# Patient Record
Sex: Female | Born: 1980 | Race: White | Hispanic: No | Marital: Married | Smoking: Never smoker
Health system: Southern US, Community
[De-identification: ages and names within clinical notes are randomized; demographics above are authoritative.]

## PROBLEM LIST (undated history)

## (undated) DIAGNOSIS — R002 Palpitations: Secondary | ICD-10-CM

## (undated) DIAGNOSIS — F419 Anxiety disorder, unspecified: Secondary | ICD-10-CM

## (undated) DIAGNOSIS — R609 Edema, unspecified: Secondary | ICD-10-CM

## (undated) DIAGNOSIS — Z91018 Allergy to other foods: Secondary | ICD-10-CM

## (undated) DIAGNOSIS — M26609 Unspecified temporomandibular joint disorder, unspecified side: Secondary | ICD-10-CM

## (undated) DIAGNOSIS — K219 Gastro-esophageal reflux disease without esophagitis: Secondary | ICD-10-CM

## (undated) DIAGNOSIS — K5900 Constipation, unspecified: Secondary | ICD-10-CM

## (undated) DIAGNOSIS — F32A Depression, unspecified: Secondary | ICD-10-CM

## (undated) DIAGNOSIS — F605 Obsessive-compulsive personality disorder: Secondary | ICD-10-CM

## (undated) DIAGNOSIS — Z6841 Body Mass Index (BMI) 40.0 and over, adult: Secondary | ICD-10-CM

## (undated) DIAGNOSIS — E559 Vitamin D deficiency, unspecified: Secondary | ICD-10-CM

## (undated) DIAGNOSIS — M222X9 Patellofemoral disorders, unspecified knee: Secondary | ICD-10-CM

## (undated) DIAGNOSIS — G43909 Migraine, unspecified, not intractable, without status migrainosus: Secondary | ICD-10-CM

## (undated) DIAGNOSIS — L811 Chloasma: Secondary | ICD-10-CM

## (undated) DIAGNOSIS — T7840XA Allergy, unspecified, initial encounter: Secondary | ICD-10-CM

## (undated) DIAGNOSIS — M722 Plantar fascial fibromatosis: Secondary | ICD-10-CM

## (undated) DIAGNOSIS — Z0000 Encounter for general adult medical examination without abnormal findings: Secondary | ICD-10-CM

## (undated) DIAGNOSIS — N189 Chronic kidney disease, unspecified: Secondary | ICD-10-CM

## (undated) DIAGNOSIS — E785 Hyperlipidemia, unspecified: Secondary | ICD-10-CM

## (undated) HISTORY — DX: Constipation, unspecified: K59.00

## (undated) HISTORY — DX: Encounter for general adult medical examination without abnormal findings: Z00.00

## (undated) HISTORY — DX: Depression, unspecified: F32.A

## (undated) HISTORY — DX: Chronic kidney disease, unspecified: N18.9

## (undated) HISTORY — DX: Obsessive-compulsive personality disorder: F60.5

## (undated) HISTORY — DX: Plantar fascial fibromatosis: M72.2

## (undated) HISTORY — DX: Anxiety disorder, unspecified: F41.9

## (undated) HISTORY — DX: Migraine, unspecified, not intractable, without status migrainosus: G43.909

## (undated) HISTORY — DX: Chloasma: L81.1

## (undated) HISTORY — DX: Morbid (severe) obesity due to excess calories: E66.01

## (undated) HISTORY — DX: Allergy to other foods: Z91.018

## (undated) HISTORY — DX: Palpitations: R00.2

## (undated) HISTORY — PX: TONSILLECTOMY: SUR1361

## (undated) HISTORY — DX: Gastro-esophageal reflux disease without esophagitis: K21.9

## (undated) HISTORY — DX: Allergy, unspecified, initial encounter: T78.40XA

## (undated) HISTORY — DX: Patellofemoral disorders, unspecified knee: M22.2X9

## (undated) HISTORY — DX: Vitamin D deficiency, unspecified: E55.9

## (undated) HISTORY — DX: Unspecified temporomandibular joint disorder, unspecified side: M26.609

## (undated) HISTORY — DX: Edema, unspecified: R60.9

## (undated) HISTORY — DX: Body Mass Index (BMI) 40.0 and over, adult: Z684

## (undated) HISTORY — PX: CERVICAL CONE BIOPSY: SUR198

## (undated) HISTORY — DX: Hyperlipidemia, unspecified: E78.5

---

## 2004-02-22 ENCOUNTER — Other Ambulatory Visit: Admission: RE | Admit: 2004-02-22 | Discharge: 2004-02-22 | Payer: Self-pay

## 2007-02-28 ENCOUNTER — Emergency Department (HOSPITAL_COMMUNITY): Admission: EM | Admit: 2007-02-28 | Discharge: 2007-02-28 | Payer: Self-pay | Admitting: Emergency Medicine

## 2008-05-13 ENCOUNTER — Encounter (INDEPENDENT_AMBULATORY_CARE_PROVIDER_SITE_OTHER): Payer: Self-pay | Admitting: Obstetrics and Gynecology

## 2008-05-13 ENCOUNTER — Ambulatory Visit (HOSPITAL_COMMUNITY): Admission: RE | Admit: 2008-05-13 | Discharge: 2008-05-13 | Payer: Self-pay | Admitting: Obstetrics and Gynecology

## 2008-06-20 ENCOUNTER — Emergency Department (HOSPITAL_COMMUNITY): Admission: EM | Admit: 2008-06-20 | Discharge: 2008-06-20 | Payer: Self-pay | Admitting: Emergency Medicine

## 2010-02-09 ENCOUNTER — Other Ambulatory Visit: Payer: Self-pay | Admitting: Obstetrics and Gynecology

## 2010-05-03 LAB — PREGNANCY, URINE: Preg Test, Ur: NEGATIVE

## 2010-05-03 LAB — URINALYSIS, ROUTINE W REFLEX MICROSCOPIC
Bilirubin Urine: NEGATIVE
Glucose, UA: NEGATIVE mg/dL
Hgb urine dipstick: NEGATIVE
Ketones, ur: NEGATIVE mg/dL
Nitrite: NEGATIVE
Protein, ur: NEGATIVE mg/dL
Specific Gravity, Urine: 1.015 (ref 1.005–1.030)
Urobilinogen, UA: 0.2 mg/dL (ref 0.0–1.0)
pH: 6.5 (ref 5.0–8.0)

## 2010-05-03 LAB — BASIC METABOLIC PANEL
BUN: 8 mg/dL (ref 6–23)
CO2: 25 mEq/L (ref 19–32)
Calcium: 8.8 mg/dL (ref 8.4–10.5)
Chloride: 103 mEq/L (ref 96–112)
Creatinine, Ser: 0.82 mg/dL (ref 0.4–1.2)
GFR calc Af Amer: >60 mL/min (ref >60)
GFR calc non Af Amer: >60 mL/min (ref >60)
Glucose, Bld: 97 mg/dL (ref 70–99)
Potassium: 4 mEq/L (ref 3.5–5.1)
Sodium: 137 mEq/L (ref 135–145)

## 2010-05-03 LAB — CBC
HCT: 40.6 % (ref 36.0–46.0)
Hemoglobin: 13.8 g/dL (ref 12.0–15.0)
MCHC: 34.1 g/dL (ref 30.0–36.0)
MCV: 85.9 fL (ref 78.0–100.0)
Platelets: 282 10*3/uL (ref 150–400)
RBC: 4.73 MIL/uL (ref 3.87–5.11)
RDW: 14 % (ref 11.5–15.5)
WBC: 7.7 10*3/uL (ref 4.0–10.5)

## 2010-05-03 LAB — PROTIME-INR
INR: 0.9 (ref 0.00–1.49)
Prothrombin Time: 12.4 seconds (ref 11.6–15.2)

## 2010-05-03 LAB — APTT: aPTT: 27 seconds (ref 24–37)

## 2010-06-06 NOTE — Op Note (Signed)
NAMEMarland Kitchen  KENNISHA, QIN NO.:  [id number]   MEDICAL RECORD NO.:  [id number]          PATIENT TYPE:  AMB   LOCATION:  SDC                           FACILITY:  [facility]   PHYSICIAN:  Miguel Aschoff, M.D.       DATE OF BIRTH:  1980-04-02   DATE OF PROCEDURE:  05/13/2008  DATE OF DISCHARGE:                               OPERATIVE REPORT   PREOPERATIVE DIAGNOSIS:  High-grade cervical intraepithelial lesion.   POSTOPERATIVE DIAGNOSIS:  High-grade cervical intraepithelial lesion.   PROCEDURE:  Cone biopsy and endocervical curettage.   SURGEON:  Miguel Aschoff, MD   ANESTHESIA:  General.   COMPLICATIONS:  None.   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  Because of this lesion, she presents now to undergo a  cone biopsy and endocervical curettage both the diagnostic and  therapeutic purposes.  The patient understands the risks and limitation  of this procedure and informed consent has been obtained.   PROCEDURE:  The patient was taken to the operating room and placed in  supine position.  General anesthesia was administered without  difficulty.  She was then placed in the dorsal lithotomy position,  prepped and draped in the usual sterile fashion.  Bladder was  catheterized.  Once this was done, a weighted speculum was placed in the  vaginal vault.  Anterior cervical lip was grasped with a tenaculum and  then figure-of-eight sutures of 0 chromic were placed from the 2 o'clock  and 4 o'clock positions and from the 8 o'clock 10 o'clock positions to  include the descending branch of the cervical arteries.  These were then  held for traction.  The cervix was then injected with 1% Xylocaine with  epinephrine for hemostasis.  After this was done, the cervix was stained  with Lugol's solution.  There was  diffuse uptake of Lugol's solution  throughout the cervix.  There were no leak areas noted, poor Lugol's  uptake.  At this point, using the knife, a cone was cut, excising the  endocervical canal in the portion of the portio, was tried to be kept  small in view of the fact that there was no ectocervical lesion was  seen.  After this was done, it was tacked at 12 o'clock position with a  single Vicryl suture.  The residual portion of the endocervical canal  was then curetted with a Kevorkian curet and sent as a separate  specimen.  At this point, the bed of the cone biopsy site was cauterized  with electrocautery as was the entire ectocervix and then the defect was  closed with a Gelfoam pack and then previously placed sutures were tied  across the cervix to hold the Gelfoam in place.  This completed the  procedure.  The estimated blood loss was minimal.  The patient tolerated  the procedure well and went to recovery room in satisfactory condition.   Plan is  for the patient to be discharged to home.   MEDICATIONS FOR HOME:  1. Darvocet-N 100 every 4 hours need for pain.  2. Doxycycline one twice a day x3 days.   She is to call for any problems such as fever, pain, or heavy bleeding.  She was instructed to have no sex for 4 weeks.  She is to call for  pathology report in 1 week.       Miguel Aschoff, M.D.  Electronically Signed     AR/MEDQ  D:  05/13/2008  T:  05/13/2008  Job:  [id number]

## 2010-08-24 ENCOUNTER — Other Ambulatory Visit: Payer: Self-pay | Admitting: Obstetrics and Gynecology

## 2010-10-13 LAB — DIFFERENTIAL
Basophils Absolute: 0
Basophils Relative: 0
Eosinophils Absolute: 0
Eosinophils Relative: 0
Lymphocytes Relative: 7 — ABNORMAL LOW
Lymphs Abs: 0.6 — ABNORMAL LOW
Monocytes Absolute: 1.1 — ABNORMAL HIGH
Monocytes Relative: 11
Neutro Abs: 8 — ABNORMAL HIGH
Neutrophils Relative %: 82 — ABNORMAL HIGH

## 2010-10-13 LAB — CBC
HCT: 42.4
Hemoglobin: 14.4
MCHC: 34
MCV: 84.5
Platelets: 283
RBC: 5.01
RDW: 13.7
WBC: 9.8

## 2010-10-13 LAB — I-STAT 8, (EC8 V) (CONVERTED LAB)
BUN: 9
Bicarbonate: 23
Chloride: 106
Glucose, Bld: 122 — ABNORMAL HIGH
HCT: 46
Hemoglobin: 15.6 — ABNORMAL HIGH
Operator id: 161631
Potassium: 4.5
Sodium: 134 — ABNORMAL LOW
TCO2: 24
pCO2, Ven: 33.6 — ABNORMAL LOW
pH, Ven: 7.443 — ABNORMAL HIGH

## 2010-10-13 LAB — URINALYSIS, ROUTINE W REFLEX MICROSCOPIC
Bilirubin Urine: NEGATIVE
Glucose, UA: NEGATIVE
Ketones, ur: 40 — AB
Nitrite: POSITIVE — AB
Protein, ur: NEGATIVE
Specific Gravity, Urine: 1.024
Urobilinogen, UA: 1
pH: 5.5

## 2010-10-13 LAB — URINE MICROSCOPIC-ADD ON

## 2010-10-13 LAB — POCT PREGNANCY, URINE
Operator id: 27065
Preg Test, Ur: NEGATIVE

## 2010-10-13 LAB — POCT I-STAT CREATININE
Creatinine, Ser: 1
Operator id: 161631

## 2012-03-16 ENCOUNTER — Encounter (HOSPITAL_BASED_OUTPATIENT_CLINIC_OR_DEPARTMENT_OTHER): Payer: Self-pay | Admitting: *Deleted

## 2012-03-16 ENCOUNTER — Emergency Department (HOSPITAL_BASED_OUTPATIENT_CLINIC_OR_DEPARTMENT_OTHER)
Admission: EM | Admit: 2012-03-16 | Discharge: 2012-03-16 | Disposition: A | Discharge status: Home / Self Care | Payer: Managed Care, Other (non HMO) | Attending: Emergency Medicine | Admitting: Emergency Medicine

## 2012-03-16 DIAGNOSIS — R112 Nausea with vomiting, unspecified: Secondary | ICD-10-CM | POA: Insufficient documentation

## 2012-03-16 DIAGNOSIS — R197 Diarrhea, unspecified: Secondary | ICD-10-CM | POA: Insufficient documentation

## 2012-03-16 DIAGNOSIS — R51 Headache: Secondary | ICD-10-CM | POA: Insufficient documentation

## 2012-03-16 DIAGNOSIS — R6883 Chills (without fever): Secondary | ICD-10-CM | POA: Insufficient documentation

## 2012-03-16 DIAGNOSIS — Z79899 Other long term (current) drug therapy: Secondary | ICD-10-CM | POA: Insufficient documentation

## 2012-03-16 MED ORDER — ONDANSETRON HCL 8 MG PO TABS
8.0000 mg | ORAL_TABLET | Freq: Once | ORAL | Status: AC
  Administered 2012-03-16: 8 mg via ORAL
  Filled 2012-03-16: qty 1

## 2012-03-16 MED ORDER — ONDANSETRON 8 MG PO TBDP: 8.0000 mg | ORAL_TABLET | Freq: Three times a day (TID) | ORAL | Status: DC | PRN

## 2012-03-16 NOTE — ED Provider Notes (Signed)
History  This chart was scribed for Hilario Quarry, MD by Shari Heritage, ED Scribe. The patient was seen in room MH06/MH06. Patient's care was started at 2130.   CSN: [id number]  Arrival date & time 03/16/12  2057   First MD Initiated Contact with Patient 03/16/12 2130      Chief Complaint  Patient presents with  . Emesis     Patient is a 32 y.o. female presenting with vomiting. The history is provided by the patient. No language interpreter was used.  Emesis Severity:  Mild Duration:  4 hours Number of daily episodes:  2 Quality:  Stomach contents Associated symptoms: chills, diarrhea and headaches   Associated symptoms: no fever      HPI Comments: Selena Flores is a 32 y.o. female who presents to the Emergency Department complaining of vomiting and diarrhea onset 2.5 hours ago. Patient states that she had 2 episodes of loose stool and 2 episodes of vomiting since 7 pm tonight. There is associated chills and mild headache. Patient denies cough, dysuria, hematuria, urinary frequency, vaginal discharge or abnormal vaginal bleeding. She reports some vaginal spotting this morning. Patient is currently taking Ortho-Cyclen birth control. She reports no pertinent past medical history. She has allergies to cipro and penicillins. Patient does not smoke.  History reviewed. No pertinent past medical history.  Past Surgical History  Procedure Laterality Date  . Tonsillectomy    . Cervical cone biopsy      No family history on file.  History  Substance Use Topics  . Smoking status: Never Smoker   . Smokeless tobacco: Not on file  . Alcohol Use: No     Comment: social     OB History   Grav Para Term Preterm Abortions TAB SAB Ect Mult Living                  Review of Systems  Constitutional: Positive for chills. Negative for fever.  Gastrointestinal: Positive for vomiting and diarrhea.  Genitourinary: Negative for dysuria, frequency, hematuria, vaginal bleeding and  vaginal discharge.  Neurological: Positive for headaches.  All other systems reviewed and are negative.    Allergies  Ciprofloxacin and Penicillins  Home Medications   Current Outpatient Rx  Name  Route  Sig  Dispense  Refill  . norgestimate-ethinyl estradiol (ORTHO-CYCLEN,SPRINTEC,PREVIFEM) 0.25-35 MG-MCG tablet   Oral   Take 1 tablet by mouth daily.         . sertraline (ZOLOFT) 100 MG tablet   Oral   Take 100 mg by mouth daily.           Triage Vitals: BP 105/73  Pulse 89  Temp(Src) 98 F (36.7 C)  Resp 18  Ht 5\' 4"  (1.626 m)  Wt 156 lb (70.761 kg)  BMI 26.76 kg/m2  SpO2 100%  LMP 02/24/2012  Physical Exam  Constitutional: She is oriented to person, place, and time. She appears well-developed and well-nourished. No distress.  HENT:  Head: Normocephalic and atraumatic.  Mouth/Throat: Oropharynx is clear and moist and mucous membranes are normal. Mucous membranes are not dry.  Eyes: Conjunctivae and EOM are normal. Pupils are equal, round, and reactive to light.  Neck: Normal range of motion. Neck supple.  Cardiovascular: Normal rate and regular rhythm.   Pulmonary/Chest: Effort normal and breath sounds normal.  Abdominal: Soft. She exhibits no distension and no mass. There is no tenderness.  Musculoskeletal: Normal range of motion.  Neurological: She is alert and oriented to person, place, and  time.  Skin: Skin is warm and dry. No rash noted.  Psychiatric: She has a normal mood and affect. Her behavior is normal.    ED Course  Procedures (including critical care time) DIAGNOSTIC STUDIES: Oxygen Saturation is 100% on room air, normal by my interpretation.    COORDINATION OF CARE: 9:40 PM- Patient informed of current plan for treatment and evaluation and agrees with plan at this time.    Labs Reviewed - No data to display  No results found.   1. Nausea vomiting and diarrhea      I personally performed the services described in this  documentation, which was scribed in my presence. The recorded information has been reviewed and considered.  MDM  Patient given zofran, no vomiting here.  Advised regarding n/v/d and instructed regarding clear liquids.   Hilario Quarry, MD 03/16/12 2245

## 2012-03-16 NOTE — ED Notes (Signed)
MD at bedside. 

## 2012-03-16 NOTE — ED Notes (Signed)
C/o vomiting and diarrhea since 7pm tonight. C/o chills. Fevers unknown. Denies any abd pain. C/o nausea.

## 2012-10-24 ENCOUNTER — Other Ambulatory Visit: Payer: Self-pay | Admitting: Obstetrics and Gynecology

## 2013-10-29 ENCOUNTER — Other Ambulatory Visit: Payer: Self-pay | Admitting: Obstetrics and Gynecology

## 2013-10-30 LAB — CYTOLOGY - PAP

## 2014-03-28 ENCOUNTER — Emergency Department (HOSPITAL_COMMUNITY)
Admission: EM | Admit: 2014-03-28 | Discharge: 2014-03-28 | Disposition: A | Discharge status: Home / Self Care | Payer: No Typology Code available for payment source | Attending: Emergency Medicine | Admitting: Emergency Medicine

## 2014-03-28 ENCOUNTER — Emergency Department (HOSPITAL_COMMUNITY): Payer: No Typology Code available for payment source

## 2014-03-28 ENCOUNTER — Encounter (HOSPITAL_COMMUNITY): Payer: Self-pay | Admitting: Emergency Medicine

## 2014-03-28 DIAGNOSIS — T07 Unspecified multiple injuries: Secondary | ICD-10-CM | POA: Insufficient documentation

## 2014-03-28 DIAGNOSIS — Y9389 Activity, other specified: Secondary | ICD-10-CM | POA: Insufficient documentation

## 2014-03-28 DIAGNOSIS — Y92410 Unspecified street and highway as the place of occurrence of the external cause: Secondary | ICD-10-CM | POA: Diagnosis not present

## 2014-03-28 DIAGNOSIS — S4991XA Unspecified injury of right shoulder and upper arm, initial encounter: Secondary | ICD-10-CM | POA: Insufficient documentation

## 2014-03-28 DIAGNOSIS — Z880 Allergy status to penicillin: Secondary | ICD-10-CM | POA: Diagnosis not present

## 2014-03-28 DIAGNOSIS — T07XXXA Unspecified multiple injuries, initial encounter: Secondary | ICD-10-CM

## 2014-03-28 DIAGNOSIS — Z79899 Other long term (current) drug therapy: Secondary | ICD-10-CM | POA: Diagnosis not present

## 2014-03-28 DIAGNOSIS — S161XXA Strain of muscle, fascia and tendon at neck level, initial encounter: Secondary | ICD-10-CM | POA: Diagnosis not present

## 2014-03-28 DIAGNOSIS — Y998 Other external cause status: Secondary | ICD-10-CM | POA: Diagnosis not present

## 2014-03-28 DIAGNOSIS — S8992XA Unspecified injury of left lower leg, initial encounter: Secondary | ICD-10-CM | POA: Insufficient documentation

## 2014-03-28 DIAGNOSIS — M79605 Pain in left leg: Secondary | ICD-10-CM

## 2014-03-28 DIAGNOSIS — M25511 Pain in right shoulder: Secondary | ICD-10-CM

## 2014-03-28 DIAGNOSIS — M542 Cervicalgia: Secondary | ICD-10-CM

## 2014-03-28 DIAGNOSIS — S199XXA Unspecified injury of neck, initial encounter: Secondary | ICD-10-CM | POA: Diagnosis present

## 2014-03-28 MED ORDER — HYDROCODONE-ACETAMINOPHEN 5-325 MG PO TABS: 1.0000 | ORAL_TABLET | Freq: Four times a day (QID) | ORAL | Status: DC | PRN

## 2014-03-28 MED ORDER — HYDROCODONE-ACETAMINOPHEN 5-325 MG PO TABS
2.0000 | ORAL_TABLET | Freq: Once | ORAL | Status: AC
  Administered 2014-03-28: 2 via ORAL
  Filled 2014-03-28: qty 2

## 2014-03-28 MED ORDER — METHOCARBAMOL 500 MG PO TABS
500.0000 mg | ORAL_TABLET | Freq: Once | ORAL | Status: AC
  Administered 2014-03-28: 500 mg via ORAL
  Filled 2014-03-28: qty 1

## 2014-03-28 MED ORDER — METHOCARBAMOL 500 MG PO TABS: 500.0000 mg | ORAL_TABLET | Freq: Two times a day (BID) | ORAL | Status: DC

## 2014-03-28 NOTE — ED Notes (Signed)
Pt denies dizziness/lightheadedness. Pt ambulated to BR with steady gait.
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
## 2014-03-28 NOTE — ED Notes (Signed)
Pt ambulated to BR with steady gait. Reported having some dizziness upon ambulation.
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
## 2014-03-28 NOTE — ED Notes (Addendum)
After pt was standing momentarily, she stated she felt like she was going to pass out. I grabbed her, she passed out, I lowered her to the ground. She woke up shortly after. Pt was able to get back in the bed. BP 80/40, laid pt down, BP came up to 97/58. States she feels a little better. Attempting to find the PA, alerted MD to situation.
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
## 2014-04-30 ENCOUNTER — Other Ambulatory Visit: Payer: Self-pay | Admitting: Obstetrics and Gynecology

## 2014-05-03 LAB — CYTOLOGY - PAP

## 2014-11-10 ENCOUNTER — Other Ambulatory Visit: Payer: Self-pay

## 2014-11-11 LAB — CYTOLOGY - PAP

## 2015-02-17 DIAGNOSIS — F419 Anxiety disorder, unspecified: Secondary | ICD-10-CM | POA: Insufficient documentation

## 2015-02-17 DIAGNOSIS — K219 Gastro-esophageal reflux disease without esophagitis: Secondary | ICD-10-CM | POA: Insufficient documentation

## 2015-02-17 DIAGNOSIS — F605 Obsessive-compulsive personality disorder: Secondary | ICD-10-CM

## 2015-02-17 DIAGNOSIS — M222X9 Patellofemoral disorders, unspecified knee: Secondary | ICD-10-CM

## 2015-02-17 DIAGNOSIS — Z6841 Body Mass Index (BMI) 40.0 and over, adult: Secondary | ICD-10-CM | POA: Insufficient documentation

## 2015-02-17 DIAGNOSIS — L811 Chloasma: Secondary | ICD-10-CM

## 2015-02-17 HISTORY — DX: Chloasma: L81.1

## 2015-02-17 HISTORY — DX: Patellofemoral disorders, unspecified knee: M22.2X9

## 2015-02-17 HISTORY — DX: Gastro-esophageal reflux disease without esophagitis: K21.9

## 2015-02-17 HISTORY — DX: Morbid (severe) obesity due to excess calories: E66.01

## 2015-02-17 HISTORY — DX: Anxiety disorder, unspecified: F41.9

## 2015-02-17 HISTORY — DX: Obsessive-compulsive personality disorder: F60.5

## 2015-04-06 ENCOUNTER — Encounter (HOSPITAL_COMMUNITY): Payer: Self-pay | Admitting: Emergency Medicine

## 2015-04-06 ENCOUNTER — Emergency Department (HOSPITAL_COMMUNITY)
Admission: EM | Admit: 2015-04-06 | Discharge: 2015-04-06 | Disposition: A | Discharge status: Home / Self Care | Payer: BLUE CROSS/BLUE SHIELD | Attending: Emergency Medicine | Admitting: Emergency Medicine

## 2015-04-06 DIAGNOSIS — R110 Nausea: Secondary | ICD-10-CM | POA: Diagnosis not present

## 2015-04-06 DIAGNOSIS — Z3202 Encounter for pregnancy test, result negative: Secondary | ICD-10-CM | POA: Insufficient documentation

## 2015-04-06 DIAGNOSIS — Z79818 Long term (current) use of other agents affecting estrogen receptors and estrogen levels: Secondary | ICD-10-CM | POA: Insufficient documentation

## 2015-04-06 DIAGNOSIS — Z79899 Other long term (current) drug therapy: Secondary | ICD-10-CM | POA: Diagnosis not present

## 2015-04-06 DIAGNOSIS — Z880 Allergy status to penicillin: Secondary | ICD-10-CM | POA: Diagnosis not present

## 2015-04-06 DIAGNOSIS — R1033 Periumbilical pain: Secondary | ICD-10-CM | POA: Diagnosis not present

## 2015-04-06 DIAGNOSIS — R197 Diarrhea, unspecified: Secondary | ICD-10-CM | POA: Insufficient documentation

## 2015-04-06 LAB — POC URINE PREG, ED: Preg Test, Ur: NEGATIVE

## 2015-04-06 LAB — COMPREHENSIVE METABOLIC PANEL
ALT: 13 U/L — ABNORMAL LOW (ref 14–54)
AST: 19 U/L (ref 15–41)
Albumin: 3.5 g/dL (ref 3.5–5.0)
Alkaline Phosphatase: 86 U/L (ref 38–126)
Anion gap: 10 (ref 5–15)
BUN: 9 mg/dL (ref 6–20)
CO2: 25 mmol/L (ref 22–32)
Calcium: 8.7 mg/dL — ABNORMAL LOW (ref 8.9–10.3)
Chloride: 102 mmol/L (ref 101–111)
Creatinine, Ser: 0.86 mg/dL (ref 0.44–1.00)
GFR calc Af Amer: >60 mL/min (ref >60)
GFR calc non Af Amer: >60 mL/min (ref >60)
Glucose, Bld: 106 mg/dL — ABNORMAL HIGH (ref 65–99)
Potassium: 3.5 mmol/L (ref 3.5–5.1)
Sodium: 137 mmol/L (ref 135–145)
Total Bilirubin: 0.7 mg/dL (ref 0.3–1.2)
Total Protein: 7.5 g/dL (ref 6.5–8.1)

## 2015-04-06 LAB — URINALYSIS, ROUTINE W REFLEX MICROSCOPIC
Glucose, UA: NEGATIVE mg/dL
Ketones, ur: 40 mg/dL — AB
Leukocytes, UA: NEGATIVE
Nitrite: NEGATIVE
Protein, ur: 30 mg/dL — AB
Specific Gravity, Urine: 1.02 (ref 1.005–1.030)
pH: 6.5 (ref 5.0–8.0)

## 2015-04-06 LAB — URINE MICROSCOPIC-ADD ON

## 2015-04-06 LAB — CBC
HCT: 46 % (ref 36.0–46.0)
Hemoglobin: 14.9 g/dL (ref 12.0–15.0)
MCH: 29.4 pg (ref 26.0–34.0)
MCHC: 32.4 g/dL (ref 30.0–36.0)
MCV: 90.7 fL (ref 78.0–100.0)
Platelets: 285 10*3/uL (ref 150–400)
RBC: 5.07 MIL/uL (ref 3.87–5.11)
RDW: 13.2 % (ref 11.5–15.5)
WBC: 10.5 10*3/uL (ref 4.0–10.5)

## 2015-04-06 LAB — LIPASE, BLOOD: Lipase: 23 U/L (ref 11–51)

## 2015-04-06 MED ORDER — ONDANSETRON HCL 4 MG PO TABS: 4.0000 mg | ORAL_TABLET | Freq: Three times a day (TID) | ORAL | Status: DC | PRN

## 2015-04-06 MED ORDER — ONDANSETRON HCL 4 MG/2ML IJ SOLN
4.0000 mg | Freq: Once | INTRAMUSCULAR | Status: AC
  Administered 2015-04-06: 4 mg via INTRAVENOUS
  Filled 2015-04-06: qty 2

## 2015-04-06 MED ORDER — SODIUM CHLORIDE 0.9 % IV BOLUS (SEPSIS)
1000.0000 mL | Freq: Once | INTRAVENOUS | Status: AC
  Administered 2015-04-06: 1000 mL via INTRAVENOUS

## 2015-04-06 MED ORDER — ONDANSETRON HCL 8 MG PO TABS: 8.0000 mg | ORAL_TABLET | Freq: Three times a day (TID) | ORAL | Status: DC | PRN

## 2015-04-06 NOTE — Discharge Instructions (Signed)
Diarrhea Diarrhea is frequent loose and watery bowel movements. It can cause you to feel weak and dehydrated. Dehydration can cause you to become tired and thirsty, have a dry mouth, and have decreased urination that often is dark yellow. Diarrhea is a sign of another problem, most often an infection that will not last long. In most cases, diarrhea typically lasts 2-3 days. However, it can last longer if it is a sign of something more serious. It is important to treat your diarrhea as directed by your caregiver to lessen or prevent future episodes of diarrhea. CAUSES  Some common causes include:  Gastrointestinal infections caused by viruses, bacteria, or parasites.  Food poisoning or food allergies.  Certain medicines, such as antibiotics, chemotherapy, and laxatives.  Artificial sweeteners and fructose.  Digestive disorders. HOME CARE INSTRUCTIONS  Ensure adequate fluid intake (hydration): Have 1 cup (8 oz) of fluid for each diarrhea episode. Avoid fluids that contain simple sugars or sports drinks, fruit juices, whole milk products, and sodas. Your urine should be clear or pale yellow if you are drinking enough fluids. Hydrate with an oral rehydration solution that you can purchase at pharmacies, retail stores, and online. You can prepare an oral rehydration solution at home by mixing the following ingredients together:   - tsp table salt.   tsp baking soda.   tsp salt substitute containing potassium chloride.  1  tablespoons sugar.  1 L (34 oz) of water.  Certain foods and beverages may increase the speed at which food moves through the gastrointestinal (GI) tract. These foods and beverages should be avoided and include:  Caffeinated and alcoholic beverages.  High-fiber foods, such as raw fruits and vegetables, nuts, seeds, and whole grain breads and cereals.  Foods and beverages sweetened with sugar alcohols, such as xylitol, sorbitol, and mannitol.  Some foods may be well  tolerated and may help thicken stool including:  Starchy foods, such as rice, toast, pasta, low-sugar cereal, oatmeal, grits, baked potatoes, crackers, and bagels.  Bananas.  Applesauce.  Add probiotic-rich foods to help increase healthy bacteria in the GI tract, such as yogurt and fermented milk products.  Wash your hands well after each diarrhea episode.  Only take over-the-counter or prescription medicines as directed by your caregiver.  Take a warm bath to relieve any burning or pain from frequent diarrhea episodes. SEEK IMMEDIATE MEDICAL CARE IF:   You are unable to keep fluids down.  You have persistent vomiting.  You have blood in your stool, or your stools are black and tarry.  You do not urinate in 6-8 hours, or there is only a small amount of very dark urine.  You have abdominal pain that increases or localizes.  You have weakness, dizziness, confusion, or light-headedness.  You have a severe headache.  Your diarrhea gets worse or does not get better.  You have a fever or persistent symptoms for more than 2-3 days.  You have a fever and your symptoms suddenly get worse. MAKE SURE YOU:   Understand these instructions.  Will watch your condition.  Will get help right away if you are not doing well or get worse.   This information is not intended to replace advice given to you by your health care provider. Make sure you discuss any questions you have with your health care provider.   Document Released: 12/29/2001 Document Revised: 01/29/2014 Document Reviewed: 09/16/2011 Elsevier Interactive Patient Education 2016 [employer]   Food Choices to Help Relieve Diarrhea, Adult When you have   diarrhea, the foods you eat and your eating habits are very important. Choosing the right foods and drinks can help relieve diarrhea. Also, because diarrhea can last up to 7 days, you need to replace lost fluids and electrolytes (such as sodium, potassium, and chloride) in  order to help prevent dehydration.  WHAT GENERAL GUIDELINES DO I NEED TO FOLLOW?  Slowly drink 1 cup (8 oz) of fluid for each episode of diarrhea. If you are getting enough fluid, your urine will be clear or pale yellow.  Eat starchy foods. Some good choices include white rice, white toast, pasta, low-fiber cereal, baked potatoes (without the skin), saltine crackers, and bagels.  Avoid large servings of any cooked vegetables.  Limit fruit to two servings per day. A serving is  cup or 1 small piece.  Choose foods with less than 2 g of fiber per serving.  Limit fats to less than 8 tsp (38 g) per day.  Avoid fried foods.  Eat foods that have probiotics in them. Probiotics can be found in certain dairy products.  Avoid foods and beverages that may increase the speed at which food moves through the stomach and intestines (gastrointestinal tract). Things to avoid include:  High-fiber foods, such as dried fruit, raw fruits and vegetables, nuts, seeds, and whole grain foods.  Spicy foods and high-fat foods.  Foods and beverages sweetened with high-fructose corn syrup, honey, or sugar alcohols such as xylitol, sorbitol, and mannitol. WHAT FOODS ARE RECOMMENDED? Grains White rice. White, French, or pita breads (fresh or toasted), including plain rolls, buns, or bagels. White pasta. Saltine, soda, or graham crackers. Pretzels. Low-fiber cereal. Cooked cereals made with water (such as cornmeal, farina, or cream cereals). Plain muffins. Matzo. Melba toast. Zwieback.  Vegetables Potatoes (without the skin). Strained tomato and vegetable juices. Most well-cooked and canned vegetables without seeds. Tender lettuce. Fruits Cooked or canned applesauce, apricots, cherries, fruit cocktail, grapefruit, peaches, pears, or plums. Fresh bananas, apples without skin, cherries, grapes, cantaloupe, grapefruit, peaches, oranges, or plums.  Meat and Other Protein Products Baked or boiled chicken. Eggs. Tofu.  Fish. Seafood. Smooth peanut butter. Ground or well-cooked tender beef, ham, veal, lamb, pork, or poultry.  Dairy Plain yogurt, kefir, and unsweetened liquid yogurt. Lactose-free milk, buttermilk, or soy milk. Plain hard cheese. Beverages Sport drinks. Clear broths. Diluted fruit juices (except prune). Regular, caffeine-free sodas such as ginger ale. Water. Decaffeinated teas. Oral rehydration solutions. Sugar-free beverages not sweetened with sugar alcohols. Other Bouillon, broth, or soups made from recommended foods.  The items listed above may not be a complete list of recommended foods or beverages. Contact your dietitian for more options. WHAT FOODS ARE NOT RECOMMENDED? Grains Whole grain, whole wheat, bran, or rye breads, rolls, pastas, crackers, and cereals. Wild or brown rice. Cereals that contain more than 2 g of fiber per serving. Corn tortillas or taco shells. Cooked or dry oatmeal. Granola. Popcorn. Vegetables Raw vegetables. Cabbage, broccoli, Brussels sprouts, artichokes, baked beans, beet greens, corn, kale, legumes, peas, sweet potatoes, and yams. Potato skins. Cooked spinach and cabbage. Fruits Dried fruit, including raisins and dates. Raw fruits. Stewed or dried prunes. Fresh apples with skin, apricots, mangoes, pears, raspberries, and strawberries.  Meat and Other Protein Products Chunky peanut butter. Nuts and seeds. Beans and lentils. Bacon.  Dairy High-fat cheeses. Milk, chocolate milk, and beverages made with milk, such as milk shakes. Cream. Ice cream. Sweets and Desserts Sweet rolls, doughnuts, and sweet breads. Pancakes and waffles. Fats and Oils Butter. Cream sauces.   Margarine. Salad oils. Plain salad dressings. Olives. Avocados.  Beverages Caffeinated beverages (such as coffee, tea, soda, or energy drinks). Alcoholic beverages. Fruit juices with pulp. Prune juice. Soft drinks sweetened with high-fructose corn syrup or sugar alcohols. Other Coconut. Hot sauce.  Chili powder. Mayonnaise. Gravy. Cream-based or milk-based soups.  The items listed above may not be a complete list of foods and beverages to avoid. Contact your dietitian for more information. WHAT SHOULD I DO IF I BECOME DEHYDRATED? Diarrhea can sometimes lead to dehydration. Signs of dehydration include dark urine and dry mouth and skin. If you think you are dehydrated, you should rehydrate with an oral rehydration solution. These solutions can be purchased at pharmacies, retail stores, or online.  Drink -1 cup (120-240 mL) of oral rehydration solution each time you have an episode of diarrhea. If drinking this amount makes your diarrhea worse, try drinking smaller amounts more often. For example, drink 1-3 tsp (5-15 mL) every 5-10 minutes.  A general rule for staying hydrated is to drink 1-2 L of fluid per day. Talk to your health care provider about the specific amount you should be drinking each day. Drink enough fluids to keep your urine clear or pale yellow.   This information is not intended to replace advice given to you by your health care provider. Make sure you discuss any questions you have with your health care provider.   Document Released: 03/31/2003 Document Revised: 01/29/2014 Document Reviewed: 12/01/2012 Elsevier Interactive Patient Education 2016 Elsevier Inc.   

## 2015-04-06 NOTE — ED Notes (Signed)
Per pt, [facility]-was diagnosed with a virus-yesterday states she was feeling a little better but by afternoon she was running a fever and diarrhea

## 2015-04-06 NOTE — ED Provider Notes (Signed)
CSN: [id number]     Arrival date & time 04/06/15  1036 History   First MD Initiated Contact with Patient 04/06/15 1507     Chief Complaint  Patient presents with  . Diarrhea     (Consider location/radiation/quality/duration/timing/severity/associated sxs/prior Treatment) HPI   Selena Flores is a 35 y.o. female who presents to the emergency room with 4 days of intermittent diarrhea with associated abdominal cramps, nausea and intermittent fever.  Her symptoms first began Saturday morning and minimally improved until Sunday night when it worsened. She estimates 4-5 bowel movements per day, but possibly up to 10 times per day. Stool is described as watery diarrhea, brown to light brown. She denies melena, hematochezia, rectal pain, tenesmus.  Her abdominal pain is intermittent, located in her central abdomen without radiation, rated 9 out of 10 at its worse, improved with lying down and improved after having bowel movements. She had fevers days ago, but none today.  She denies recent travel, suspicious food intake, sick contacts. She was seen at urgent care 2 days ago, was diagnosed with a GI virus. She has used Pepto-Bismol and Imodium with brief relief however a friend who is a Char[employer] fundraiserN told her that she may not want to use medication and she stopped. Her husband brought her to the ER today because of worsening abdominal cramps.  She denies dysuria, hematuria, urinary frequency or urgency. She denies any vaginal discharge, vaginal irritation or possibility of STDs. She denies flank pain, sweats, chills, vomiting, chest pain, shortness of breath, lightheadedness, headache.  She is on oral birth control, does not believe she can be pregnant, LMP estimated 03/23/2015.   No other acute complaints.  History reviewed. No pertinent past medical history. Past Surgical History  Procedure Laterality Date  . Tonsillectomy    . Cervical cone biopsy     No family history on file. Social History  Substance  Use Topics  . Smoking status: Never Smoker   . Smokeless tobacco: None  . Alcohol Use: No     Comment: social    OB History    No data available     Review of Systems  Constitutional: Negative for chills, diaphoresis, activity change, appetite change and fatigue.  HENT: Negative.   Respiratory: Negative.   Cardiovascular: Negative.   Gastrointestinal: Positive for nausea and diarrhea. Negative for vomiting, constipation, blood in stool, abdominal distention, anal bleeding and rectal pain.  Genitourinary: Negative.   Skin: Negative.  Negative for rash.  Neurological: Negative for dizziness, tremors, syncope, weakness and light-headedness.  All other systems reviewed and are negative.     Allergies  Ciprofloxacin; Rocephin; and Penicillins  Home Medications   Prior to Admission medications   Medication Sig Start Date End Date Taking? Authorizing Provider  acetaminophen (TYLENOL) 325 MG tablet Take 650 mg by mouth every 6 (six) hours as needed for fever.   Yes Historical Provider, MD  aspirin-acetaminophen-caffeine (EXCEDRIN MIGRAINE) [phone] MG per tablet Take 2 tablets by mouth every 6 (six) hours as needed for headache.   Yes Historical Provider, MD  bismuth subsalicylate (PEPTO BISMOL) 262 MG/15ML suspension Take 30 mLs by mouth every 6 (six) hours as needed for diarrhea or loose stools.   Yes Historical Provider, MD  loperamide (IMODIUM) 2 MG capsule Take 2-4 mg by mouth as needed for diarrhea or loose stools.   Yes Historical Provider, MD  Norgestimate-Ethinyl Estradiol Triphasic (TRI-SPRINTEC) 0.18/0.215/0.25 MG-35 MCG tablet Take 1 tablet by mouth daily.   Yes Historical Provider, MD  sertraline (ZOLOFT) 100 MG tablet Take 100 mg by mouth daily.   Yes Historical Provider, MD  ondansetron (ZOFRAN) 4 MG tablet Take 1 tablet (4 mg total) by mouth every 8 (eight) hours as needed for nausea or vomiting. 04/06/15   Danelle Berry, PA-C   BP 123/79 mmHg  Pulse 77  Temp(Src) 98.2  F (36.8 C) (Oral)  Resp 20  SpO2 99%  LMP 03/23/2015 Physical Exam  Constitutional: She is oriented to person, place, and time. She appears well-developed and well-nourished. No distress.  HENT:  Head: Normocephalic and atraumatic.  Nose: Nose normal.  Mouth/Throat: Oropharynx is clear and moist. No oropharyngeal exudate.  Eyes: Conjunctivae and EOM are normal. Pupils are equal, round, and reactive to light. Right eye exhibits no discharge. Left eye exhibits no discharge. No scleral icterus.  Neck: Normal range of motion. No JVD present. No tracheal deviation present. No thyromegaly present.  Cardiovascular: Normal rate, regular rhythm, normal heart sounds and intact distal pulses.  Exam reveals no gallop and no friction rub.   No murmur heard. Pulmonary/Chest: Effort normal and breath sounds normal. No respiratory distress. She has no wheezes. She has no rales. She exhibits no tenderness.  Abdominal: Soft. Bowel sounds are normal. She exhibits no distension and no mass. There is tenderness. There is no rebound and no guarding.  Periumbilical tenderness to palpation without rebound or guarding. No CVA tenderness  Musculoskeletal: Normal range of motion. She exhibits no edema or tenderness.  Lymphadenopathy:    She has no cervical adenopathy.  Neurological: She is alert and oriented to person, place, and time. She has normal reflexes. No cranial nerve deficit. She exhibits normal muscle tone. Coordination normal.  Skin: Skin is warm and dry. No rash noted. She is not diaphoretic. No erythema. No pallor.  Psychiatric: She has a normal mood and affect. Her behavior is normal. Judgment and thought content normal.  Nursing note and vitals reviewed.   ED Course  Procedures (including critical care time) Labs Review Labs Reviewed  COMPREHENSIVE METABOLIC PANEL - Abnormal; Notable for the following:    Glucose, Bld 106 (*)    Calcium 8.7 (*)    ALT 13 (*)    All other components within  normal limits  URINALYSIS, ROUTINE W REFLEX MICROSCOPIC (NOT AT [facility]) - Abnormal; Notable for the following:    Hgb urine dipstick SMALL (*)    Bilirubin Urine MODERATE (*)    Ketones, ur 40 (*)    Protein, ur 30 (*)    All other components within normal limits  URINE MICROSCOPIC-ADD ON - Abnormal; Notable for the following:    Squamous Epithelial / LPF 0-5 (*)    Bacteria, UA FEW (*)    All other components within normal limits  LIPASE, BLOOD  CBC  POC URINE PREG, ED    Imaging Review No results found. I have personally reviewed and evaluated these images and lab results as part of my medical decision-making.   EKG Interpretation None      MDM   Patient with 4 days of diarrhea, presented to the emergency room for worsening abdominal cramps today with concerns for dehydration.  She denies weakness, fatigue, near syncope, chest pain, shortness of breath.  Basic labs were obtained and resulted prior to my evaluation, labs unremarkable. Vital signs stable with mildly elevated heart rate 106.  She has been in the ER for approximately 5 hours and has been unable to provide a urine sample. We will give IV fluids,  obtain urinalysis and urine pregnancy test.  Do not suspect acute abdomen, pt is well appearing, abdomen soft, w/o peritoneal signs.  Anticipate D/C home after UA results. 3:48 PM  Pt's UA resulted, negative for UTI, with -nitrite, -leukocytes.  Pt received IVF and will be d/c home.  Return precautions reviewed.    Final diagnoses:  Diarrhea, unspecified type     Danelle Berry, PA-C 04/09/15 0119  Mancel Bale, MD 04/11/15 2148

## 2015-11-18 ENCOUNTER — Other Ambulatory Visit: Payer: Self-pay | Admitting: Obstetrics and Gynecology

## 2015-11-18 DIAGNOSIS — Z6841 Body Mass Index (BMI) 40.0 and over, adult: Secondary | ICD-10-CM | POA: Diagnosis not present

## 2015-11-18 DIAGNOSIS — Z124 Encounter for screening for malignant neoplasm of cervix: Secondary | ICD-10-CM | POA: Diagnosis not present

## 2015-11-18 DIAGNOSIS — Z01419 Encounter for gynecological examination (general) (routine) without abnormal findings: Secondary | ICD-10-CM | POA: Diagnosis not present

## 2015-11-21 LAB — CYTOLOGY - PAP

## 2016-04-10 DIAGNOSIS — Z0000 Encounter for general adult medical examination without abnormal findings: Secondary | ICD-10-CM | POA: Diagnosis not present

## 2016-04-12 DIAGNOSIS — F605 Obsessive-compulsive personality disorder: Secondary | ICD-10-CM | POA: Diagnosis not present

## 2016-04-12 DIAGNOSIS — Z6841 Body Mass Index (BMI) 40.0 and over, adult: Secondary | ICD-10-CM | POA: Diagnosis not present

## 2016-04-12 DIAGNOSIS — Z0000 Encounter for general adult medical examination without abnormal findings: Secondary | ICD-10-CM | POA: Diagnosis not present

## 2016-06-06 DIAGNOSIS — M7661 Achilles tendinitis, right leg: Secondary | ICD-10-CM | POA: Diagnosis not present

## 2016-06-06 DIAGNOSIS — M7731 Calcaneal spur, right foot: Secondary | ICD-10-CM | POA: Diagnosis not present

## 2017-02-07 DIAGNOSIS — Z01419 Encounter for gynecological examination (general) (routine) without abnormal findings: Secondary | ICD-10-CM | POA: Diagnosis not present

## 2017-02-07 DIAGNOSIS — Z124 Encounter for screening for malignant neoplasm of cervix: Secondary | ICD-10-CM | POA: Diagnosis not present

## 2017-02-07 DIAGNOSIS — R87610 Atypical squamous cells of undetermined significance on cytologic smear of cervix (ASC-US): Secondary | ICD-10-CM | POA: Diagnosis not present

## 2017-02-07 DIAGNOSIS — Z6841 Body Mass Index (BMI) 40.0 and over, adult: Secondary | ICD-10-CM | POA: Diagnosis not present

## 2017-05-28 ENCOUNTER — Telehealth: Payer: Self-pay | Admitting: General Practice

## 2017-05-28 ENCOUNTER — Telehealth: Payer: Self-pay | Admitting: Family Medicine

## 2017-05-28 NOTE — Telephone Encounter (Signed)
Please advise 

## 2017-05-28 NOTE — Telephone Encounter (Unsigned)
Copied from CRM [id number]. Topic: Quick Communication - See Telephone Encounter >> May 28, 2017  1:59 PM Selena Flores wrote: Pt is wanting to see dr Abner Greenspan and would like to know she would be able to take her own as a pt   [phone]

## 2017-05-28 NOTE — Telephone Encounter (Signed)
Called pt regarding her request to establish new care with our office.LVM informing her that we have four providers accepting new pts and advised to call back and schedule a new patient appt.

## 2017-05-28 NOTE — Telephone Encounter (Signed)
I willing to take her on as a patient

## 2017-05-30 NOTE — Telephone Encounter (Signed)
Called patient left vmail for her to call the office back.

## 2017-07-09 ENCOUNTER — Ambulatory Visit (INDEPENDENT_AMBULATORY_CARE_PROVIDER_SITE_OTHER): Payer: BLUE CROSS/BLUE SHIELD | Admitting: Family Medicine

## 2017-07-09 ENCOUNTER — Encounter: Payer: Self-pay | Admitting: Family Medicine

## 2017-07-09 DIAGNOSIS — E785 Hyperlipidemia, unspecified: Secondary | ICD-10-CM

## 2017-07-09 DIAGNOSIS — F419 Anxiety disorder, unspecified: Secondary | ICD-10-CM | POA: Diagnosis not present

## 2017-07-09 MED ORDER — SERTRALINE HCL 25 MG PO TABS: 25.0000 mg | ORAL_TABLET | Freq: Every day | ORAL | 1 refills | Status: DC

## 2017-07-09 NOTE — Progress Notes (Signed)
Subjective:  I acted as a Neurosurgeon for Dr. Abner Greenspan. [place], [place]  Patient ID: Selena Flores, female    DOB: [birth year], 37 y.o.   MRN: [id number]  No chief complaint on file.   HPI  Patient is in today to establish care and follow up on her chronic medical concerns including hyperlipidemia, anxiety and obesity. She feels well today. No recent febrile illness or hospitalizations. She takes the Sertraline for anxiety and she has dropped it to 50 mg daily and has done well  Her previous marriage was very stressful but her new relationship is much more supportive and she does not feel she needs the medication any longer. She is frustrated with her weight and has lost over 100 pounds previously. Denies CP/palp/SOB/HA/congestion/fevers/GI or GU c/o. Taking meds as prescribed  Patient Care Team: Bradd Canary, MD as PCP - General (Family Medicine)   Past Medical History:  Diagnosis Date  . Anxiety   . Chronic kidney disease    kidney infection 2008 ish  . Hyperlipidemia     Past Surgical History:  Procedure Laterality Date  . CERVICAL CONE BIOPSY    . TONSILLECTOMY      Family History  Problem Relation Age of Onset  . Heart disease Mother        chf  . Obesity Mother   . Skin cancer Father   . Cancer Father        skin cancer    Social History   Socioeconomic History  . Marital status: Divorced    Spouse name: Not on file  . Number of children: 0  . Years of education: Not on file  . Highest education level: Not on file  Occupational History  . Occupation: [employer]     Comment: Customer Service   Social Needs  . Financial resource strain: Not on file  . Food insecurity:    Worry: Not on file    Inability: Not on file  . Transportation needs:    Medical: Not on file    Non-medical: Not on file  Tobacco Use  . Smoking status: Never Smoker  . Smokeless tobacco: Never Used  Substance and Sexual Activity  . Alcohol use: Yes    Comment: social   . Drug  use: Yes  . Sexual activity: Yes  Lifestyle  . Physical activity:    Days per week: Not on file    Minutes per session: Not on file  . Stress: Not on file  Relationships  . Social connections:    Talks on phone: Not on file    Gets together: Not on file    Attends religious service: Not on file    Active member of club or organization: Not on file    Attends meetings of clubs or organizations: Not on file    Relationship status: Not on file  . Intimate partner violence:    Fear of current or ex partner: Not on file    Emotionally abused: Not on file    Physically abused: Not on file    Forced sexual activity: Not on file  Other Topics Concern  . Not on file  Social History Narrative   Lives with boyfriend, has 3 dogs, works in Clinical biochemist with [employer]   No dietary restrictions, walking    Outpatient Medications Prior to Visit  Medication Sig Dispense Refill  . acetaminophen (TYLENOL) 325 MG tablet Take 650 mg by mouth every 6 (six) hours as needed  for fever.    Marland Kitchen. aspirin-acetaminophen-caffeine (EXCEDRIN MIGRAINE) 250-250-65 MG per tablet Take 2 tablets by mouth every 6 (six) hours as needed for headache.    . bismuth subsalicylate (PEPTO BISMOL) 262 MG/15ML suspension Take 30 mLs by mouth every 6 (six) hours as needed for diarrhea or loose stools.    Marland Kitchen. loperamide (IMODIUM) 2 MG capsule Take 2-4 mg by mouth as needed for diarrhea or loose stools.    . Norgestimate-Ethinyl Estradiol Triphasic (TRI-SPRINTEC) 0.18/0.215/0.25 MG-35 MCG tablet Take 1 tablet by mouth daily.    . ondansetron (ZOFRAN) 4 MG tablet Take 1 tablet (4 mg total) by mouth every 8 (eight) hours as needed for nausea or vomiting. 10 tablet 0  . sertraline (ZOLOFT) 100 MG tablet Take 100 mg by mouth daily.    . Norgestimate-Ethinyl Estradiol Triphasic 0.18/0.215/0.25 MG-35 MCG tablet Take 1 tablet by mouth daily.     No facility-administered medications prior to visit.     Allergies  Allergen  Reactions  . Ciprofloxacin Itching  . Rocephin [Ceftriaxone] Itching  . Penicillins Itching and Rash    Has patient had a PCN reaction causing immediate rash, facial/tongue/throat swelling, SOB or lightheadedness with hypotension: Unknown Has patient had a PCN reaction causing severe rash involving mucus membranes or skin necrosis: Unknown Has patient had a PCN reaction that required hospitalization Unknown Has patient had a PCN reaction occurring within the last 10 years: Yes If all of the above answers are "NO", then may proceed with Cephalosporin use.     Review of Systems  Constitutional: Positive for malaise/fatigue. Negative for chills and fever.  HENT: Negative for congestion and hearing loss.   Eyes: Negative for discharge.  Respiratory: Negative for cough, sputum production and shortness of breath.   Cardiovascular: Negative for chest pain, palpitations and leg swelling.  Gastrointestinal: Negative for abdominal pain, blood in stool, constipation, diarrhea, heartburn, nausea and vomiting.  Genitourinary: Negative for dysuria, frequency, hematuria and urgency.  Musculoskeletal: Negative for back pain, falls and myalgias.  Skin: Negative for rash.  Neurological: Negative for dizziness, sensory change, loss of consciousness, weakness and headaches.  Endo/Heme/Allergies: Negative for environmental allergies. Does not bruise/bleed easily.  Psychiatric/Behavioral: Negative for depression and suicidal ideas. The patient is nervous/anxious. The patient does not have insomnia.        Objective:    Physical Exam  Constitutional: She is oriented to person, place, and time. No distress.  HENT:  Head: Normocephalic and atraumatic.  Right Ear: External ear normal.  Left Ear: External ear normal.  Nose: Nose normal.  Mouth/Throat: Oropharynx is clear and moist. No oropharyngeal exudate.  Eyes: Pupils are equal, round, and reactive to light. Conjunctivae are normal. Right eye exhibits  no discharge. Left eye exhibits no discharge. No scleral icterus.  Neck: Normal range of motion. Neck supple. No thyromegaly present.  Cardiovascular: Normal rate, regular rhythm, normal heart sounds and intact distal pulses.  No murmur heard. Pulmonary/Chest: Effort normal and breath sounds normal. No respiratory distress. She has no wheezes. She has no rales.  Abdominal: Soft. Bowel sounds are normal. She exhibits no distension and no mass. There is no tenderness.  Musculoskeletal: Normal range of motion. She exhibits no edema or tenderness.  Lymphadenopathy:    She has no cervical adenopathy.  Neurological: She is alert and oriented to person, place, and time. She has normal reflexes. She displays normal reflexes. No cranial nerve deficit. Coordination normal.  Skin: Skin is warm and dry. No rash noted. She is  not diaphoretic.    BP 120/82 (BP Location: Left Arm, Patient Position: Sitting, Cuff Size: Large)   Pulse 72   Temp 98.2 F (36.8 C) (Oral)   Resp 18   Ht 5\' 4"  (1.626 m)   Wt 277 lb 6.4 oz (125.8 kg)   LMP 06/18/2017   SpO2 98%   BMI 47.62 kg/m  Wt Readings from Last 3 Encounters:  07/09/17 277 lb 6.4 oz (125.8 kg)  03/16/12 156 lb (70.8 kg)   BP Readings from Last 3 Encounters:  07/09/17 120/82  04/06/15 123/79  03/28/14 117/80      There is no immunization history on file for this patient.  Health Maintenance  Topic Date Due  . HIV Screening  11/21/1997  . INFLUENZA VACCINE  08/22/2017  . PAP SMEAR  11/18/2018  . TETANUS/TDAP  12/23/2019    Lab Results  Component Value Date   WBC 10.5 04/06/2015   HGB 14.9 04/06/2015   HCT 46.0 04/06/2015   PLT 285 04/06/2015   GLUCOSE 106 (H) 04/06/2015   ALT 13 (L) 04/06/2015   AST 19 04/06/2015   NA 137 04/06/2015   K 3.5 04/06/2015   CL 102 04/06/2015   CREATININE 0.86 04/06/2015   BUN 9 04/06/2015   CO2 25 04/06/2015   INR 0.9 05/12/2008    No results found for: TSH Lab Results  Component Value Date    WBC 10.5 04/06/2015   HGB 14.9 04/06/2015   HCT 46.0 04/06/2015   MCV 90.7 04/06/2015   PLT 285 04/06/2015   Lab Results  Component Value Date   NA 137 04/06/2015   K 3.5 04/06/2015   CO2 25 04/06/2015   GLUCOSE 106 (H) 04/06/2015   BUN 9 04/06/2015   CREATININE 0.86 04/06/2015   BILITOT 0.7 04/06/2015   ALKPHOS 86 04/06/2015   AST 19 04/06/2015   ALT 13 (L) 04/06/2015   PROT 7.5 04/06/2015   ALBUMIN 3.5 04/06/2015   CALCIUM 8.7 (L) 04/06/2015   ANIONGAP 10 04/06/2015   No results found for: CHOL No results found for: HDL No results found for: LDLCALC No results found for: TRIG No results found for: CHOLHDL No results found for: MWUX3K       Assessment & Plan:   Problem List Items Addressed This Visit    Hyperlipidemia    Encouraged heart healthy diet, increase exercise, avoid trans fats, consider a krill oil cap daily      Anxiety    She has only been taking Sertraline at 50 mg daily will drop to 25 daily for a month then 12.5 mg daily for 2 weeks and stop if she tolerates taper.       Relevant Medications   sertraline (ZOLOFT) 25 MG tablet   Morbid obesity (HCC) - Primary    Encouraged DASH diet, decrease po intake and increase exercise as tolerated. Needs 7-8 hours of sleep nightly. Avoid trans fats, eat small, frequent meals every 4-5 hours with lean proteins, complex carbs and healthy fats. Minimize simple carbs, referral to bariatric service      Relevant Orders   Amb Ref to Medical Weight Management      I have discontinued Kamarii L. Wooten's sertraline, aspirin-acetaminophen-caffeine, acetaminophen, loperamide, bismuth subsalicylate, and ondansetron. I am also having her start on sertraline. Additionally, I am having her maintain her Norgestimate-Ethinyl Estradiol Triphasic.  Meds ordered this encounter  Medications  . sertraline (ZOLOFT) 25 MG tablet    Sig: Take 1 tablet (25 mg  total) by mouth daily.    Dispense:  30 tablet    Refill:  1     CMA served as scribe during this visit. History, Physical and Plan performed by medical provider. Documentation and orders reviewed and attested to.  Danise Edge, MD

## 2017-07-09 NOTE — Patient Instructions (Signed)
Preventive Care 18-39 Years, Female Preventive care refers to lifestyle choices and visits with your health care provider that can promote health and wellness. What does preventive care include?  A yearly physical exam. This is also called an annual well check.  Dental exams once or twice a year.  Routine eye exams. Ask your health care provider how often you should have your eyes checked.  Personal lifestyle choices, including: ? Daily care of your teeth and gums. ? Regular physical activity. ? Eating a healthy diet. ? Avoiding tobacco and drug use. ? Limiting alcohol use. ? Practicing safe sex. ? Taking vitamin and mineral supplements as recommended by your health care provider. What happens during an annual well check? The services and screenings done by your health care provider during your annual well check will depend on your age, overall health, lifestyle risk factors, and family history of disease. Counseling Your health care provider may ask you questions about your:  Alcohol use.  Tobacco use.  Drug use.  Emotional well-being.  Home and relationship well-being.  Sexual activity.  Eating habits.  Work and work Statistician.  Method of birth control.  Menstrual cycle.  Pregnancy history.  Screening You may have the following tests or measurements:  Height, weight, and BMI.  Diabetes screening. This is done by checking your blood sugar (glucose) after you have not eaten for a while (fasting).  Blood pressure.  Lipid and cholesterol levels. These may be checked every 5 years starting at age 66.  Skin check.  Hepatitis C blood test.  Hepatitis B blood test.  Sexually transmitted disease (STD) testing.  BRCA-related cancer screening. This may be done if you have a family history of breast, ovarian, tubal, or peritoneal cancers.  Pelvic exam and Pap test. This may be done every 3 years starting at age 40. Starting at age 59, this may be done every 5  years if you have a Pap test in combination with an HPV test.  Discuss your test results, treatment options, and if necessary, the need for more tests with your health care provider. Vaccines Your health care provider may recommend certain vaccines, such as:  Influenza vaccine. This is recommended every year.  Tetanus, diphtheria, and acellular pertussis (Tdap, Td) vaccine. You may need a Td booster every 10 years.  Varicella vaccine. You may need this if you have not been vaccinated.  HPV vaccine. If you are 69 or younger, you may need three doses over 6 months.  Measles, mumps, and rubella (MMR) vaccine. You may need at least one dose of MMR. You may also need a second dose.  Pneumococcal 13-valent conjugate (PCV13) vaccine. You may need this if you have certain conditions and were not previously vaccinated.  Pneumococcal polysaccharide (PPSV23) vaccine. You may need one or two doses if you smoke cigarettes or if you have certain conditions.  Meningococcal vaccine. One dose is recommended if you are age 27-21 years and a first-year college student living in a residence hall, or if you have one of several medical conditions. You may also need additional booster doses.  Hepatitis A vaccine. You may need this if you have certain conditions or if you travel or work in places where you may be exposed to hepatitis A.  Hepatitis B vaccine. You may need this if you have certain conditions or if you travel or work in places where you may be exposed to hepatitis B.  Haemophilus influenzae type b (Hib) vaccine. You may need this if  you have certain risk factors.  Talk to your health care provider about which screenings and vaccines you need and how often you need them. This information is not intended to replace advice given to you by your health care provider. Make sure you discuss any questions you have with your health care provider. Document Released: 03/06/2001 Document Revised: 09/28/2015  Document Reviewed: 11/09/2014 Elsevier Interactive Patient Education  [employer].

## 2017-07-10 ENCOUNTER — Encounter: Payer: Self-pay | Admitting: Family Medicine

## 2017-07-10 DIAGNOSIS — E785 Hyperlipidemia, unspecified: Secondary | ICD-10-CM | POA: Insufficient documentation

## 2017-07-10 HISTORY — DX: Morbid (severe) obesity due to excess calories: E66.01

## 2017-07-10 NOTE — Assessment & Plan Note (Signed)
She has only been taking Sertraline at 50 mg daily will drop to 25 daily for a month then 12.5 mg daily for 2 weeks and stop if she tolerates taper.

## 2017-07-10 NOTE — Assessment & Plan Note (Signed)
Encouraged heart healthy diet, increase exercise, avoid trans fats, consider a krill oil cap daily 

## 2017-07-10 NOTE — Assessment & Plan Note (Signed)
Encouraged DASH diet, decrease po intake and increase exercise as tolerated. Needs 7-8 hours of sleep nightly. Avoid trans fats, eat small, frequent meals every 4-5 hours with lean proteins, complex carbs and healthy fats. Minimize simple carbs, referral to bariatric service

## 2017-07-12 ENCOUNTER — Telehealth: Payer: Self-pay | Admitting: *Deleted

## 2017-07-12 NOTE — Telephone Encounter (Signed)
[facility]; forwarded to provider/SLS 06/21

## 2017-08-02 DIAGNOSIS — Z6841 Body Mass Index (BMI) 40.0 and over, adult: Secondary | ICD-10-CM | POA: Diagnosis not present

## 2017-08-02 DIAGNOSIS — Z3040 Encounter for surveillance of contraceptives, unspecified: Secondary | ICD-10-CM | POA: Diagnosis not present

## 2017-10-03 ENCOUNTER — Encounter (INDEPENDENT_AMBULATORY_CARE_PROVIDER_SITE_OTHER): Payer: Self-pay

## 2017-10-10 ENCOUNTER — Encounter (INDEPENDENT_AMBULATORY_CARE_PROVIDER_SITE_OTHER): Payer: Self-pay | Admitting: Family Medicine

## 2017-10-10 ENCOUNTER — Ambulatory Visit (INDEPENDENT_AMBULATORY_CARE_PROVIDER_SITE_OTHER): Payer: BLUE CROSS/BLUE SHIELD | Admitting: Family Medicine

## 2017-10-10 VITALS — BP 129/80 | HR 75 | Temp 98.0°F | Ht 63.0 in | Wt 266.0 lb

## 2017-10-10 DIAGNOSIS — Z1331 Encounter for screening for depression: Secondary | ICD-10-CM | POA: Diagnosis not present

## 2017-10-10 DIAGNOSIS — Z0289 Encounter for other administrative examinations: Secondary | ICD-10-CM

## 2017-10-10 DIAGNOSIS — Z6841 Body Mass Index (BMI) 40.0 and over, adult: Secondary | ICD-10-CM

## 2017-10-10 DIAGNOSIS — E7849 Other hyperlipidemia: Secondary | ICD-10-CM

## 2017-10-10 DIAGNOSIS — R0602 Shortness of breath: Secondary | ICD-10-CM

## 2017-10-10 DIAGNOSIS — R5383 Other fatigue: Secondary | ICD-10-CM | POA: Diagnosis not present

## 2017-10-10 DIAGNOSIS — Z9189 Other specified personal risk factors, not elsewhere classified: Secondary | ICD-10-CM | POA: Diagnosis not present

## 2017-10-11 ENCOUNTER — Encounter: Payer: Self-pay | Admitting: Family Medicine

## 2017-10-11 DIAGNOSIS — R5383 Other fatigue: Secondary | ICD-10-CM | POA: Diagnosis not present

## 2017-10-11 DIAGNOSIS — R0602 Shortness of breath: Secondary | ICD-10-CM | POA: Diagnosis not present

## 2017-10-11 DIAGNOSIS — E7849 Other hyperlipidemia: Secondary | ICD-10-CM | POA: Diagnosis not present

## 2017-10-12 LAB — COMPREHENSIVE METABOLIC PANEL
ALT: 16 IU/L (ref 0–32)
AST: 14 IU/L (ref 0–40)
Albumin/Globulin Ratio: 1.2 (ref 1.2–2.2)
Albumin: 3.9 g/dL (ref 3.5–5.5)
Alkaline Phosphatase: 136 IU/L — ABNORMAL HIGH (ref 39–117)
BUN/Creatinine Ratio: 15 (ref 9–23)
BUN: 12 mg/dL (ref 6–20)
Bilirubin Total: 0.4 mg/dL (ref 0.0–1.2)
CO2: 23 mmol/L (ref 20–29)
Calcium: 9.2 mg/dL (ref 8.7–10.2)
Chloride: 98 mmol/L (ref 96–106)
Creatinine, Ser: 0.79 mg/dL (ref 0.57–1.00)
GFR calc Af Amer: 113 mL/min/{1.73_m2} (ref >59)
GFR calc non Af Amer: 98 mL/min/{1.73_m2} (ref >59)
Globulin, Total: 3.2 g/dL (ref 1.5–4.5)
Glucose: 87 mg/dL (ref 65–99)
Potassium: 5.1 mmol/L (ref 3.5–5.2)
Sodium: 136 mmol/L (ref 134–144)
Total Protein: 7.1 g/dL (ref 6.0–8.5)

## 2017-10-12 LAB — CBC WITH DIFFERENTIAL
Basophils Absolute: 0 10*3/uL (ref 0.0–0.2)
Basos: 1 %
EOS (ABSOLUTE): 0 10*3/uL (ref 0.0–0.4)
Eos: 1 %
Hematocrit: 42.4 % (ref 34.0–46.6)
Hemoglobin: 14.1 g/dL (ref 11.1–15.9)
Immature Grans (Abs): 0 10*3/uL (ref 0.0–0.1)
Immature Granulocytes: 0 %
Lymphocytes Absolute: 1.7 10*3/uL (ref 0.7–3.1)
Lymphs: 26 %
MCH: 29.1 pg (ref 26.6–33.0)
MCHC: 33.3 g/dL (ref 31.5–35.7)
MCV: 87 fL (ref 79–97)
Monocytes Absolute: 0.6 10*3/uL (ref 0.1–0.9)
Monocytes: 9 %
Neutrophils Absolute: 4.2 10*3/uL (ref 1.4–7.0)
Neutrophils: 63 %
RBC: 4.85 x10E6/uL (ref 3.77–5.28)
RDW: 13.1 % (ref 12.3–15.4)
WBC: 6.6 10*3/uL (ref 3.4–10.8)

## 2017-10-12 LAB — LIPID PANEL WITH LDL/HDL RATIO
Cholesterol, Total: 225 mg/dL — ABNORMAL HIGH (ref 100–199)
HDL: 70 mg/dL (ref >39)
LDL Calculated: 133 mg/dL — ABNORMAL HIGH (ref 0–99)
LDl/HDL Ratio: 1.9 ratio (ref 0.0–3.2)
Triglycerides: 109 mg/dL (ref 0–149)
VLDL Cholesterol Cal: 22 mg/dL (ref 5–40)

## 2017-10-12 LAB — TSH: TSH: 2.95 u[IU]/mL (ref 0.450–4.500)

## 2017-10-12 LAB — INSULIN, RANDOM: INSULIN: 10.3 u[IU]/mL (ref 2.6–24.9)

## 2017-10-12 LAB — T4, FREE: Free T4: 1.2 ng/dL (ref 0.82–1.77)

## 2017-10-12 LAB — HEMOGLOBIN A1C
Est. average glucose Bld gHb Est-mCnc: 108 mg/dL
Hgb A1c MFr Bld: 5.4 % (ref 4.8–5.6)

## 2017-10-12 LAB — FOLATE: Folate: 6.2 ng/mL (ref >3.0)

## 2017-10-12 LAB — VITAMIN D 25 HYDROXY (VIT D DEFICIENCY, FRACTURES): Vit D, 25-Hydroxy: 29.2 ng/mL — ABNORMAL LOW (ref 30.0–100.0)

## 2017-10-12 LAB — T3: T3, Total: 166 ng/dL (ref 71–180)

## 2017-10-12 LAB — VITAMIN B12: Vitamin B-12: 299 pg/mL (ref 232–1245)

## 2017-10-13 ENCOUNTER — Encounter (INDEPENDENT_AMBULATORY_CARE_PROVIDER_SITE_OTHER): Payer: Self-pay | Admitting: Family Medicine

## 2017-10-15 ENCOUNTER — Ambulatory Visit (INDEPENDENT_AMBULATORY_CARE_PROVIDER_SITE_OTHER): Payer: BLUE CROSS/BLUE SHIELD | Admitting: Family Medicine

## 2017-10-15 ENCOUNTER — Encounter: Payer: Self-pay | Admitting: Family Medicine

## 2017-10-15 DIAGNOSIS — E785 Hyperlipidemia, unspecified: Secondary | ICD-10-CM | POA: Diagnosis not present

## 2017-10-15 DIAGNOSIS — Z0000 Encounter for general adult medical examination without abnormal findings: Secondary | ICD-10-CM

## 2017-10-15 DIAGNOSIS — F419 Anxiety disorder, unspecified: Secondary | ICD-10-CM | POA: Diagnosis not present

## 2017-10-15 HISTORY — DX: Encounter for general adult medical examination without abnormal findings: Z00.00

## 2017-10-15 NOTE — Progress Notes (Signed)
Subjective:    Patient ID: Selena Flores, female    DOB: [birth year], 37 y.o.   MRN: [id number]  No chief complaint on file.   HPI Patient is in today for annual preventative exam and follow up on anxiety, hyperlipidemia, and obesity. She has established with healthy weight and wellness recently and is focused on loosing weight. She is hoping it will be helpful. Has been withdrawing from Sertraline in an effort to stop and she experienced a high level of irritability and anger management issues and would punch a wall at times. That has gotten better. But some old symptoms have returned especially some OCD type symptoms with washing her water cup obsessively etc. No recent febrile illness or hospitalizations. No difficulties with ADLs. Denies CP/palp/SOB/HA/congestion/fevers/GI or GU c/o. Taking meds as prescribed  Past Medical History:  Diagnosis Date  . Anxiety   . Chronic kidney disease    kidney infection 2008 ish  . Constipation   . Food allergy    onions itching  . GERD (gastroesophageal reflux disease)   . Hyperlipidemia   . Migraines     Past Surgical History:  Procedure Laterality Date  . CERVICAL CONE BIOPSY    . TONSILLECTOMY      Family History  Problem Relation Age of Onset  . Heart disease Mother        chf  . Obesity Mother   . Anxiety disorder Mother   . Skin cancer Father   . Cancer Father        skin cancer    Social History   Socioeconomic History  . Marital status: Significant Other    Spouse name: Alanee Ting  . Number of children: 0  . Years of education: Not on file  . Highest education level: Not on file  Occupational History  . Occupation: Arts development officer    Comment: Customer Service   Social Needs  . Financial resource strain: Not on file  . Food insecurity:    Worry: Not on file    Inability: Not on file  . Transportation needs:    Medical: Not on file    Non-medical: Not on file  Tobacco Use  . Smoking  status: Never Smoker  . Smokeless tobacco: Never Used  . Tobacco comment: smoked for 1 yr socially in college  Substance and Sexual Activity  . Alcohol use: Yes    Comment: social   . Drug use: Yes  . Sexual activity: Yes  Lifestyle  . Physical activity:    Days per week: Not on file    Minutes per session: Not on file  . Stress: Not on file  Relationships  . Social connections:    Talks on phone: Not on file    Gets together: Not on file    Attends religious service: Not on file    Active member of club or organization: Not on file    Attends meetings of clubs or organizations: Not on file    Relationship status: Not on file  . Intimate partner violence:    Fear of current or ex partner: Not on file    Emotionally abused: Not on file    Physically abused: Not on file    Forced sexual activity: Not on file  Other Topics Concern  . 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 aspirin-acetaminophen-caffeine (EXCEDRIN MIGRAINE) 250-250-65 MG tablet Take 1 tablet by mouth every 6 (six) hours as needed for headache.    . calcium carbonate (TUMS - DOSED IN MG ELEMENTAL CALCIUM) 500 MG chewable tablet Chew 1 tablet by mouth daily as needed for indigestion or heartburn.    . Norgestimate-Ethinyl Estradiol Triphasic 0.18/0.215/0.25 MG-35 MCG tablet Take 1 tablet by mouth daily.    Marland Kitchen. omeprazole (PRILOSEC) 20 MG capsule Take 20 mg by mouth daily as needed.     No facility-administered medications prior to visit.     Allergies  Allergen Reactions  . Ciprofloxacin Itching  . Rocephin [Ceftriaxone] Itching  . Penicillins Itching and Rash    Has patient had a PCN reaction causing immediate rash, facial/tongue/throat swelling, SOB or lightheadedness with hypotension: Unknown Has patient had a PCN  reaction causing severe rash involving mucus membranes or skin necrosis: Unknown Has patient had a PCN reaction that required hospitalization Unknown Has patient had a PCN reaction occurring within the last 10 years: Yes If all of the above answers are "NO", then may proceed with Cephalosporin use.     Review of Systems  Constitutional: Negative for fever and malaise/fatigue.  HENT: Negative for congestion and hearing loss.   Eyes: Negative for blurred vision.  Respiratory: Negative for shortness of breath.   Cardiovascular: Positive for palpitations. Negative for chest pain and leg swelling.  Gastrointestinal: Negative for abdominal pain, blood in stool and nausea.  Genitourinary: Negative for dysuria and frequency.  Musculoskeletal: Negative for falls.  Skin: Negative for rash.  Neurological: Negative for dizziness, loss of consciousness and headaches.  Endo/Heme/Allergies: Negative for environmental allergies.  Psychiatric/Behavioral: Negative for depression. The patient is nervous/anxious.        Objective:    Physical Exam  Constitutional: She is oriented to person, place, and time. She appears well-developed and well-nourished. No distress.  HENT:  Head: Normocephalic and atraumatic.  Eyes: Conjunctivae are normal.  Neck: Neck supple. No thyromegaly present.  Cardiovascular: Normal rate, regular rhythm and normal heart sounds.  No murmur heard. Pulmonary/Chest: Effort normal and breath sounds normal. No respiratory distress.  Abdominal: Soft. Bowel sounds are normal. She exhibits no distension and no mass. There is no tenderness.  Musculoskeletal: She exhibits no edema.  Lymphadenopathy:    She has no cervical adenopathy.  Neurological: She is alert and oriented to person, place, and time.  Skin: Skin is warm and dry.  Psychiatric: She has a normal mood and affect. Her behavior is normal.    BP 100/78 (BP Location: Left Arm, Patient Position: Sitting, Cuff Size:  Large)   Pulse 86   Temp 98.2 F (36.8 C) (Oral)   Resp 18   Wt 271 lb 6.4 oz (123.1 kg)   LMP 10/01/2017 (Exact Date)   SpO2 97%   BMI 48.08 kg/m  Wt Readings from Last 3 Encounters:  10/15/17 271 lb 6.4 oz (123.1 kg)  10/10/17 266 lb (120.7 kg)  07/09/17 277 lb 6.4 oz (125.8 kg)     Lab Results  Component Value Date   WBC 6.6 10/11/2017   HGB 14.1 10/11/2017   HCT 42.4 10/11/2017   PLT 285 04/06/2015   GLUCOSE 87 10/11/2017   CHOL 225 (H) 10/11/2017   TRIG 109 10/11/2017   HDL 70 10/11/2017   LDLCALC 133 (H) 10/11/2017   ALT 16 10/11/2017   AST 14 10/11/2017   NA 136 10/11/2017  K 5.1 10/11/2017   CL 98 10/11/2017   CREATININE 0.79 10/11/2017   BUN 12 10/11/2017   CO2 23 10/11/2017   TSH 2.950 10/11/2017   INR 0.9 05/12/2008   HGBA1C 5.4 10/11/2017    Lab Results  Component Value Date   TSH 2.950 10/11/2017   Lab Results  Component Value Date   WBC 6.6 10/11/2017   HGB 14.1 10/11/2017   HCT 42.4 10/11/2017   MCV 87 10/11/2017   PLT 285 04/06/2015   Lab Results  Component Value Date   NA 136 10/11/2017   K 5.1 10/11/2017   CO2 23 10/11/2017   GLUCOSE 87 10/11/2017   BUN 12 10/11/2017   CREATININE 0.79 10/11/2017   BILITOT 0.4 10/11/2017   ALKPHOS 136 (H) 10/11/2017   AST 14 10/11/2017   ALT 16 10/11/2017   PROT 7.1 10/11/2017   ALBUMIN 3.9 10/11/2017   CALCIUM 9.2 10/11/2017   ANIONGAP 10 04/06/2015   Lab Results  Component Value Date   CHOL 225 (H) 10/11/2017   Lab Results  Component Value Date   HDL 70 10/11/2017   Lab Results  Component Value Date   LDLCALC 133 (H) 10/11/2017   Lab Results  Component Value Date   TRIG 109 10/11/2017   No results found for: CHOLHDL Lab Results  Component Value Date   HGBA1C 5.4 10/11/2017       Assessment & Plan:   Problem List Items Addressed This Visit    Hyperlipidemia    Encouraged heart healthy diet, increase exercise, avoid trans fats, consider a krill oil cap daily       Anxiety    Has been withdrawing from Sertraline in an effort to stop and she experienced a high level of irritability and anger management issues and would punch a wall at times. That has gotten better. But some old symptoms have returned especially some OCD type symptoms with washing her water cup obsessively etc. Is considering going back onto meds and will let us know if she wants to restart the medication. declines counseling for now      Preventative health care    Patient encouraged to maintain heart healthy diet, regular exercise, adequate sleep. Consider daily probiotics. Take medications as prescribed. Given and reviewed copy of ACP documents from [employer] and encouraged to complete and return.         I am having Meagan L. Wooten maintain her Norgestimate-Ethinyl Estradiol Triphasic, aspirin-acetaminophen-caffeine, omeprazole, and calcium carbonate.  No orders of the defined types were placed in this encounter.    Danise Edge, MD

## 2017-10-15 NOTE — Assessment & Plan Note (Signed)
Encouraged heart healthy diet, increase exercise, avoid trans fats, consider a krill oil cap daily 

## 2017-10-15 NOTE — Patient Instructions (Addendum)
Vitamin D 2000 IU daily  [employer], environmental working group Preventive Care 18-39 Years, Female Preventive care refers to lifestyle choices and visits with your health care provider that can promote health and wellness. What does preventive care include?  A yearly physical exam. This is also called an annual well check.  Dental exams once or twice a year.  Routine eye exams. Ask your health care provider how often you should have your eyes checked.  Personal lifestyle choices, including: ? Daily care of your teeth and gums. ? Regular physical activity. ? Eating a healthy diet. ? Avoiding tobacco and drug use. ? Limiting alcohol use. ? Practicing safe sex. ? Taking vitamin and mineral supplements as recommended by your health care provider. What happens during an annual well check? The services and screenings done by your health care provider during your annual well check will depend on your age, overall health, lifestyle risk factors, and family history of disease. Counseling Your health care provider may ask you questions about your:  Alcohol use.  Tobacco use.  Drug use.  Emotional well-being.  Home and relationship well-being.  Sexual activity.  Eating habits.  Work and work Statistician.  Method of birth control.  Menstrual cycle.  Pregnancy history.  Screening You may have the following tests or measurements:  Height, weight, and BMI.  Diabetes screening. This is done by checking your blood sugar (glucose) after you have not eaten for a while (fasting).  Blood pressure.  Lipid and cholesterol levels. These may be checked every 5 years starting at age 10.  Skin check.  Hepatitis C blood test.  Hepatitis B blood test.  Sexually transmitted disease (STD) testing.  BRCA-related cancer screening. This may be done if you have a family history of breast, ovarian, tubal, or peritoneal cancers.  Pelvic exam and Pap test. This may be done every 3 years  starting at age 11. Starting at age 43, this may be done every 5 years if you have a Pap test in combination with an HPV test.  Discuss your test results, treatment options, and if necessary, the need for more tests with your health care provider. Vaccines Your health care provider may recommend certain vaccines, such as:  Influenza vaccine. This is recommended every year.  Tetanus, diphtheria, and acellular pertussis (Tdap, Td) vaccine. You may need a Td booster every 10 years.  Varicella vaccine. You may need this if you have not been vaccinated.  HPV vaccine. If you are 30 or younger, you may need three doses over 6 months.  Measles, mumps, and rubella (MMR) vaccine. You may need at least one dose of MMR. You may also need a second dose.  Pneumococcal 13-valent conjugate (PCV13) vaccine. You may need this if you have certain conditions and were not previously vaccinated.  Pneumococcal polysaccharide (PPSV23) vaccine. You may need one or two doses if you smoke cigarettes or if you have certain conditions.  Meningococcal vaccine. One dose is recommended if you are age 35-21 years and a first-year college student living in a residence hall, or if you have one of several medical conditions. You may also need additional booster doses.  Hepatitis A vaccine. You may need this if you have certain conditions or if you travel or work in places where you may be exposed to hepatitis A.  Hepatitis B vaccine. You may need this if you have certain conditions or if you travel or work in places where you may be exposed to hepatitis B.  Haemophilus  influenzae type b (Hib) vaccine. You may need this if you have certain risk factors.  Talk to your health care provider about which screenings and vaccines you need and how often you need them. This information is not intended to replace advice given to you by your health care provider. Make sure you discuss any questions you have with your health care  provider. Document Released: 03/06/2001 Document Revised: 09/28/2015 Document Reviewed: 11/09/2014 Elsevier Interactive Patient Education  [employer].

## 2017-10-15 NOTE — Progress Notes (Signed)
Office: [phone]  /  Fax: [phone]   Dear Dr. Abner Greenspan,   Thank you for referring Selena Flores to our clinic. The following note includes my evaluation and treatment recommendations.  HPI:   Chief Complaint: OBESITY    Selena Flores has been referred by Misty Stanley A. Abner Greenspan, MD for consultation regarding her obesity and obesity related comorbidities.    Selena Flores (MR# [id number]) is a 37 y.o. female who presents on 10/10/2017 for obesity evaluation and treatment. Current BMI is Body mass index is 47.12 kg/m.Selena Flores has been struggling with her weight for many years and has been unsuccessful in either losing weight, maintaining weight loss, or reaching her healthy weight goal.     Selena Flores attended our information session and states she is currently in the action stage of change and ready to dedicate time achieving and maintaining a healthier weight. Selena Flores is interested in becoming our patient and working on intensive lifestyle modifications including (but not limited to) diet, exercise and weight loss.    Selena Flores states her family eats meals together she thinks her family will eat healthier with  her she struggles with family and or coworkers weight loss sabotage her desired weight loss is 86-106 lbs she has been heavy most of  her life she started gaining weight after the age of 17 or so her heaviest weight ever was 285 lbs she is a picky eater and doesn't like to eat healthier foods  she has significant food cravings issues  she is frequently drinking liquids with calories she frequently makes poor food choices she struggles with emotional eating    Fatigue Selena Flores feels her energy is lower than it should be. This has worsened with weight gain and has not worsened recently. Dinorah admits to daytime somnolence and  admits to waking up still tired. Patient is at risk for obstructive sleep apnea. Patent has a history of symptoms of daytime fatigue.  Patient generally gets 7 hours of sleep per night, and states they generally have generally restful sleep. Snoring is present. Apneic episodes are not present. Epworth Sleepiness Score is 5.  Dyspnea on exertion Selena Flores notes increasing shortness of breath with exercising and seems to be worsening over time with weight gain. She notes getting out of breath sooner with activity than she used to. This has not gotten worse recently. Selena Flores denies orthopnea.  Hyperlipidemia Selena Flores has hyperlipidemia and would like to try to improve her cholesterol levels with intensive lifestyle modification including a low saturated fat diet, exercise and weight loss. She is not on statin and denies any chest pain, claudication or myalgias.  At risk for cardiovascular disease Selena Flores is at a higher than average risk for cardiovascular disease due to obesity and hyperlipidemia. She currently denies any chest pain.  Positive Depression Screen Selena Flores had a positive depression screening with significant emotional eating and unhealthy relationship with food. She also shows signs of body dysmorphia.   Depression Screen Selena Flores's Food and Mood (modified PHQ-9) score was  Depression screen PHQ 2/9 10/10/2017  Decreased Interest 2  Down, Depressed, Hopeless 1  PHQ - 2 Score 3  Altered sleeping 0  Tired, decreased energy 2  Change in appetite 1  Feeling bad or failure about yourself  0  Trouble concentrating 0  Moving slowly or fidgety/restless 1  Suicidal thoughts 0  PHQ-9 Score 7  Difficult doing work/chores Not difficult at all    ALLERGIES: Allergies  Allergen Reactions  . Ciprofloxacin Itching  . Rocephin [  Ceftriaxone] Itching  . Penicillins Itching and Rash    Has patient had a PCN reaction causing immediate rash, facial/tongue/throat swelling, SOB or lightheadedness with hypotension: Unknown Has patient had a PCN reaction causing severe rash involving mucus membranes or skin necrosis:  Unknown Has patient had a PCN reaction that required hospitalization Unknown Has patient had a PCN reaction occurring within the last 10 years: Yes If all of the above answers are "NO", then may proceed with Cephalosporin use.     MEDICATIONS: Current Outpatient Medications on File Prior to Visit  Medication Sig Dispense Refill  . aspirin-acetaminophen-caffeine (EXCEDRIN MIGRAINE) 250-250-65 MG tablet Take 1 tablet by mouth every 6 (six) hours as needed for headache.    . calcium carbonate (TUMS - DOSED IN MG ELEMENTAL CALCIUM) 500 MG chewable tablet Chew 1 tablet by mouth daily as needed for indigestion or heartburn.    . Norgestimate-Ethinyl Estradiol Triphasic 0.18/0.215/0.25 MG-35 MCG tablet Take 1 tablet by mouth daily.    Marland Kitchen omeprazole (PRILOSEC) 20 MG capsule Take 20 mg by mouth daily as needed.     No current facility-administered medications on file prior to visit.     PAST MEDICAL HISTORY: Past Medical History:  Diagnosis Date  . Anxiety   . Chronic kidney disease    kidney infection 2008 ish  . Constipation   . Food allergy    onions itching  . GERD (gastroesophageal reflux disease)   . Hyperlipidemia   . Migraines     PAST SURGICAL HISTORY: Past Surgical History:  Procedure Laterality Date  . CERVICAL CONE BIOPSY    . TONSILLECTOMY      SOCIAL HISTORY: Social History   Tobacco Use  . Smoking status: Never Smoker  . Smokeless tobacco: Never Used  . Tobacco comment: smoked for 1 yr socially in college  Substance Use Topics  . Alcohol use: Yes    Comment: social   . Drug use: Yes    FAMILY HISTORY: Family History  Problem Relation Age of Onset  . Heart disease Mother        chf  . Obesity Mother   . Anxiety disorder Mother   . Skin cancer Father   . Cancer Father        skin cancer    ROS: Review of Systems  Constitutional: Positive for malaise/fatigue. Negative for weight loss.  Eyes:       + Wear glasses or contacts (most of the time)   Respiratory: Positive for shortness of breath (with exertion).   Cardiovascular: Negative for chest pain, orthopnea and claudication.       + Calf/leg pain with walking (for long distances) + Leg cramping  Gastrointestinal: Positive for heartburn.  Musculoskeletal: Negative for myalgias.  Skin:       + Dryness  Neurological: Positive for headaches.  Endo/Heme/Allergies: Bruises/bleeds easily.  Psychiatric/Behavioral: Positive for depression. Negative for suicidal ideas.       + Stress + Anxiety    PHYSICAL EXAM: Blood pressure 129/80, pulse 75, temperature 98 F (36.7 C), temperature source Oral, height 5\' 3"  (1.6 m), weight 266 lb (120.7 kg), last menstrual period 10/01/2017, SpO2 99 %. Body mass index is 47.12 kg/m. Physical Exam  Constitutional: She is oriented to person, place, and time. She appears well-developed and well-nourished.  HENT:  Head: Normocephalic and atraumatic.  Nose: Nose normal.  Eyes: EOM are normal. No scleral icterus.  Neck: Normal range of motion. Neck supple. No thyromegaly present.  Cardiovascular: Normal rate and  regular rhythm.  Pulmonary/Chest: Effort normal. No respiratory distress.  Abdominal: Soft. There is no tenderness.  + Obesity  Musculoskeletal:  Range of Motion normal in all 4 extremities Trace edema noted in bilateral lower extremities   Neurological: She is alert and oriented to person, place, and time. Coordination normal.  Skin: Skin is warm and dry.  Psychiatric: She has a normal mood and affect. Her behavior is normal.  Vitals reviewed.   RECENT LABS AND TESTS: BMET    Component Value Date/Time   NA 136 10/11/2017 0849   K 5.1 10/11/2017 0849   CL 98 10/11/2017 0849   CO2 23 10/11/2017 0849   GLUCOSE 87 10/11/2017 0849   GLUCOSE 106 (H) 04/06/2015 1136   BUN 12 10/11/2017 0849   CREATININE 0.79 10/11/2017 0849   CALCIUM 9.2 10/11/2017 0849   GFRNONAA 98 10/11/2017 0849   GFRAA 113 10/11/2017 0849   Lab Results   Component Value Date   HGBA1C 5.4 10/11/2017   Lab Results  Component Value Date   INSULIN 10.3 10/11/2017   CBC    Component Value Date/Time   WBC 6.6 10/11/2017 0849   WBC 10.5 04/06/2015 1136   RBC 4.85 10/11/2017 0849   RBC 5.07 04/06/2015 1136   HGB 14.1 10/11/2017 0849   HCT 42.4 10/11/2017 0849   PLT 285 04/06/2015 1136   MCV 87 10/11/2017 0849   MCH 29.1 10/11/2017 0849   MCH 29.4 04/06/2015 1136   MCHC 33.3 10/11/2017 0849   MCHC 32.4 04/06/2015 1136   RDW 13.1 10/11/2017 0849   LYMPHSABS 1.7 10/11/2017 0849   MONOABS 1.1 (H) 02/28/2007 0247   EOSABS 0.0 10/11/2017 0849   BASOSABS 0.0 10/11/2017 0849   Iron/TIBC/Ferritin/ %Sat No results found for: IRON, TIBC, FERRITIN, IRONPCTSAT Lipid Panel     Component Value Date/Time   CHOL 225 (H) 10/11/2017 0849   TRIG 109 10/11/2017 0849   HDL 70 10/11/2017 0849   LDLCALC 133 (H) 10/11/2017 0849   Hepatic Function Panel     Component Value Date/Time   PROT 7.1 10/11/2017 0849   ALBUMIN 3.9 10/11/2017 0849   AST 14 10/11/2017 0849   ALT 16 10/11/2017 0849   ALKPHOS 136 (H) 10/11/2017 0849   BILITOT 0.4 10/11/2017 0849      Component Value Date/Time   TSH 2.950 10/11/2017 0849    ECG  shows NSR with a rate of 85 BPM INDIRECT CALORIMETER done today shows a VO2 of 298 and a REE of 2076.  Her calculated basal metabolic rate is 1610 thus her basal metabolic rate is better than expected.    ASSESSMENT AND PLAN: Other fatigue - Plan: EKG 12-Lead, Vitamin B12, CBC With Differential, Comprehensive metabolic panel, Folate, Hemoglobin A1c, Insulin, random, T3, T4, free, TSH, VITAMIN D 25 Hydroxy (Vit-D Deficiency, Fractures)  Shortness of breath on exertion - Plan: CBC With Differential  Other hyperlipidemia - Plan: Lipid Panel With LDL/HDL Ratio  Positive depression screening  At risk for heart disease  Class 3 severe obesity with serious comorbidity and body mass index (BMI) of 45.0 to 49.9 in adult,  unspecified obesity type (HCC)  PLAN:  Fatigue Xinyi was informed that her fatigue may be related to obesity, depression or many other causes. Labs will be ordered, and in the meanwhile Jane has agreed to work on diet, exercise and weight loss to help with fatigue. Proper sleep hygiene was discussed including the need for 7-8 hours of quality sleep each night. A sleep  study was not ordered based on symptoms and Epworth score.  Dyspnea on exertion Karrine's shortness of breath appears to be obesity related and exercise induced. She has agreed to work on weight loss and gradually increase exercise to treat her exercise induced shortness of breath. If Anila follows our instructions and loses weight without improvement of her shortness of breath, we will plan to refer to pulmonology. We will monitor this condition regularly. Selena Flores agrees to this plan.  Hyperlipidemia Selena Flores was informed of the American Heart Association Guidelines emphasizing intensive lifestyle modifications as the first line treatment for hyperlipidemia. We discussed many lifestyle modifications today in depth, and Kala will continue to work on decreasing saturated fats such as fatty red meat, butter and many fried foods. She will start diet and will also increase vegetables and lean protein in her diet and continue to work on exercise and weight loss efforts. We will check labs and Selena Flores agrees to follow up with our clinic in 2 weeks with myself and Dr. Dewaine Conger, our bariatric psychologist.  Cardiovascular risk counselling Selena Flores was given extended (15 minutes) coronary artery disease prevention counseling today. She is 37 y.o. female and has risk factors for heart disease including obesity and hyperlipidemia. We discussed intensive lifestyle modifications today with an emphasis on specific weight loss instructions and strategies. Pt was also informed of the importance of increasing exercise and decreasing  saturated fats to help prevent heart disease.  Positive Depression Screen Selena Flores had a positive depression screening. Depression is commonly associated with obesity and often results in emotional eating behaviors. We will monitor this closely and work on CBT to help improve the non-hunger eating patterns. We will refer to Dr. Dewaine Conger, our bariatric psychologist for evaluation and therapy.  Depression Screen Selena Flores had a mildly positive depression screening. Depression is commonly associated with obesity and often results in emotional eating behaviors. We will monitor this closely and work on CBT to help improve the non-hunger eating patterns. Referral to Psychology may be required if no improvement is seen as she continues in our clinic.  Obesity Selena Flores is currently in the action stage of change and her goal is to continue with weight loss efforts. I recommend Selena Flores begin the structured treatment plan as follows:  She has agreed to follow the Category 3 plan Selena Flores has been instructed to eventually work up to a goal of 150 minutes of combined cardio and strengthening exercise per week for weight loss and overall health benefits. We discussed the following Behavioral Modification Strategies today: increasing lean protein intake, decreasing simple carbohydrates  and work on meal planning and easy cooking plans   She was informed of the importance of frequent follow up visits to maximize her success with intensive lifestyle modifications for her multiple health conditions. She was informed we would discuss her lab results at her next visit unless there is a critical issue that needs to be addressed sooner. Kendrea agreed to keep her next visit at the agreed upon time to discuss these results.    OBESITY BEHAVIORAL INTERVENTION VISIT  Today's visit was # 1   Starting weight: 266 lbs Starting date: 10/10/17 Today's weight : 266 lbs  Today's date: 10/10/2017 Total lbs lost to date:  0    ASK: We discussed the diagnosis of obesity with Elisha Ponder today and Reeva agreed to give [place] permission to discuss obesity behavioral modification therapy today.  ASSESS: Joycie has the diagnosis of obesity and her BMI today is 47.13 Kaleeya is in  the action stage of change   ADVISE: Selena DusterMichelle was educated on the multiple health risks of obesity as well as the benefit of weight loss to improve her health. She was advised of the need for long term treatment and the importance of lifestyle modifications to improve her current health and to decrease her risk of future health problems.  AGREE: Multiple dietary modification options and treatment options were discussed and  Selena DusterMichelle agreed to follow the recommendations documented in the above note.  ARRANGE: Selena DusterMichelle was educated on the importance of frequent visits to treat obesity as outlined per CMS and USPSTF guidelines and agreed to schedule her next follow up appointment today.  I, Burt KnackSharon Martin, am acting as transcriptionist for Quillian Quincearen Jemari Hallum, MD  I have reviewed the above documentation for accuracy and completeness, and I agree with the above. -Quillian Quincearen Laquiesha Piacente, MD

## 2017-10-15 NOTE — Assessment & Plan Note (Signed)
Has been withdrawing from Sertraline in an effort to stop and she experienced a high level of irritability and anger management issues and would punch a wall at times. That has gotten better. But some old symptoms have returned especially some OCD type symptoms with washing her water cup obsessively etc. Is considering going back onto meds and will let us know if she wants to restart the medication. declines counseling for now

## 2017-10-15 NOTE — Assessment & Plan Note (Signed)
Patient encouraged to maintain heart healthy diet, regular exercise, adequate sleep. Consider daily probiotics. Take medications as prescribed. Given and reviewed copy of ACP documents from [place] Secretary of State and encouraged to complete and return 

## 2017-10-17 NOTE — Progress Notes (Signed)
Office: [phone]  /  Fax: [phone] Date: October 24, 2017 Time Seen: 10:02am Duration: 53 minutes Provider: Glennie Isle, PsyD Type of Session: Intake for Individual Therapy   Informed Consent:The provider's role was explained to Selena Flores. The provider reviewed and discussed issues of confidentiality, privacy, and limits therein. Since the clinic is not a 24/7 crisis center, mental health emergency resources were shared and a handout was provided. Selena Flores verbally acknowledged understanding, and agreed to use mental health emergency resources discussed if needed. In addition to written consent, verbal informed consent for psychological services was obtained from Selena Flores prior to the initial intake interview. Moreover, Selena Flores agreed information may be shared with other [facility] providers as needed for coordination of care. Written consent was also provided for this provider to coordinate care with other providers at [facility].   Chief Complaint: Selena Flores was referred by Selena Flores. Per the note for the initial visit with Selena Flores on October 10, 2017, "Selena Flores had a positive depression screening with significant emotional eating and unhealthy relationship with food. She also shows signs of body dysmorphia."  During today's appointment, Selena Flores stated, "My regular doctor referred to me to Selena Flores in April." She explained she previously lost weight, but could not keep it off. Regarding the appointment with this provider, Selena Flores reported she hopes to talk about her eating habits related to her mood. As it relates to emotional eating, Annice explained she overeats when celebrating, and noted, "I'm more likely to over drink when celebrating." She also described eating when experiencing unpleasant emotions for comfort. Selena Flores explained she does not often over eat, rather makes choices that are not healthy. She  identified work stress as a Animal nutritionist for emotional eating. Moreover, Selena Flores explained a history of anxiety and she recently discontinued Zoloft. Currnetly, she and her primary care physician are in a "debate" to determine if she should be prescribed a psychotropic medication again. Since starting with the clinic, Selena Flores noted if she experiences emotional hunger, she will eat foods on her meal plan.   Selena Flores was asked to complete a questionnaire assessing various behaviors related to emotional eating. Selena Flores endorsed the following: overeat when you are celebrating, experience food cravings on a regular basis, eat certain foods when you are anxious, stressed, depressed, or your feelings are hurt, find food is comforting to you, overeat when you are angry or upset, overeat frequently when you are bored or lonely, not worry about what you eat when you are in a good mood, eat to help you stay awake and eat as a reward.  HPI: Per the note for the initial visit with Selena Flores on October 10, 2017, she has been heavy most of her life and she started gaining weight after the age of 54 or so. Her heaviest weight ever was 285 pounds. During the initial appointment with Selena Flores, Selena Flores reported experiencing the following: significant food craving issues; frequently drinking liquids with calories; frequently making poor food choices; and struggling with emotional eating. She also described herself as a picky eater and does not like to eat healthier foods. During today's appointment, Selena Flores denied a history of binge eating. She denied a history of purging and engagement in other compensatory strategies. Selena Flores indicated she has never been diagnosed with an eating disorder. Moreover, Selena Flores discussed a history of eating fast food starting as a child; therefore, she has gotten used to "eating out all the time."   Mental  Status Examination: Selena Flores arrived on time for the appointment;  however, the appointment was initiated late due to this provider. She presented as appropriately dressed and groomed. Selena Flores appeared her stated age and demonstrated adequate orientation to time, place, person, and purpose of the appointment. She also demonstrated appropriate eye contact. No psychomotor abnormalities or behavioral peculiarities noted. Her mood was euthymic with congruent affect. Her thought processes were logical, linear, and goal-directed. No hallucinations, delusions, bizarre thinking or behavior reported or observed. Judgment, insight, and impulse control appeared to be grossly intact. There was no evidence of paraphasias (i.e., errors in speech, gross mispronunciations, and word substitutions), repetition deficits, or disturbances in volume or prosody (i.e., rhythm and intonation). There was no evidence of attention or memory impairments. Selena Flores denied current suicidal and homicidal ideation, plan, and intent.   The Mini-Mental State Examination, Second Edition (MMSE-2) was administered. The MMSE-2 briefly screens for cognitive dysfunction and overall mental status and assesses different cognitive domains: orientation, registration, attention and calculation, recall, and language and praxis. Kayliana received 29 out of 30 points possible on the MMSE-2, which is noted in the normal range.  A point was lost on the attention and calculation task due to acalculating error.  Family & Psychosocial History: Selena Flores shared she has been with her boyfriend for approximately two years. She noted she does not have any children. 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 She indicated her highest level of education is two years of college, but did not obtain a degree. Selena Flores noted her social support system consists of her boyfriend, boyfriend's sister, co-workers, and three or four close friends. She does not identify with any religion.   Medical History:  Past Medical  History:  Diagnosis Date  . Anxiety   . Chronic kidney disease    kidney infection 2008 ish  . Constipation   . Food allergy    onions itching  . GERD (gastroesophageal reflux disease)   . Hyperlipidemia   . Migraines    Past Surgical History:  Procedure Laterality Date  . CERVICAL CONE BIOPSY    . TONSILLECTOMY     Current Outpatient Medications on File Prior to Visit  Medication Sig Dispense Refill  . aspirin-acetaminophen-caffeine (EXCEDRIN MIGRAINE) 250-250-65 MG tablet Take 1 tablet by mouth every 6 (six) hours as needed for headache.    . calcium carbonate (TUMS - DOSED IN MG ELEMENTAL CALCIUM) 500 MG chewable tablet Chew 1 tablet by mouth daily as needed for indigestion or heartburn.    . Norgestimate-Ethinyl Estradiol Triphasic 0.18/0.215/0.25 MG-35 MCG tablet Take 1 tablet by mouth daily.    Marland Kitchen omeprazole (PRILOSEC) 20 MG capsule Take 20 mg by mouth daily as needed.     No current facility-administered medications on file prior to visit.   Jinna denied a history of head injuries or loss of consciousness.   Mental Health History: Tiffaney denied a history of therapeutic services. She denied history of hospitalization for psychiatric concerns and has never met with a psychiatrist. Quetzali noted her previous primary care physician previously prescribed Zoloft following separation from her then husband. She denied knowledge of family history of mental health concerns. She denied a trauma history, including sexual, physical, and psychological abuse, as well as neglect.  Mekiyah reported experiencing the following: feeling down; difficulty staying asleep; worry thoughts related to finances and losing her job; fatigue; decreased self-esteem; ruminating thoughts related to previous statements and behaviors; and irritability. She discussed that throughout childhood and young adulthood, she  experienced difficulty seeing the future. For example, "I'm never going to live to drive."  Regarding obsessions and compulsions, Sadonna indicated that if her water bottle was out of her sight, she would have to go wash it. She explained it is more associated with germs. For example, she would not use public restrooms. Elene indicated, "It was never to the point where things disrupted me." She denied experiencing the following: social isolation; hallucinations and delusions; mania; attention and concentration issues; memory concerns; history and current engagement in self-harm; history of and current suicidal and homicidal ideation, plan, and intent. She denied substance use, but noted she consumes alcohol. Prior to starting with this clinic, Lashena indicated consuming a standard glass of wine or a seltzer alcoholic beverage approximately two to three times a week. When celebrating, Baley indicated she may consume four to five drinks over the course of six hours. She denied ever blacking out or having legal repercussions secondary to alcohol use.   Structured Assessment Results: The Patient Health Questionnaire-9 (PHQ-9) is a self-report measure that assesses symptoms and severity of depression over the course of the last two weeks. Derita obtained a score of four suggesting minimal depression. Danell finds the endorsed symptoms to be somewhat difficult.  Depression screen PHQ 2/9 10/24/2017  Decreased Interest 0  Down, Depressed, Hopeless 1  PHQ - 2 Score 1  Altered sleeping 1  Tired, decreased energy 1  Change in appetite 0  Feeling bad or failure about yourself  1  Trouble concentrating 0  Moving slowly or fidgety/restless 0  Suicidal thoughts 0  PHQ-9 Score 4  Difficult doing work/chores -   The Generalized Anxiety Disorder-7 (GAD-7) is a brief self-report measure that assesses symptoms of anxiety over the course of the last two weeks. Areya obtained a score of six suggesting mild anxiety. GAD 7 : Generalized Anxiety Score 10/24/2017  Nervous, Anxious, on Edge 1    Control/stop worrying 1  Worry too much - different things 1  Trouble relaxing 1  Restless 0  Easily annoyed or irritable 1  Afraid - awful might happen 1  Total GAD 7 Score 6  Anxiety Difficulty Somewhat difficult   Interventions: A chart review was conducted prior to the clinical intake interview. The MMSE-2, PHQ-9, and GAD-7 were administered and a clinical intake interview was completed. In addition, Darina was asked to complete a Mood and Food questionnaire to assess various behaviors related to emotional eating. Throughout session, empathic reflections and validation was provided. Continuing treatment with this provider was discussed and a treatment goal was established. Psychoeducation regarding emotional versus physical hunger was provided. Aslee was given a handout to utilize between now and the next appointment to increase awareness of hunger patterns and subsequent eating.   Provisional DSM-5 Diagnosis: 296.31 (F33.0) Major Depressive Disorder, Recurrent Episode, Mild, With Anxious Distress   Plan: Eilee expressed understanding and agreement with the initial treatment plan of care. She appears able and willing to participate as evidenced by collaboration on a treatment goal, engagement in reciprocal conversation, and asking questions as needed for clarification. The next appointment will be scheduled in two weeks. The following treatment goal was established: decrease emotional eating. For the aforementioned goal, Lindaann can benefit from biweekly sessions that are brief in duration for approximately four to six sessions.

## 2017-10-24 ENCOUNTER — Ambulatory Visit (INDEPENDENT_AMBULATORY_CARE_PROVIDER_SITE_OTHER): Payer: BLUE CROSS/BLUE SHIELD | Admitting: Family Medicine

## 2017-10-24 ENCOUNTER — Ambulatory Visit (INDEPENDENT_AMBULATORY_CARE_PROVIDER_SITE_OTHER): Payer: BLUE CROSS/BLUE SHIELD | Admitting: Psychology

## 2017-10-24 VITALS — BP 106/71 | HR 82 | Temp 98.1°F | Ht 63.0 in | Wt 263.0 lb

## 2017-10-24 DIAGNOSIS — E559 Vitamin D deficiency, unspecified: Secondary | ICD-10-CM | POA: Diagnosis not present

## 2017-10-24 DIAGNOSIS — Z9189 Other specified personal risk factors, not elsewhere classified: Secondary | ICD-10-CM | POA: Diagnosis not present

## 2017-10-24 DIAGNOSIS — E8881 Metabolic syndrome: Secondary | ICD-10-CM | POA: Diagnosis not present

## 2017-10-24 DIAGNOSIS — Z6841 Body Mass Index (BMI) 40.0 and over, adult: Secondary | ICD-10-CM | POA: Diagnosis not present

## 2017-10-24 DIAGNOSIS — F330 Major depressive disorder, recurrent, mild: Secondary | ICD-10-CM | POA: Diagnosis not present

## 2017-10-24 MED ORDER — METFORMIN HCL 500 MG PO TABS: 500.0000 mg | ORAL_TABLET | Freq: Every day | ORAL | 0 refills | Status: DC

## 2017-10-24 MED ORDER — VITAMIN D (ERGOCALCIFEROL) 1.25 MG (50000 UNIT) PO CAPS: 50000.0000 [IU] | ORAL_CAPSULE | ORAL | 0 refills | Status: DC

## 2017-10-28 NOTE — Progress Notes (Signed)
Office: [phone]  /  Fax: [phone]   HPI:   Chief Complaint: OBESITY Selena Flores is here to discuss her progress with her obesity treatment plan. She is on the Category 3 plan and is following her eating plan approximately 95 % of the time. She states she is exercising 0 minutes 0 times per week. Selena Flores did very well with weight loss on her Category 3 plan. She states her hunger is controlled, but she would like to look at more recipe options.  Her weight is 263 lb (119.3 kg) today and has had a weight loss of 3 pounds over a period of 2 weeks since her last visit. She has lost 3 lbs since starting treatment with [place].  Vitamin D Deficiency Selena Flores has a new diagnosis of vitamin D deficiency. She is not on Vit D, she notes fatigue and denies nausea, vomiting or muscle weakness.  Insulin Resistance Selena Flores has a new diagnosis of insulin resistance based on her elevated fasting insulin level >5. Although Selena Flores's blood glucose readings are still under good control, insulin resistance puts her at greater risk of metabolic syndrome and diabetes. She is not taking metformin currently, she notes polyphagia and continues to work on diet and exercise to decrease risk of diabetes.  At risk for diabetes Selena Flores is at higher than average risk for developing diabetes due to her obesity and insulin resistance. She currently denies polyuria or polydipsia.  ALLERGIES: Allergies  Allergen Reactions  . Ciprofloxacin Itching  . Rocephin [Ceftriaxone] Itching  . Penicillins Itching and Rash    Has patient had a PCN reaction causing immediate rash, facial/tongue/throat swelling, SOB or lightheadedness with hypotension: Unknown Has patient had a PCN reaction causing severe rash involving mucus membranes or skin necrosis: Unknown Has patient had a PCN reaction that required hospitalization Unknown Has patient had a PCN reaction occurring within the last 10 years: Yes If all of the above answers  are "NO", then may proceed with Cephalosporin use.     MEDICATIONS: Current Outpatient Medications on File Prior to Visit  Medication Sig Dispense Refill  . aspirin-acetaminophen-caffeine (EXCEDRIN MIGRAINE) 250-250-65 MG tablet Take 1 tablet by mouth every 6 (six) hours as needed for headache.    . calcium carbonate (TUMS - DOSED IN MG ELEMENTAL CALCIUM) 500 MG chewable tablet Chew 1 tablet by mouth daily as needed for indigestion or heartburn.    . Norgestimate-Ethinyl Estradiol Triphasic 0.18/0.215/0.25 MG-35 MCG tablet Take 1 tablet by mouth daily.    Marland Kitchen omeprazole (PRILOSEC) 20 MG capsule Take 20 mg by mouth daily as needed.     No current facility-administered medications on file prior to visit.     PAST MEDICAL HISTORY: Past Medical History:  Diagnosis Date  . Anxiety   . Chronic kidney disease    kidney infection 2008 ish  . Constipation   . Food allergy    onions itching  . GERD (gastroesophageal reflux disease)   . Hyperlipidemia   . Migraines     PAST SURGICAL HISTORY: Past Surgical History:  Procedure Laterality Date  . CERVICAL CONE BIOPSY    . TONSILLECTOMY      SOCIAL HISTORY: Social History   Tobacco Use  . Smoking status: Never Smoker  . Smokeless tobacco: Never Used  . Tobacco comment: smoked for 1 yr socially in college  Substance Use Topics  . Alcohol use: Yes    Comment: social   . Drug use: Yes    FAMILY HISTORY: Family History  Problem Relation  Age of Onset  . Heart disease Mother        chf  . Obesity Mother   . Anxiety disorder Mother   . Skin cancer Father   . Cancer Father        skin cancer    ROS: Review of Systems  Constitutional: Positive for malaise/fatigue and weight loss.  Gastrointestinal: Negative for nausea and vomiting.  Genitourinary: Negative for frequency.  Musculoskeletal:       Negative muscle weakness  Endo/Heme/Allergies: Negative for polydipsia.       Positive polyphagia    PHYSICAL EXAM: Blood  pressure 106/71, pulse 82, temperature 98.1 F (36.7 C), temperature source Oral, height 5\' 3"  (1.6 m), weight 263 lb (119.3 kg), last menstrual period 10/01/2017, SpO2 97 %. Body mass index is 46.59 kg/m. Physical Exam  Constitutional: She is oriented to person, place, and time. She appears well-developed and well-nourished.  Cardiovascular: Normal rate.  Pulmonary/Chest: Effort normal.  Musculoskeletal: Normal range of motion.  Neurological: She is oriented to person, place, and time.  Skin: Skin is warm and dry.  Psychiatric: She has a normal mood and affect. Her behavior is normal.  Vitals reviewed.   RECENT LABS AND TESTS: BMET    Component Value Date/Time   NA 136 10/11/2017 0849   K 5.1 10/11/2017 0849   CL 98 10/11/2017 0849   CO2 23 10/11/2017 0849   GLUCOSE 87 10/11/2017 0849   GLUCOSE 106 (H) 04/06/2015 1136   BUN 12 10/11/2017 0849   CREATININE 0.79 10/11/2017 0849   CALCIUM 9.2 10/11/2017 0849   GFRNONAA 98 10/11/2017 0849   GFRAA 113 10/11/2017 0849   Lab Results  Component Value Date   HGBA1C 5.4 10/11/2017   Lab Results  Component Value Date   INSULIN 10.3 10/11/2017   CBC    Component Value Date/Time   WBC 6.6 10/11/2017 0849   WBC 10.5 04/06/2015 1136   RBC 4.85 10/11/2017 0849   RBC 5.07 04/06/2015 1136   HGB 14.1 10/11/2017 0849   HCT 42.4 10/11/2017 0849   PLT 285 04/06/2015 1136   MCV 87 10/11/2017 0849   MCH 29.1 10/11/2017 0849   MCH 29.4 04/06/2015 1136   MCHC 33.3 10/11/2017 0849   MCHC 32.4 04/06/2015 1136   RDW 13.1 10/11/2017 0849   LYMPHSABS 1.7 10/11/2017 0849   MONOABS 1.1 (H) 02/28/2007 0247   EOSABS 0.0 10/11/2017 0849   BASOSABS 0.0 10/11/2017 0849   Iron/TIBC/Ferritin/ %Sat No results found for: IRON, TIBC, FERRITIN, IRONPCTSAT Lipid Panel     Component Value Date/Time   CHOL 225 (H) 10/11/2017 0849   TRIG 109 10/11/2017 0849   HDL 70 10/11/2017 0849   LDLCALC 133 (H) 10/11/2017 0849   Hepatic Function Panel      Component Value Date/Time   PROT 7.1 10/11/2017 0849   ALBUMIN 3.9 10/11/2017 0849   AST 14 10/11/2017 0849   ALT 16 10/11/2017 0849   ALKPHOS 136 (H) 10/11/2017 0849   BILITOT 0.4 10/11/2017 0849      Component Value Date/Time   TSH 2.950 10/11/2017 0849  Results for Selena, Flores (MRN [id number]) as of 10/28/2017 11:47  Ref. Range 10/11/2017 08:49  Vitamin D, 25-Hydroxy Latest Ref Range: 30.0 - 100.0 ng/mL 29.2 (L)    ASSESSMENT AND PLAN: Vitamin D deficiency - Plan: Vitamin D, Ergocalciferol, (DRISDOL) 50000 units CAPS capsule  Insulin resistance - Plan: metFORMIN (GLUCOPHAGE) 500 MG tablet  At risk for diabetes mellitus  Class 3  severe obesity with serious comorbidity and body mass index (BMI) of 45.0 to 49.9 in adult, unspecified obesity type (HCC)  PLAN:  Vitamin D Deficiency Selena Flores was informed that low vitamin D levels contributes to fatigue and are associated with obesity, breast, and colon cancer. Selena Flores agrees to start prescription Vit D @50 ,000 IU every week #4 with no refills. She will follow up for routine testing of vitamin D, at least 2-3 times per year. She was informed of the risk of over-replacement of vitamin D and agrees to not increase her dose unless she discusses this with [place] first. Averill agrees to follow up with our clinic in 2 weeks.  Insulin Resistance Selena Flores will continue to work on weight loss, exercise, and decreasing simple carbohydrates in her diet to help decrease the risk of diabetes. We dicussed metformin including benefits and risks. She was informed that eating too many simple carbohydrates or too many calories at one sitting increases the likelihood of GI side effects. Selena Flores agrees to start metformin 500 mg q AM #30 with no refills. Selena Flores agrees to follow up with our clinic in 2 weeks as directed to monitor her progress.  Diabetes risk counselling Selena Flores was given extended (30 minutes) diabetes prevention counseling  today. She is 37 y.o. female and has risk factors for diabetes including obesity and insulin resistance. We discussed intensive lifestyle modifications today with an emphasis on weight loss as well as increasing exercise and decreasing simple carbohydrates in her diet.  Obesity Selena Flores is currently in the action stage of change. As such, her goal is to continue with weight loss efforts She has agreed to follow the Category 3 plan Selena Flores has been instructed to work up to a goal of 150 minutes of combined cardio and strengthening exercise per week for weight loss and overall health benefits. Recipes given. We discussed the following Behavioral Modification Strategies today: increasing lean protein intake, decreasing simple carbohydrates  and work on meal planning and easy cooking plans   Selena Flores has agreed to follow up with our clinic in 2 weeks. She was informed of the importance of frequent follow up visits to maximize her success with intensive lifestyle modifications for her multiple health conditions.   OBESITY BEHAVIORAL INTERVENTION VISIT  Today's visit was # 2   Starting weight: 266 lbs Starting date: 10/10/17 Today's weight : 263 lbs  Today's date: 10/24/2017 Total lbs lost to date: 3    ASK: We discussed the diagnosis of obesity with Selena Flores today and Selena Flores agreed to give [place] permission to discuss obesity behavioral modification therapy today.  ASSESS: Selena Flores has the diagnosis of obesity and her BMI today is 46.6 Selena Flores is in the action stage of change   ADVISE: Selena Flores was educated on the multiple health risks of obesity as well as the benefit of weight loss to improve her health. She was advised of the need for long term treatment and the importance of lifestyle modifications to improve her current health and to decrease her risk of future health problems.  AGREE: Multiple dietary modification options and treatment options were discussed and   Selena Flores agreed to follow the recommendations documented in the above note.  ARRANGE: Selena Flores was educated on the importance of frequent visits to treat obesity as outlined per CMS and USPSTF guidelines and agreed to schedule her next follow up appointment today.  I, Burt Knack, am acting as transcriptionist for Quillian Quince, MD  I have reviewed the above documentation for accuracy and completeness, and  I agree with the above. -Dennard Nip, MD

## 2017-10-29 ENCOUNTER — Encounter: Payer: Self-pay | Admitting: Family Medicine

## 2017-10-30 ENCOUNTER — Encounter: Payer: Self-pay | Admitting: Family Medicine

## 2017-10-30 NOTE — Telephone Encounter (Signed)
LVM for pt to call & schedule [place] appt for tdap.

## 2017-11-11 ENCOUNTER — Ambulatory Visit (INDEPENDENT_AMBULATORY_CARE_PROVIDER_SITE_OTHER): Payer: BLUE CROSS/BLUE SHIELD | Admitting: Family Medicine

## 2017-11-11 ENCOUNTER — Ambulatory Visit (INDEPENDENT_AMBULATORY_CARE_PROVIDER_SITE_OTHER): Payer: BLUE CROSS/BLUE SHIELD | Admitting: Psychology

## 2017-11-11 VITALS — BP 104/71 | HR 82 | Temp 97.7°F | Ht 63.0 in | Wt 260.0 lb

## 2017-11-11 DIAGNOSIS — Z6841 Body Mass Index (BMI) 40.0 and over, adult: Secondary | ICD-10-CM

## 2017-11-11 DIAGNOSIS — Z9189 Other specified personal risk factors, not elsewhere classified: Secondary | ICD-10-CM

## 2017-11-11 DIAGNOSIS — R7303 Prediabetes: Secondary | ICD-10-CM | POA: Diagnosis not present

## 2017-11-11 DIAGNOSIS — E559 Vitamin D deficiency, unspecified: Secondary | ICD-10-CM

## 2017-11-11 DIAGNOSIS — F330 Major depressive disorder, recurrent, mild: Secondary | ICD-10-CM

## 2017-11-11 MED ORDER — METFORMIN HCL 500 MG PO TABS: 500.0000 mg | ORAL_TABLET | Freq: Every day | ORAL | 0 refills | Status: DC

## 2017-11-11 MED ORDER — VITAMIN D (ERGOCALCIFEROL) 1.25 MG (50000 UNIT) PO CAPS: 50000.0000 [IU] | ORAL_CAPSULE | ORAL | 0 refills | Status: DC

## 2017-11-11 NOTE — Progress Notes (Signed)
Office: [phone]  /  Fax: [phone]   Date: November 11, 2017 Time Seen: 4:15pm Duration: 30 minutes Provider: Lawerance Cruel, Psy.D. Type of Session: Individual Therapy   HPI: Selena Flores was referred by Dr. Quillian Quince and seen for an initial appointment by this provider on October 24, 2017. Per the note for the initial visit with Dr. Quillian Quince on October 10, 2017, "Michellehad a positive depression screening with significant emotional eating and unhealthy relationship with food. She also shows signs of body dysmorphia." In addition, per the note for the initial visit with Dr. Quillian Quince on October 10, 2017, Aemilia has been heavy most of her life and she started gaining weight after the age of [age] or so. Her heaviest weight ever was 285 pounds. During the initial appointment with Dr. Dalbert Garnet, Marcelino Duster reported experiencing the following: significant food craving issues; frequently drinking liquids with calories; frequently making poor food choices; and struggling with emotional eating. She also described herself as a picky eater and does not like to eat healthier foods.  She during the initial appointment with this provider, Marcelino Duster stated, "My regular doctor referred to me to Dr. Renae Gloss in April." She explained she previously lost weight, but could not keep it off. Regarding the appointment with this provider, Jendaya reported she hopes to talk about her eating habits related to her mood. As it relates to emotional eating, Jayda explained she overeats when celebrating, and noted, "I'm more likely to over drink when celebrating." She also described eating when experiencing unpleasant emotions for comfort. Marci explained she does not often over eat, rather makes choices that are not healthy. 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 Moreover, Karoline explained a history of anxiety and she recently discontinued Zoloft. Currently, she and her primary  care physician are in a "debate" to determine if she should be prescribed a psychotropic medication again. Since starting with the clinic, Brendaliz noted if she experiences emotional hunger, she will eat foods on her meal plan. Felicity denied a history of binge eating. She denied a history of purging and engagement in other compensatory strategies. Shirleymae indicated she has never been diagnosed with an eating disorder. Additionally, Denell discussed a history of eating fast food starting as a child; therefore, she has gotten used to "eating out all the time." Furthermore, Dru was asked to complete a questionnaire assessing various behaviors related to emotional eating. Marylan endorsed the following: overeat when you are celebrating, experience food cravings on a regular basis, eat certain foods when you are anxious, stressed, depressed, or your feelings are hurt, find food is comforting to you, overeat when you are angry or upset, overeat frequently when you are bored or lonely, not worry about what you eat when you are in a good mood, eat to help you stay awake and eat as a reward.  Session Content: Session focused on the following treatment goal: decrease emotional eating. The session was initiated with the administration of the PHQ-9 and GAD-7, as well as a brief check-in. Stevey reported she lost three pounds and indicated that she feels more in control. However, she discussed experiencing two episodes of anxiety since the last appointment with this provider and noted the last episode was yesterday. Thus, she indicated she is still unsure whether or not she should resume taking Zoloft. Karlea further explained she experienced one episode of emotional eating since the last appointment, which coincided with the anxiety she experienced yesterday. Nevertheless, Mariellen reported she was able to  resume her meal plan this morning. Regarding the physical versus emotional hunger handout, Mandolin  indicated she has "kept it in mind." Psychoeducation regarding triggers for emotional eating was provided. Octavia was provided a handout, and encouraged to utilize the handout between now and the next appointment to increase awareness of triggers and frequency. Marcelino Duster agreed. In addition, this provider focused specifically on the trigger related to socializing and eating as Chala's birthday is next week. She was provided with skills to utilize during her birthday dinner (e.g., requesting a to-go box when her Eusebio Me arrives so that she can pack a portion of her food before she even starts eating). Overall, Kayle was receptive to today's appointment as evidenced by her openness to sharing, responsiveness to feedback, and willingness to identify and discuss her triggers for emotional eating.  Mental Status Examination: Ryana arrived early for the appointment; therefore, the appointment was initiated early. She presented as appropriately dressed and groomed. Heavyn appeared her stated age and demonstrated adequate orientation to time, place, person, and purpose of the appointment. She also demonstrated appropriate eye contact. No psychomotor abnormalities or behavioral peculiarities noted. Her mood was euthymic with congruent affect. Her thought processes were logical, linear, and goal-directed. No hallucinations, delusions, bizarre thinking or behavior reported or observed. Judgment, insight, and impulse control appeared to be grossly intact. There was no evidence of paraphasias (i.e., errors in speech, gross mispronunciations, and word substitutions), repetition deficits, or disturbances in volume or prosody (i.e., rhythm and intonation). There was no evidence of attention or memory impairments. Mishaal denied current suicidal and homicidal ideation, intent or plan.  Structured Assessment Results: The Patient Health Questionnaire-9 (PHQ-9) is a self-report measure that assesses symptoms and  severity of depression over the course of the last two weeks. Maribelle obtained a score of two suggesting minimal depression. Smera finds the endorsed symptoms to be not difficult at all. Depression screen PHQ 2/9 11/11/2017  Decreased Interest 0  Down, Depressed, Hopeless 1  PHQ - 2 Score 1  Altered sleeping 0  Tired, decreased energy 1  Change in appetite 0  Feeling bad or failure about yourself  0  Trouble concentrating 0  Moving slowly or fidgety/restless 0  Suicidal thoughts 0  PHQ-9 Score 2  Difficult doing work/chores -   The Generalized Anxiety Disorder-7 (GAD-7) is a brief self-report measure that assesses symptoms of anxiety over the course of the last two weeks. Lisvet obtained a score of three suggesting minimal anxiety. GAD 7 : Generalized Anxiety Score 11/11/2017  Nervous, Anxious, on Edge 1  Control/stop worrying 1  Worry too much - different things 1  Trouble relaxing 0  Restless 0  Easily annoyed or irritable 0  Afraid - awful might happen 0  Total GAD 7 Score 3  Anxiety Difficulty Somewhat difficult   Interventions: Tanajah was administered the PHQ-9 and GAD-7 for symptom monitoring. Content from the last session was reviewed. Throughout today's session, empathic reflections and validation were provided. Psychoeducation regarding triggers for emotional eating was provided and Merve was given a handout.  DSM-5 Diagnosis: 296.31 (F33.0) Major Depressive Disorder, Recurrent Episode, Mild, With Anxious Distress   Treatment Goal & Progress: Lannie was seen for an initial appointment with this provider on October 24, 2017 during which the following treatment goal was established: decrease emotional eating. Simmie has demonstrated progress in her goal of decreasing emotional eating as evidenced by the decrease in emotional eating episodes as well as her willingness to explore her triggers for emotional eating. During  today's appointment, Marcelino Duster also discussed  increased awareness as it relates to her hunger patterns.  Plan: Kelsi continues to appear able and willing to participate as evidenced by engagement in reciprocal conversation, and asking questions for clarification as appropriate. The next appointment will be scheduled in two weeks. The next session will focus on reviewing triggers for emotional eating and working towards the established treatment goal.

## 2017-11-14 NOTE — Progress Notes (Signed)
Office: [phone]  /  Fax: [phone]   HPI:   Chief Complaint: OBESITY Selena Flores is here to discuss her progress with her obesity treatment plan. She is following the Category 3 plan and is following her eating plan approximately 80 % of the time. She states she is exercising 0 minutes 0 times per week. Selena Flores continues to do well with weight loss on her Category 3 plan but is getting bored with dinner. She would like more options.   Her weight is 260 lb (117.9 kg) today and has had a weight loss of 3 pounds over a period of 3 weeks since her last visit. She has lost 6 lbs since starting treatment with [place].  Pre-Diabetes Selena Flores has a diagnosis of prediabetes based on her elevated HgA1c and was informed this puts her at greater risk of developing diabetes. Selena Flores is taking Metformin and had some Gi upset the first 3 days after starting but this resolved. Selena Flores is doing well on diet and continues to work on exercise to decrease risk of diabetes. She denies nausea or hypoglycemia.   At risk for diabetes Selena Flores is at higher than averagerisk for developing diabetes due to her obesity and pre-diabetes. She currently denies polyuria or polydipsia.  Vitamin D deficiency Selena Flores has a diagnosis of vitamin D deficiency. She is currently stable on prescription Vit D though not yet at goal. Selena Flores denies nausea, vomiting or muscle weakness.    ALLERGIES: Allergies  Allergen Reactions  . Ciprofloxacin Itching  . Rocephin [Ceftriaxone] Itching  . Penicillins Itching and Rash    Has patient had a PCN reaction causing immediate rash, facial/tongue/throat swelling, SOB or lightheadedness with hypotension: Unknown Has patient had a PCN reaction causing severe rash involving mucus membranes or skin necrosis: Unknown Has patient had a PCN reaction that required hospitalization Unknown Has patient had a PCN reaction occurring within the last 10 years: Yes If all of the above answers  are "NO", then may proceed with Cephalosporin use.     MEDICATIONS: Current Outpatient Medications on File Prior to Visit  Medication Sig Dispense Refill  . aspirin-acetaminophen-caffeine (EXCEDRIN MIGRAINE) 250-250-65 MG tablet Take 1 tablet by mouth every 6 (six) hours as needed for headache.    . calcium carbonate (TUMS - DOSED IN MG ELEMENTAL CALCIUM) 500 MG chewable tablet Chew 1 tablet by mouth daily as needed for indigestion or heartburn.    . Norgestimate-Ethinyl Estradiol Triphasic 0.18/0.215/0.25 MG-35 MCG tablet Take 1 tablet by mouth daily.    Marland Kitchen omeprazole (PRILOSEC) 20 MG capsule Take 20 mg by mouth daily as needed.     No current facility-administered medications on file prior to visit.     PAST MEDICAL HISTORY: Past Medical History:  Diagnosis Date  . Anxiety   . Chronic kidney disease    kidney infection 2008 ish  . Constipation   . Food allergy    onions itching  . GERD (gastroesophageal reflux disease)   . Hyperlipidemia   . Migraines     PAST SURGICAL HISTORY: Past Surgical History:  Procedure Laterality Date  . CERVICAL CONE BIOPSY    . TONSILLECTOMY      SOCIAL HISTORY: Social History   Tobacco Use  . Smoking status: Never Smoker  . Smokeless tobacco: Never Used  . Tobacco comment: smoked for 1 yr socially in college  Substance Use Topics  . Alcohol use: Yes    Comment: social   . Drug use: Yes    FAMILY HISTORY: Family History  Problem Relation Age of Onset  . Heart disease Mother        chf  . Obesity Mother   . Anxiety disorder Mother   . Skin cancer Father   . Cancer Father        skin cancer    ROS: Review of Systems  Constitutional: Positive for weight loss.  Gastrointestinal: Negative for nausea and vomiting.  Musculoskeletal:       Negative muscle weakness  Endo/Heme/Allergies: Negative for polydipsia.       Negative for hypoglycemia Negative for polyuria    PHYSICAL EXAM: Blood pressure 104/71, pulse 82,  temperature 97.7 F (36.5 C), temperature source Oral, height 5\' 3"  (1.6 m), weight 260 lb (117.9 kg), SpO2 99 %. Body mass index is 46.06 kg/m. Physical Exam  Constitutional: She is oriented to person, place, and time. She appears well-developed and well-nourished.  Cardiovascular: Normal rate.  Pulmonary/Chest: Effort normal.  Musculoskeletal: Normal range of motion.  Neurological: She is oriented to person, place, and time.  Skin: Skin is warm and dry.  Psychiatric: She has a normal mood and affect. Her behavior is normal.  Vitals reviewed.   RECENT LABS AND TESTS: BMET    Component Value Date/Time   NA 136 10/11/2017 0849   K 5.1 10/11/2017 0849   CL 98 10/11/2017 0849   CO2 23 10/11/2017 0849   GLUCOSE 87 10/11/2017 0849   GLUCOSE 106 (H) 04/06/2015 1136   BUN 12 10/11/2017 0849   CREATININE 0.79 10/11/2017 0849   CALCIUM 9.2 10/11/2017 0849   GFRNONAA 98 10/11/2017 0849   GFRAA 113 10/11/2017 0849   Lab Results  Component Value Date   HGBA1C 5.4 10/11/2017   Lab Results  Component Value Date   INSULIN 10.3 10/11/2017   CBC    Component Value Date/Time   WBC 6.6 10/11/2017 0849   WBC 10.5 04/06/2015 1136   RBC 4.85 10/11/2017 0849   RBC 5.07 04/06/2015 1136   HGB 14.1 10/11/2017 0849   HCT 42.4 10/11/2017 0849   PLT 285 04/06/2015 1136   MCV 87 10/11/2017 0849   MCH 29.1 10/11/2017 0849   MCH 29.4 04/06/2015 1136   MCHC 33.3 10/11/2017 0849   MCHC 32.4 04/06/2015 1136   RDW 13.1 10/11/2017 0849   LYMPHSABS 1.7 10/11/2017 0849   MONOABS 1.1 (H) 02/28/2007 0247   EOSABS 0.0 10/11/2017 0849   BASOSABS 0.0 10/11/2017 0849   Iron/TIBC/Ferritin/ %Sat No results found for: IRON, TIBC, FERRITIN, IRONPCTSAT Lipid Panel     Component Value Date/Time   CHOL 225 (H) 10/11/2017 0849   TRIG 109 10/11/2017 0849   HDL 70 10/11/2017 0849   LDLCALC 133 (H) 10/11/2017 0849   Hepatic Function Panel     Component Value Date/Time   PROT 7.1 10/11/2017 0849    ALBUMIN 3.9 10/11/2017 0849   AST 14 10/11/2017 0849   ALT 16 10/11/2017 0849   ALKPHOS 136 (H) 10/11/2017 0849   BILITOT 0.4 10/11/2017 0849      Component Value Date/Time   TSH 2.950 10/11/2017 0849  Results for Selena Flores (MRN [id number]) as of 11/14/2017 12:20  Ref. Range 10/11/2017 08:49  Vitamin D, 25-Hydroxy Latest Ref Range: 30.0 - 100.0 ng/mL 29.2 (L)    ASSESSMENT AND PLAN: Vitamin D deficiency - Plan: Vitamin D, Ergocalciferol, (DRISDOL) 50000 units CAPS capsule  Prediabetes - Plan: metFORMIN (GLUCOPHAGE) 500 MG tablet  At risk for diabetes mellitus  Class 3 severe obesity with serious comorbidity and  body mass index (BMI) of 45.0 to 49.9 in adult, unspecified obesity type [facility])  PLAN: Pre-Diabetes Quinesha will continue to work on weight loss, exercise, and decreasing simple carbohydrates in her diet to help decrease the risk of diabetes. She will continue Metformin 500 mg qd #30 with no refills.  She was informed that eating too many simple carbohydrates or too many calories at one sitting increases the likelihood of GI side effects. Emberlynn agrees to follow up in our office in 3 weeks.   Diabetes risk counselling Selena Flores was given extended (15 minutes) diabetes prevention counseling today. She is 37 y.o. female and has risk factors for diabetes including obesity. We discussed intensive lifestyle modifications today with an emphasis on weight loss as well as increasing exercise and decreasing simple carbohydrates in her diet.  Vitamin D Deficiency Selena Flores was informed that low vitamin D levels contributes to fatigue and are associated with obesity, breast, and colon cancer. She will continue taking prescription Vit D @50 ,000 IU every week #4 with no refills. She will follow up for routine testing of vitamin D, at least 2-3 times per year. She was informed of the risk of over-replacement of vitamin D and agrees to not increase her dose unless she discusses this  with [place] first. Selena Flores agrees to follow up in our office in 3 weeks.   Obesity Selena Flores is currently in the action stage of change. As such, her goal is to continue with weight loss efforts She has agreed to follow the Category 3 plan and food journal 400-600 calories and 40 gm protein at supper Selena Flores has been instructed to work up to a goal of 150 minutes of combined cardio and strengthening exercise per week for weight loss and overall health benefits. We discussed the following Behavioral Modification Strategies today: increasing lean protein intake, decreasing simple carbohydrates  and work on meal planning and easy cooking plans  Selena Flores has agreed to follow up with our clinic in 3 weeks. She was informed of the importance of frequent follow up visits to maximize her success with intensive lifestyle modifications for her multiple health conditions.   OBESITY BEHAVIORAL INTERVENTION VISIT  Today's visit was # 3   Starting weight: 266 lbs  Starting date: 10/10/2017 Today's weight : 260 lbs  Today's date: 11/11/2017 Total lbs lost to date: 6 lbs    ASK: We discussed the diagnosis of obesity with Selena Flores today and Selena Flores agreed to give [place] permission to discuss obesity behavioral modification therapy today.  ASSESS: Selena Flores has the diagnosis of obesity and her BMI today is 46.07 Selena Flores is in the action stage of change   ADVISE: Selena Flores was educated on the multiple health risks of obesity as well as the benefit of weight loss to improve her health. She was advised of the need for long term treatment and the importance of lifestyle modifications to improve her current health and to decrease her risk of future health problems.  AGREE: Multiple dietary modification options and treatment options were discussed and  Selena Flores agreed to follow the recommendations documented in the above note.  ARRANGE: Selena Flores was educated on the importance of frequent visits to  treat obesity as outlined per CMS and USPSTF guidelines and agreed to schedule her next follow up appointment today.  Raelene Bott, am acting as transcriptionist for Quillian Quince, MD  I have reviewed the above documentation for accuracy and completeness, and I agree with the above. -Quillian Quince, MD

## 2017-11-18 ENCOUNTER — Encounter (INDEPENDENT_AMBULATORY_CARE_PROVIDER_SITE_OTHER): Payer: Self-pay | Admitting: Family Medicine

## 2017-11-20 NOTE — Progress Notes (Unsigned)
Office: [phone]  /  Fax: [phone]   Date: November 28, 2017 Time Seen:*** Duration:*** Provider: Lawerance Cruel, Psy.D. Type of Session: Individual Therapy   HPI: [name] referred by Dr. Quillian Quince and seen for an initial appointment by this provider on October 24, 2017. Per the note for the initial visit withDr. Darrel Hoover October 10, 2017,"Michellehad a positive depression screening with significant emotional eating and unhealthy relationship with food. She also shows signs of body dysmorphia." In addition, per the note for the initial visit withDr. Darrel Hoover October 10, 2017,Selena Flores has been heavy most of her lifeandshe started gaining weight after the age of[age] or so. Her heaviest weight ever was 285 pounds. During the initial appointment with Dr. Dalbert Garnet, Selena Flores reported experiencing the following:significant food craving issues; frequently drinking liquids with calories; frequently making poor food choices; and struggling with emotional eating. She also described herself as a picky eater and does not like to eat healthier foods.  She during the initial appointment with this provider,Selena Flores stated, "My regulardoctorreferred to me to Dr. Renae Gloss in April." She explained she previously lost weight, but could not keep it off. Regarding the appointment with this provider, Selena Flores reported she hopes to talk about her eating habits related to her mood. As it relates to emotional eating, Selena Flores explained she overeats when celebrating, and noted, "I'm more likely to over drink when celebrating." She also described eating when experiencing unpleasant emotions for comfort. Selena Flores explained she does not often over eat, rather makes choices that are not healthy. 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 Moreover, Selena Flores explained a history of anxiety and she recently discontinued Zoloft. Currently, she and her primary care  physician are in a "debate" to determine if she should be prescribed a psychotropic medication again. Since starting with the clinic, Selena Flores noted if she experiences emotional hunger, she will eat foods on her meal plan.Selena Flores denied a history of binge eating. She denied a history of purging and engagement in other compensatory strategies. Selena Flores indicated she has never been diagnosed with an eating disorder. Additionally, Selena Flores discussed a history of eating fast food starting as a child; therefore, she has gotten used to "eating out all the time." Furthermore, [name] asked to complete a questionnaire assessing various behaviors related to emotional eating. Michelleendorsed the following: overeat when you are celebrating, experience food cravings on a regular basis, eat certain foods when you are anxious, stressed, depressed, or your feelings are hurt, find food is comforting to you, overeat when you are angry or upset, overeat frequently when you are bored or lonely, not worry about what you eat when you are in a good mood, eat to help you stay awake and eat as a reward.  Session Content: Session focused on the following treatment goal: decrease emotional eating. The session was initiated with the administration of the PHQ-9 and GAD-7, as well as a brief check-in.   Selena Flores was receptive to today's session as evidenced by ***.  Mental Status Examination: Selena Flores arrived on time for the appointment. She presented as appropriately dressed and groomed. Selena Flores appeared her stated age and demonstrated adequate orientation to time, place, person, and purpose of the appointment. She also demonstrated appropriate eye contact. No psychomotor abnormalities or behavioral peculiarities noted. Her mood was {gbmood:21757} with congruent affect. Her thought processes were logical, linear, and goal-directed. No hallucinations, delusions, bizarre thinking or behavior reported or observed. Judgment,  insight, and impulse control appeared to be grossly intact. There was no  evidence of paraphasias (i.e., errors in speech, gross mispronunciations, and word substitutions), repetition deficits, or disturbances in volume or prosody (i.e., rhythm and intonation). There was no evidence of attention or memory impairments. Selena Flores denied current suicidal and homicidal ideation, intent or plan.  Structured Assessment Results: The Patient Health Questionnaire-9 (PHQ-9) is a self-report measure that assesses symptoms and severity of depression over the course of the last two weeks. Selena Flores obtained a score of *** suggesting {GBPHQ9SEVERITY:21752}. Selena Flores finds the endorsed symptoms to be {gbphq9difficulty:21754}.  The Generalized Anxiety Disorder-7 (GAD-7) is a brief self-report measure that assesses symptoms of anxiety over the course of the last two weeks. Selena Flores obtained a score of *** suggesting {gbgad7severity:21753}.  Interventions: Selena Flores was administered the PHQ-9 and GAD-7 for symptom monitoring. Content from the last session was reviewed. Throughout today's session, empathic reflections and validation were provided. Psychoeducation regarding *** was provided and *** [insert other interventions].   DSM-5 Diagnosis: 296.31 (F33.0) Major Depressive Disorder, Recurrent Episode, Mild, With Anxious Distress, Mild  Treatment Goal & Progress: Selena Flores was seen for an initial appointment with this provider on October 24, 2017 during which the following treatment goal was established: decrease emotional eating. Selena Flores has demonstrated progress in her goal of decreasing emotional eating as evidenced by ***  Plan: Selena Flores continues to appear able and willing to participate as evidenced by engagement in reciprocal conversation, and asking questions for clarification as appropriate.*** The next appointment will be scheduled in {gbweeks:21758}. The next session will focus on reviewing learned skills, and  working towards the established treatment goal.***

## 2017-11-28 ENCOUNTER — Ambulatory Visit (INDEPENDENT_AMBULATORY_CARE_PROVIDER_SITE_OTHER): Payer: BLUE CROSS/BLUE SHIELD | Admitting: Family Medicine

## 2017-11-28 ENCOUNTER — Ambulatory Visit (INDEPENDENT_AMBULATORY_CARE_PROVIDER_SITE_OTHER): Payer: Self-pay | Admitting: Psychology

## 2017-11-28 ENCOUNTER — Encounter: Payer: Self-pay | Admitting: Family Medicine

## 2017-11-28 DIAGNOSIS — M26609 Unspecified temporomandibular joint disorder, unspecified side: Secondary | ICD-10-CM

## 2017-11-28 DIAGNOSIS — M722 Plantar fascial fibromatosis: Secondary | ICD-10-CM

## 2017-11-28 DIAGNOSIS — E559 Vitamin D deficiency, unspecified: Secondary | ICD-10-CM

## 2017-11-28 HISTORY — DX: Unspecified temporomandibular joint disorder, unspecified side: M26.609

## 2017-11-28 HISTORY — DX: Vitamin D deficiency, unspecified: E55.9

## 2017-11-28 HISTORY — DX: Plantar fascial fibromatosis: M72.2

## 2017-11-28 MED ORDER — MELOXICAM 15 MG PO TABS: 15.0000 mg | ORAL_TABLET | Freq: Every day | ORAL | 1 refills | Status: DC | PRN

## 2017-11-28 NOTE — Assessment & Plan Note (Addendum)
L>R. Try topical lidocaine and a night guard may need to discuss with her dentist

## 2017-11-28 NOTE — Assessment & Plan Note (Signed)
Supplementing and monitoring with bariatrics

## 2017-11-28 NOTE — Patient Instructions (Addendum)
Ices, lidocaine. Dr Cipriano Bunker shoe inserts  Heel Spur A heel spur is a bony growth that forms on the bottom of your heel bone (calcaneus). Heel spurs are common and do not always cause pain. However, heel spurs often cause inflammation in the strong band of tissue that runs underneath the bone of your foot (plantar fascia). When this happens, you may feel pain on the bottom of your foot, near your heel. What are the causes? The cause of heel spurs is not completely understood. They may be caused by pressure on the heel. Or, they may stem from the muscle attachments (tendons) near the spur pulling on the heel. What increases the risk? You may be at risk for a heel spur if you:  Are older than 40.  Are overweight.  Have wear and tear arthritis (osteoarthritis).  Have plantar fascia inflammation.  What are the signs or symptoms? Some people have heel spurs but no symptoms. If you do have symptoms, they may include:  Pain in the bottom of your heel.  Pain that is worse when you first get out of bed.  Pain that gets worse after walking or standing.  How is this diagnosed? Your health care provider may diagnose a heel spur based on your symptoms and a physical exam. You may also have an X-ray of your foot to check for a bony growth coming from the calcaneus. How is this treated? Treatment aims to relieve the pain from the heel spur. This may include:  Stretching exercises.  Losing weight.  Wearing specific shoes, inserts, or orthotics for comfort and support.  Wearing splints at night to properly position your feet.  Taking over-the-counter medicine to relieve pain.  Being treated with high-intensity sound waves to break up the heel spur (extracorporeal shock wave therapy).  Getting steroid injections in your heel to reduce swelling and ease pain.  Having surgery if your heel spur causes long-term (chronic) pain.  Follow these instructions at home:  Take medicines only as  directed by your health care provider.  Ask your health care provider if you should use ice or cold packs on the painful areas of your heel or foot.  Avoid activities that cause you pain until you recover or as directed by your health care provider.  Stretch before exercising or being physically active.  Wear supportive shoes that fit well as directed by your health care provider. You might need to buy new shoes. Wearing old shoes or shoes that do not fit correctly may not provide the support that you need.  Lose weight if your health care provider thinks you should. This can relieve pressure on your foot that may be causing pain and discomfort. Contact a health care provider if:  Your pain continues or gets worse. This information is not intended to replace advice given to you by your health care provider. Make sure you discuss any questions you have with your health care provider. Document Released: 02/14/2005 Document Revised: 06/16/2015 Document Reviewed: 03/11/2013 Elsevier Interactive Patient Education  2018 [employer] Plantar Fasciitis Plantar fasciitis is a painful foot condition that affects the heel. It occurs when the band of tissue that connects the toes to the heel bone (plantar fascia) becomes irritated. This can happen after exercising too much or doing other repetitive activities (overuse injury). The pain from plantar fasciitis can range from mild irritation to severe pain that makes it difficult for you to walk or move. The pain is usually worse in the morning or  after you have been sitting or lying down for a while. What are the causes? This condition may be caused by:  Standing for long periods of time.  Wearing shoes that do not fit.  Doing high-impact activities, including running, aerobics, and ballet.  Being overweight.  Having an abnormal way of walking (gait).  Having tight calf muscles.  Having high arches in your feet.  Starting a new athletic  activity.  What are the signs or symptoms? The main symptom of this condition is heel pain. Other symptoms include:  Pain that gets worse after activity or exercise.  Pain that is worse in the morning or after resting.  Pain that goes away after you walk for a few minutes.  How is this diagnosed? This condition may be diagnosed based on your signs and symptoms. Your health care provider will also do a physical exam to check for:  A tender area on the bottom of your foot.  A high arch in your foot.  Pain when you move your foot.  Difficulty moving your foot.  You may also need to have imaging studies to confirm the diagnosis. These can include:  X-rays.  Ultrasound.  MRI. Lidocaine gel and stretching with crocs and send a message if you would like to go see a podiatrist Stress can flare TMJ, try a mouth  Jaw Dislocation Jaw dislocation is displacement of the joint at the place where the upper jawbone (maxilla) and the lower jawbone (mandible) meet (temporomandibular joint). Soon after the dislocation, the jaw muscles tighten. This makes it difficult to close the mouth normally. This may also cause pain in the jaw. What are the causes? Common causes of this condition include:  A hard, direct hit or injury (trauma) to the jaw.  Opening the mouth too wide. This can be caused by: ? Eating. ? Yawning. ? Vomiting. ? Having a dental procedure. ? Receiving medicine by mouth to make you fall asleep (general anesthetic).  What increases the risk? This condition is more likely to happen to people:  Who have dislocated their jaws before.  Who participate in contact sports.  Who have a jaw that is loose or can move beyond the normal range.  What are the signs or symptoms? Symptoms of this condition vary depending on the severity of the dislocation. Symptoms may include:  Severe pain.  Inability to move your jaw or close your mouth completely.  Feeling that your teeth do  not line up as they usually do when you bite.  Drooling.  Difficulty speaking or swallowing.  How is this diagnosed? This condition is diagnosed based on your medical history and a physical exam. Your health care provider will ask you to move your jaw, and he or she will feel your temporomandibular joints. Your health care provider will also check the inside of your mouth for:  Breaks (fractures) in your jaw bone.  Cuts (lacerations).  How is this treated? The most common treatment for this condition is to have your health care provider move your jaw back into place by hand (manual reduction). After doing a manual reduction, your health care provider may apply a special type of bandage (Barton bandage) to stabilize your jaw while it heals. Your health care provider may recommend:  NSAIDs to reduce pain and swelling.  Medicines to relax your muscles.  A neck brace to support your mandible.  Follow these instructions at home: Injury care  If you were given a neck brace or a Laurence Compton  bandage, wear it as told by your health care provider.  If directed, apply ice to the injured area: ? Put ice in a plastic bag. ? Place a towel between your skin and the bag. ? Leave the ice on for 20 minutes, 2-3 times per day. General instructions  Take over-the-counter and prescription medicines only as told by your health care provider.  Rest your jaw as told by your health care provider.  Avoid activities similar to the one that caused your injury.  Do not open your mouth wide until your health care provider says that it is safe for you to do that.  If you need to sneeze or yawn, place a hand under your jaw to prevent it from opening too wide.  Follow instructions from your health care provider about eating or drinking restrictions while your jaw is healing.These may include: ? Eating soft foods. ? Eating liquefied foods. ? Eating food that has been cut into small pieces.  Keep all  follow-up visits as told by your health care provider. This is important. Contact a health care provider if:  Your pain gets worse or it does not get better with medicines. Get help right away if:  Your jaw becomes dislocated again.  You have difficulty breathing. This information is not intended to replace advice given to you by your health care provider. Make sure you discuss any questions you have with your health care provider. Document Released: 01/06/2000 Document Revised: 06/16/2015 Document Reviewed: 06/07/2014 Elsevier Interactive Patient Education  [employer].   How is this treated? Treatment for plantar fasciitis depends on the severity of the condition. Your treatment may include:  Rest, ice, and over-the-counter pain medicines to manage your pain.  Exercises to stretch your calves and your plantar fascia.  A splint that holds your foot in a stretched, upward position while you sleep (night splint).  Physical therapy to relieve symptoms and prevent problems in the future.  Cortisone injections to relieve severe pain.  Extracorporeal shock wave therapy (ESWT) to stimulate damaged plantar fascia with electrical impulses. It is often used as a last resort before surgery.  Surgery, if other treatments have not worked after 12 months.  Follow these instructions at home:  Take medicines only as directed by your health care provider.  Avoid activities that cause pain.  Roll the bottom of your foot over a bag of ice or a bottle of cold water. Do this for 20 minutes, 3-4 times a day.  Perform simple stretches as directed by your health care provider.  Try wearing athletic shoes with air-sole or gel-sole cushions or soft shoe inserts.  Wear a night splint while sleeping, if directed by your health care provider.  Keep all follow-up appointments with your health care provider. How is this prevented?  Do not perform exercises or activities that cause heel  pain.  Consider finding low-impact activities if you continue to have problems.  Lose weight if you need to. The best way to prevent plantar fasciitis is to avoid the activities that aggravate your plantar fascia. Contact a health care provider if:  Your symptoms do not go away after treatment with home care measures.  Your pain gets worse.  Your pain affects your ability to move or do your daily activities. This information is not intended to replace advice given to you by your health care provider. Make sure you discuss any questions you have with your health care provider. Document Released: 10/03/2000 Document Revised: 06/13/2015 Document  Reviewed: 11/18/2013 Elsevier Interactive Patient Education  [employer].

## 2017-11-28 NOTE — Progress Notes (Signed)
Subjective:    Patient ID: Selena Flores, female    DOB: 01/27/1980, 37 y.o.   MRN: [id number]  Chief Complaint  Patient presents with  . Jaw Pain    Complains of jaw pain on left side of face, mostly in the monring  . Foot Pain    Complains of left foot pain    HPI Patient is in today for evaluation of left heel pain and left temporomandibular joint pain.  She has had trouble with her left heel over the last several weeks she has tried some adjustments to foot wear etc. but the pain persists.  It is painful for periods of time throughout the day and when she first stands on it.  No fall or injury.  She also notes pain over her temporomandibular joint.  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  No fevers or chills no acute illness.  She is following with healthy weight and wellness and is happy with her weight loss. Denies CP/HA/congestion/fevers/GI or GU c/o. Taking meds as prescribed  Past Medical History:  Diagnosis Date  . Anxiety   . Chronic kidney disease    kidney infection 2008 ish  . Constipation   . Food allergy    onions itching  . GERD (gastroesophageal reflux disease)   . Hyperlipidemia   . Migraines     Past Surgical History:  Procedure Laterality Date  . CERVICAL CONE BIOPSY    . TONSILLECTOMY      Family History  Problem Relation Age of Onset  . Heart disease Mother        chf  . Obesity Mother   . Anxiety disorder Mother   . Skin cancer Father   . Cancer Father        skin cancer    Social History   Socioeconomic History  . Marital status: Significant Other    Spouse name: Ashana Tullo  . Number of children: 0  . Years of education: Not on file  . Highest education level: Not on file  Occupational History  . Occupation: Arts development officer    Comment: Customer Service   Social Needs  . Financial resource strain: Not on file  . Food insecurity:   Worry: Not on file    Inability: Not on file  . Transportation needs:    Medical: Not on file    Non-medical: Not on file  Tobacco Use  . Smoking status: Never Smoker  . Smokeless tobacco: Never Used  . Tobacco comment: smoked for 1 yr socially in college  Substance and Sexual Activity  . Alcohol use: Yes    Comment: social   . Drug use: Yes  . Sexual activity: Yes  Lifestyle  . Physical activity:    Days per week: Not on file    Minutes per session: Not on file  . Stress: Not on file  Relationships  . Social connections:    Talks on phone: Not on file    Gets together: Not on file    Attends religious service: Not on file    Active member of club or organization: Not on file    Attends meetings of clubs or organizations: Not on file    Relationship status: Not on file  . Intimate partner violence:    Fear of current or ex partner: Not on file    Emotionally abused: Not on file  Physically abused: Not on file    Forced sexual activity: Not on file  Other Topics Concern  . Not on file  Social History Narrative   Lives with boyfriend, has 3 dogs, works in Clinical biochemist with [employer]   No dietary restrictions, walking    Outpatient Medications Prior to Visit  Medication Sig Dispense Refill  . aspirin-acetaminophen-caffeine (EXCEDRIN MIGRAINE) 250-250-65 MG tablet Take 1 tablet by mouth every 6 (six) hours as needed for headache.    . calcium carbonate (TUMS - DOSED IN MG ELEMENTAL CALCIUM) 500 MG chewable tablet Chew 1 tablet by mouth daily as needed for indigestion or heartburn.    . metFORMIN (GLUCOPHAGE) 500 MG tablet Take 1 tablet (500 mg total) by mouth daily with breakfast. 30 tablet 0  . Norgestimate-Ethinyl Estradiol Triphasic 0.18/0.215/0.25 MG-35 MCG tablet Take 1 tablet by mouth daily.    Marland Kitchen omeprazole (PRILOSEC) 20 MG capsule Take 20 mg by mouth daily as needed.    . Vitamin D, Ergocalciferol, (DRISDOL) 50000 units CAPS capsule Take 1 capsule (50,000  Units total) by mouth every 7 (seven) days. 4 capsule 0   No facility-administered medications prior to visit.     Allergies  Allergen Reactions  . Ciprofloxacin Itching  . Rocephin [Ceftriaxone] Itching  . Penicillins Itching and Rash    Has patient had a PCN reaction causing immediate rash, facial/tongue/throat swelling, SOB or lightheadedness with hypotension: Unknown Has patient had a PCN reaction causing severe rash involving mucus membranes or skin necrosis: Unknown Has patient had a PCN reaction that required hospitalization Unknown Has patient had a PCN reaction occurring within the last 10 years: Yes If all of the above answers are "NO", then may proceed with Cephalosporin use.     Review of Systems  Constitutional: Negative for fever and malaise/fatigue.  HENT: Negative for congestion.   Eyes: Negative for blurred vision.  Respiratory: Negative for shortness of breath.   Cardiovascular: Negative for chest pain, palpitations and leg swelling.  Gastrointestinal: Negative for abdominal pain, blood in stool and nausea.  Genitourinary: Negative for dysuria and frequency.  Musculoskeletal: Positive for joint pain. Negative for falls.  Skin: Negative for rash.  Neurological: Negative for dizziness, loss of consciousness and headaches.  Endo/Heme/Allergies: Negative for environmental allergies.  Psychiatric/Behavioral: Negative for depression. The patient is not nervous/anxious.        Objective:    Physical Exam  Constitutional: She is oriented to person, place, and time. She appears well-developed and well-nourished. No distress.  HENT:  Head: Normocephalic and atraumatic.  Nose: Nose normal.  Eyes: Right eye exhibits no discharge. Left eye exhibits no discharge.  Neck: Normal range of motion. Neck supple.  Cardiovascular: Normal rate and regular rhythm.  No murmur heard. Pulmonary/Chest: Effort normal and breath sounds normal.  Abdominal: Soft. Bowel sounds are  normal. There is no tenderness.  Musculoskeletal: She exhibits no edema.  Pain with palpation left TMJ joint and left heal  Neurological: She is alert and oriented to person, place, and time.  Skin: Skin is warm and dry.  Psychiatric: She has a normal mood and affect.  Nursing note and vitals reviewed.   BP 116/71 (BP Location: Right Arm, Patient Position: Sitting, Cuff Size: Large)   Pulse 88   Temp 98.4 F (36.9 C) (Oral)   Resp 16   Ht 5\' 3"  (1.6 m)   Wt 259 lb (117.5 kg)   SpO2 98%   BMI 45.88 kg/m  Wt Readings from Last 3  Encounters:  11/28/17 259 lb (117.5 kg)  11/11/17 260 lb (117.9 kg)  10/24/17 263 lb (119.3 kg)     Lab Results  Component Value Date   WBC 6.6 10/11/2017   HGB 14.1 10/11/2017   HCT 42.4 10/11/2017   PLT 285 04/06/2015   GLUCOSE 87 10/11/2017   CHOL 225 (H) 10/11/2017   TRIG 109 10/11/2017   HDL 70 10/11/2017   LDLCALC 133 (H) 10/11/2017   ALT 16 10/11/2017   AST 14 10/11/2017   NA 136 10/11/2017   K 5.1 10/11/2017   CL 98 10/11/2017   CREATININE 0.79 10/11/2017   BUN 12 10/11/2017   CO2 23 10/11/2017   TSH 2.950 10/11/2017   INR 0.9 05/12/2008   HGBA1C 5.4 10/11/2017    Lab Results  Component Value Date   TSH 2.950 10/11/2017   Lab Results  Component Value Date   WBC 6.6 10/11/2017   HGB 14.1 10/11/2017   HCT 42.4 10/11/2017   MCV 87 10/11/2017   PLT 285 04/06/2015   Lab Results  Component Value Date   NA 136 10/11/2017   K 5.1 10/11/2017   CO2 23 10/11/2017   GLUCOSE 87 10/11/2017   BUN 12 10/11/2017   CREATININE 0.79 10/11/2017   BILITOT 0.4 10/11/2017   ALKPHOS 136 (H) 10/11/2017   AST 14 10/11/2017   ALT 16 10/11/2017   PROT 7.1 10/11/2017   ALBUMIN 3.9 10/11/2017   CALCIUM 9.2 10/11/2017   ANIONGAP 10 04/06/2015   Lab Results  Component Value Date   CHOL 225 (H) 10/11/2017   Lab Results  Component Value Date   HDL 70 10/11/2017   Lab Results  Component Value Date   LDLCALC 133 (H) 10/11/2017   Lab  Results  Component Value Date   TRIG 109 10/11/2017   No results found for: CHOLHDL Lab Results  Component Value Date   HGBA1C 5.4 10/11/2017       Assessment & Plan:   Problem List Items Addressed This Visit    Vitamin D deficiency    Supplementing and monitoring with bariatrics      Plantar fasciitis    Left foot. Started on Meloxicam, icing, stretching, orthotics and good shooes. Try topical lidocaine and consider podiatry if no improvement      TMJ (temporomandibular joint syndrome)    L>R. Try topical lidocaine and a night guard may need to discuss with her dentist         I am having Tinie L. Wooten start on meloxicam. I am also having her maintain her Norgestimate-Ethinyl Estradiol Triphasic, aspirin-acetaminophen-caffeine, omeprazole, calcium carbonate, metFORMIN, and Vitamin D (Ergocalciferol).  Meds ordered this encounter  Medications  . meloxicam (MOBIC) 15 MG tablet    Sig: Take 1 tablet (15 mg total) by mouth daily as needed for pain.    Dispense:  30 tablet    Refill:  1     Danise Edge, MD

## 2017-11-28 NOTE — Assessment & Plan Note (Signed)
Left foot. Started on Meloxicam, icing, stretching, orthotics and good shooes. Try topical lidocaine and consider podiatry if no improvement

## 2017-12-02 ENCOUNTER — Ambulatory Visit (INDEPENDENT_AMBULATORY_CARE_PROVIDER_SITE_OTHER): Payer: BLUE CROSS/BLUE SHIELD | Admitting: Family Medicine

## 2017-12-02 VITALS — BP 112/78 | HR 77 | Temp 97.7°F | Ht 63.0 in | Wt 253.0 lb

## 2017-12-02 DIAGNOSIS — R7303 Prediabetes: Secondary | ICD-10-CM

## 2017-12-02 DIAGNOSIS — Z6841 Body Mass Index (BMI) 40.0 and over, adult: Secondary | ICD-10-CM

## 2017-12-02 DIAGNOSIS — Z9189 Other specified personal risk factors, not elsewhere classified: Secondary | ICD-10-CM

## 2017-12-02 DIAGNOSIS — E559 Vitamin D deficiency, unspecified: Secondary | ICD-10-CM

## 2017-12-02 DIAGNOSIS — E66813 Obesity, class 3: Secondary | ICD-10-CM

## 2017-12-03 MED ORDER — VITAMIN D (ERGOCALCIFEROL) 1.25 MG (50000 UNIT) PO CAPS: 50000.0000 [IU] | ORAL_CAPSULE | ORAL | 0 refills | Status: DC

## 2017-12-03 MED ORDER — METFORMIN HCL 500 MG PO TABS: 500.0000 mg | ORAL_TABLET | Freq: Every day | ORAL | 0 refills | Status: DC

## 2017-12-04 ENCOUNTER — Ambulatory Visit (INDEPENDENT_AMBULATORY_CARE_PROVIDER_SITE_OTHER): Payer: BLUE CROSS/BLUE SHIELD | Admitting: Psychology

## 2017-12-04 ENCOUNTER — Encounter (INDEPENDENT_AMBULATORY_CARE_PROVIDER_SITE_OTHER): Payer: Self-pay | Admitting: Family Medicine

## 2017-12-04 DIAGNOSIS — F330 Major depressive disorder, recurrent, mild: Secondary | ICD-10-CM

## 2017-12-04 NOTE — Progress Notes (Signed)
Office: [phone]  /  Fax: [phone]   HPI:   Chief Complaint: OBESITY Selena Flores is here to discuss her progress with her obesity treatment plan. She is on the Category 3 plan and keep a food journal of 400 to 600 calories and 40 grams of protein for dinner and is following her eating plan approximately 90 % of the time. She states she is exercising 0 minutes 0 times per week. Selena Flores continues to do very well with weight loss on her Category 3 plan. She likes the freedom that journaling gives her for dinner. Hunger is controlled and she has questions about how to handle holiday temptations.  Her weight is 253 lb (114.8 kg) today and has had a weight loss of 7 pounds over a period of 3 weeks since her last visit. She has lost 13 lbs since starting treatment with Selena Flores.  Insulin Resistance Selena Flores has a diagnosis of insulin resistance based on her elevated fasting insulin level >5. Although Selena Flores's blood glucose readings are still under good control, insulin resistance puts her at greater risk of metabolic syndrome and diabetes. She is taking metformin and is stable. She continues to work on diet and exercise to decrease risk of diabetes. She denies nausea, vomiting, and hypoglycemia.  At risk for diabetes Selena Flores is at higher than average risk for developing diabetes due to her insulin resistance and obesity. She currently denies polyuria or polydipsia.  Vitamin D deficiency Selena Flores has a diagnosis of vitamin D deficiency. She is currently taking vit D and is stable. She denies nausea, vomiting, or muscle weakness.  ALLERGIES: Allergies  Allergen Reactions  . Ciprofloxacin Itching  . Rocephin [Ceftriaxone] Itching  . Penicillins Itching and Rash    Has patient had a PCN reaction causing immediate rash, facial/tongue/throat swelling, SOB or lightheadedness with hypotension: Unknown Has patient had a PCN reaction causing severe rash involving mucus membranes or skin necrosis:  Unknown Has patient had a PCN reaction that required hospitalization Unknown Has patient had a PCN reaction occurring within the last 10 years: Yes If all of the above answers are "NO", then may proceed with Cephalosporin use.     MEDICATIONS: Current Outpatient Medications on File Prior to Visit  Medication Sig Dispense Refill  . aspirin-acetaminophen-caffeine (EXCEDRIN MIGRAINE) 250-250-65 MG tablet Take 1 tablet by mouth every 6 (six) hours as needed for headache.    . calcium carbonate (TUMS - DOSED IN MG ELEMENTAL CALCIUM) 500 MG chewable tablet Chew 1 tablet by mouth daily as needed for indigestion or heartburn.    . meloxicam (MOBIC) 15 MG tablet Take 1 tablet (15 mg total) by mouth daily as needed for pain. 30 tablet 1  . Norgestimate-Ethinyl Estradiol Triphasic 0.18/0.215/0.25 MG-35 MCG tablet Take 1 tablet by mouth daily.    Selena Flores Kitchen omeprazole (PRILOSEC) 20 MG capsule Take 20 mg by mouth daily as needed.     No current facility-administered medications on file prior to visit.     PAST MEDICAL HISTORY: Past Medical History:  Diagnosis Date  . Anxiety   . Chronic kidney disease    kidney infection 2008 ish  . Constipation   . Food allergy    onions itching  . GERD (gastroesophageal reflux disease)   . Hyperlipidemia   . Migraines     PAST SURGICAL HISTORY: Past Surgical History:  Procedure Laterality Date  . CERVICAL CONE BIOPSY    . TONSILLECTOMY      SOCIAL HISTORY: Social History   Tobacco Use  .  Smoking status: Never Smoker  . Smokeless tobacco: Never Used  . Tobacco comment: smoked for 1 yr socially in college  Substance Use Topics  . Alcohol use: Yes    Comment: social   . Drug use: Yes    FAMILY HISTORY: Family History  Problem Relation Age of Onset  . Heart disease Mother        chf  . Obesity Mother   . Anxiety disorder Mother   . Skin cancer Father   . Cancer Father        skin cancer    ROS: Review of Systems  Constitutional: Positive  for weight loss.  Gastrointestinal: Negative for nausea and vomiting.  Genitourinary:       Negative for polyuria.  Musculoskeletal:       Negative for muscle weakness.  Endo/Heme/Allergies: Negative for polydipsia.       Negative for hypoglycemia.    PHYSICAL EXAM: Blood pressure 112/78, pulse 77, temperature 97.7 F (36.5 C), temperature source Oral, height 5\' 3"  (1.6 m), weight 253 lb (114.8 kg), SpO2 100 %. Body mass index is 44.82 kg/m. Physical Exam  Constitutional: She is oriented to person, place, and time. She appears well-developed and well-nourished.  Cardiovascular: Normal rate.  Pulmonary/Chest: Effort normal.  Musculoskeletal: Normal range of motion.  Neurological: She is oriented to person, place, and time.  Skin: Skin is warm and dry.  Psychiatric: She has a normal mood and affect. Her behavior is normal.  Vitals reviewed.   RECENT LABS AND TESTS: BMET    Component Value Date/Time   NA 136 10/11/2017 0849   K 5.1 10/11/2017 0849   CL 98 10/11/2017 0849   CO2 23 10/11/2017 0849   GLUCOSE 87 10/11/2017 0849   GLUCOSE 106 (H) 04/06/2015 1136   BUN 12 10/11/2017 0849   CREATININE 0.79 10/11/2017 0849   CALCIUM 9.2 10/11/2017 0849   GFRNONAA 98 10/11/2017 0849   GFRAA 113 10/11/2017 0849   Lab Results  Component Value Date   HGBA1C 5.4 10/11/2017   Lab Results  Component Value Date   INSULIN 10.3 10/11/2017   CBC    Component Value Date/Time   WBC 6.6 10/11/2017 0849   WBC 10.5 04/06/2015 1136   RBC 4.85 10/11/2017 0849   RBC 5.07 04/06/2015 1136   HGB 14.1 10/11/2017 0849   HCT 42.4 10/11/2017 0849   PLT 285 04/06/2015 1136   MCV 87 10/11/2017 0849   MCH 29.1 10/11/2017 0849   MCH 29.4 04/06/2015 1136   MCHC 33.3 10/11/2017 0849   MCHC 32.4 04/06/2015 1136   RDW 13.1 10/11/2017 0849   LYMPHSABS 1.7 10/11/2017 0849   MONOABS 1.1 (H) 02/28/2007 0247   EOSABS 0.0 10/11/2017 0849   BASOSABS 0.0 10/11/2017 0849   Iron/TIBC/Ferritin/  %Sat No results found for: IRON, TIBC, FERRITIN, IRONPCTSAT Lipid Panel     Component Value Date/Time   CHOL 225 (H) 10/11/2017 0849   TRIG 109 10/11/2017 0849   HDL 70 10/11/2017 0849   LDLCALC 133 (H) 10/11/2017 0849   Hepatic Function Panel     Component Value Date/Time   PROT 7.1 10/11/2017 0849   ALBUMIN 3.9 10/11/2017 0849   AST 14 10/11/2017 0849   ALT 16 10/11/2017 0849   ALKPHOS 136 (H) 10/11/2017 0849   BILITOT 0.4 10/11/2017 0849      Component Value Date/Time   TSH 2.950 10/11/2017 0849   Results for LATIFAH, PADIN (MRN [id number]) as of 12/04/2017 08:43  Ref.  Range 10/11/2017 08:49  Vitamin D, 25-Hydroxy Latest Ref Range: 30.0 - 100.0 ng/mL 29.2 (L)   ASSESSMENT AND PLAN: Prediabetes - Plan: metFORMIN (GLUCOPHAGE) 500 MG tablet  Vitamin D deficiency - Plan: Vitamin D, Ergocalciferol, (DRISDOL) 1.25 MG (50000 UT) CAPS capsule  At risk for diabetes mellitus  Class 3 severe obesity with serious comorbidity and body mass index (BMI) of 40.0 to 44.9 in adult, unspecified obesity type (HCC)  PLAN:  Insulin Resistance [name] will continue to work on weight loss, exercise, and decreasing simple carbohydrates in her diet to help decrease the risk of diabetes. She was informed that eating too many simple carbohydrates or too many calories at one sitting increases the likelihood of GI side effects. [name] agrees to continue metformin 500mg  qd #30 with no refills and prescription was written today. [name] agreed to follow up with us as directed to monitor her progress in 3 weeks.  Diabetes risk counseling [name] was given extended (15 minutes) diabetes prevention counseling today. She is 37 y.o. female and has risk factors for diabetes including insulin resistance and obesity. We discussed intensive lifestyle modifications today with an emphasis on weight loss as well as increasing exercise and decreasing simple carbohydrates in her diet.  Vitamin D  Deficiency [name] was informed that low vitamin D levels contributes to fatigue and are associated with obesity, breast, and colon cancer. She agrees to continue to take prescription Vit D @50 ,000 IU every week #4 with no refills and will follow up for routine testing of vitamin D, at least 2-3 times per year. She was informed of the risk of over-replacement of vitamin D and agrees to not increase her dose unless she discusses this with us first. [name] agrees to follow up as directed.  Obesity [name] is currently in the action stage of change. As such, her goal is to continue with weight loss efforts. She has agreed to follow the Category 3 plan and to keep a food journal of 400 to 600 calories and 40 grams of protein for dinner. [name] has been instructed to work up to a goal of 150 minutes of combined cardio and strengthening exercise per week for weight loss and overall health benefits. We discussed the following Behavioral Modification Strategies today: work on meal planning and easy cooking plans and holiday eating strategies.  [name] has agreed to follow up with our clinic in 3 weeks. She was informed of the importance of frequent follow up visits to maximize her success with intensive lifestyle modifications for her multiple health conditions.   OBESITY BEHAVIORAL INTERVENTION VISIT  Today's visit was # 6   Starting weight: 266 lbs Starting date: 10/10/17 Today's weight : Weight: 253 lb (114.8 kg)  Today's date: 12/02/2017 Total lbs lost to date: 13  ASK: We discussed the diagnosis of obesity with Selena PonderMichelle Lynn Flores today and [name] agreed to give us permission to discuss obesity behavioral modification therapy today.  ASSESS: [name] has the diagnosis of obesity and her BMI today is 44.83. [name] is in the action stage of change.   ADVISE: [name] was educated on the multiple health risks of obesity as well as the benefit of weight loss to improve her health.  She was advised of the need for long term treatment and the importance of lifestyle modifications to improve her current health and to decrease her risk of future health problems.  AGREE: Multiple dietary modification options and treatment options were discussed and [name] agreed to follow the recommendations documented in the  above note.  ARRANGE: Ashyia was educated on the importance of frequent visits to treat obesity as outlined per CMS and USPSTF guidelines and agreed to schedule her next follow up appointment today.  I, Kirke Corin, am acting as transcriptionist for Wilder Glade, MD  I have reviewed the above documentation for accuracy and completeness, and I agree with the above. -Quillian Quince, MD

## 2017-12-04 NOTE — Progress Notes (Signed)
Office: [phone]  /  Fax: [phone]   Date: December 04, 2017 Time Seen: 4:30pm Duration: 30 minutes Provider: Lawerance Cruel, Psy.D. Type of Session: Individual Therapy   HPI: [name] referred by Dr. Quillian Quince and seen for an initial appointment by this provider on October 24, 2017. Per the note for the initial visit withDr. Darrel Hoover October 10, 2017,"Michellehad a positive depression screening with significant emotional eating and unhealthy relationship with food. She also shows signs of body dysmorphia." In addition, per the note for the initial visit withDr. Darrel Hoover October 10, 2017,[name] has been heavy most of her lifeandshe started gaining weight after the age of[age] or so. Her heaviest weight ever was 285 pounds. During the initial appointment with Dr. Dalbert Garnet, Marcelino Duster reported experiencing the following:significant food craving issues; frequently drinking liquids with calories; frequently making poor food choices; and struggling with emotional eating. She also described herself as a picky eater and does not like to eat healthier foods.  She during the initial appointment with this provider,Selena Flores stated, "My regulardoctorreferred to me to Dr. Renae Gloss in April." She explained she previously lost weight, but could not keep it off. Regarding the appointment with this provider, Selena Flores reported she hopes to talk about her eating habits related to her mood. As it relates to emotional eating, Selena Flores explained she overeats when celebrating, and noted, "I'm more likely to over drink when celebrating." She also described eating when experiencing unpleasant emotions for comfort. Selena Flores explained she does not often over eat, rather makes choices that are not healthy. 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 Moreover, Selena Flores explained a history of anxiety and she recently discontinued Zoloft. Currently, she and her  primary care physician are in a "debate" to determine if she should be prescribed a psychotropic medication again. Since starting with the clinic, Selena Flores noted if she experiences emotional hunger, she will eat foods on her meal plan.[name] denied a history of binge eating. She denied a history of purging and engagement in other compensatory strategies. Selena Flores indicated she has never been diagnosed with an eating disorder. Additionally, Selena Flores discussed a history of eating fast food starting as a child; therefore, she has gotten used to "eating out all the time." Furthermore, [name] asked to complete a questionnaire assessing various behaviors related to emotional eating. Michelleendorsed the following: overeat when you are celebrating, experience food cravings on a regular basis, eat certain foods when you are anxious, stressed, depressed, or your feelings are hurt, find food is comforting to you, overeat when you are angry or upset, overeat frequently when you are bored or lonely, not worry about what you eat when you are in a good mood, eat to help you stay awake and eat as a reward.  Session Content: Session focused on the following treatment goal: decrease emotional eating. The session was initiated with the administration of the PHQ-9 and GAD-7, as well as a brief check-in. Selena Flores shared that she lost seven pounds since meeting with this provider. She explained she is currently experiencing a situational stressor related to finances; however, everything else is going well. She noted she was given the option to journal for dinner, which she described as "helpful." Since starting with the clinic, Selena Flores also discussed being more mindful of calories. Regarding triggers for emotional eating, Morning reported that she has not experienced triggers and she has not engaged in emotional eating since the last appointment with this provider. When experiencing stress, she reported she has opted to  play videogames instead. As such, this provider introduced [name] to pleasurable activities. Psychoeducation regarding the connection between thoughts, feelings, and behaviors was provided to further assist [name] in understanding the benefit of engagement in pleasurable activities. Handouts for the aforementioned were provided to [facility]. Remainder of session focused on psychoeducation related to common cognitive distortions associated with emotional eating. A handout for cognitive distortions was provided, which also included different things for [name] to look out for to help identify whether or not she is experiencing the distortions. Session concluded with a discussion related to the frequency of appointments as [name] appears to be doing well as evidenced by her self-report and decrease in scores on the PHQ-9 and GAD-7. Overall, [name] was receptive to today's appointment as evidenced by her openness to sharing, responsiveness to feedback, and willingness to utilize information shared with her during appointments with this provider.  Mental Status Examination: [name] arrived on time for the appointment. She presented as appropriately dressed and groomed. [name] appeared her stated age and demonstrated adequate orientation to time, place, person, and purpose of the appointment. She also demonstrated appropriate eye contact. No psychomotor abnormalities or behavioral peculiarities noted. Her mood was euthymic with congruent affect. Her thought processes were logical, linear, and goal-directed. No hallucinations, delusions, bizarre thinking or behavior reported or observed. Judgment, insight, and impulse control appeared to be grossly intact. There was no evidence of paraphasias (i.e., errors in speech, gross mispronunciations, and word substitutions), repetition deficits, or disturbances in volume or prosody (i.e., rhythm and intonation). There was no evidence of attention or memory impairments.  [name] denied current suicidal and homicidal ideation, intent or plan.  Structured Assessment Results: The Patient Health Questionnaire-9 (PHQ-9) is a self-report measure that assesses symptoms and severity of depression over the course of the last two weeks. [name] obtained a score of one suggesting minimal depression. [name] finds the endorsed symptoms to be not difficult at all. Depression screen [facility] 12/04/2017  Decreased Interest 0  Down, Depressed, Hopeless 0  PHQ - 2 Score 0  Altered sleeping 0  Tired, decreased energy 0  Change in appetite 0  Feeling bad or failure about yourself  0  Trouble concentrating 1  Moving slowly or fidgety/restless 0  Suicidal thoughts 0  PHQ-9 Score 1  Difficult doing work/chores -   The Generalized Anxiety Disorder-7 (GAD-7) is a brief self-report measure that assesses symptoms of anxiety over the course of the last two weeks. [name] obtained a score of two suggesting minimal anxiety. GAD 7 : Generalized Anxiety Score 12/04/2017  Nervous, Anxious, on Edge 0  Control/stop worrying 1  Worry too much - different things 1  Trouble relaxing 0  Restless 0  Easily annoyed or irritable 0  Afraid - awful might happen 0  Total GAD 7 Score 2  Anxiety Difficulty Not difficult at all   Interventions: [name] was administered the PHQ-9 and GAD-7 for symptom monitoring. Content from the last session was reviewed. Throughout today's session, empathic reflections and validation were provided. Psychoeducation regarding pleasurable activities; the connection between thoughts, feelings, and behaviors; and common cognitive distortions associated with emotional eating was provided.   DSM-5 Diagnosis: 296.31 (F33.0) Major Depressive Disorder, Recurrent Episode, Mild, With Anxious Distress, Mild  Treatment Goal & Progress: [name] was seen for an initial appointment with this provider on October 24, 2017 during which the following treatment goal was  established: decrease emotional eating. [name] has demonstrated progress in her goal of decreasing emotional eating as evidenced by increased awareness  of hunger patterns in her triggers for emotional eating.  Since the last appointment with this provider, Neeva indicated she has not engaged in emotional eating and there has been a reduction in cravings.  Plan: Angelyse continues to appear able and willing to participate as evidenced by engagement in reciprocal conversation, and asking questions for clarification as appropriate. The next appointment will be scheduled in one month. The next session will focus on reviewing learned skills, and the introduction of thought defusion.

## 2017-12-17 ENCOUNTER — Other Ambulatory Visit (INDEPENDENT_AMBULATORY_CARE_PROVIDER_SITE_OTHER): Payer: Self-pay | Admitting: Family Medicine

## 2017-12-17 DIAGNOSIS — E559 Vitamin D deficiency, unspecified: Secondary | ICD-10-CM

## 2017-12-17 DIAGNOSIS — R7303 Prediabetes: Secondary | ICD-10-CM

## 2017-12-23 ENCOUNTER — Ambulatory Visit (INDEPENDENT_AMBULATORY_CARE_PROVIDER_SITE_OTHER): Payer: BLUE CROSS/BLUE SHIELD | Admitting: Physician Assistant

## 2017-12-23 VITALS — BP 111/66 | HR 69 | Temp 97.8°F | Ht 63.0 in | Wt 247.0 lb

## 2017-12-23 DIAGNOSIS — Z6841 Body Mass Index (BMI) 40.0 and over, adult: Secondary | ICD-10-CM

## 2017-12-23 DIAGNOSIS — Z9189 Other specified personal risk factors, not elsewhere classified: Secondary | ICD-10-CM

## 2017-12-23 DIAGNOSIS — E559 Vitamin D deficiency, unspecified: Secondary | ICD-10-CM | POA: Diagnosis not present

## 2017-12-23 DIAGNOSIS — E8881 Metabolic syndrome: Secondary | ICD-10-CM | POA: Diagnosis not present

## 2017-12-23 DIAGNOSIS — E88819 Insulin resistance, unspecified: Secondary | ICD-10-CM

## 2017-12-23 DIAGNOSIS — E66813 Obesity, class 3: Secondary | ICD-10-CM

## 2017-12-23 MED ORDER — METFORMIN HCL 500 MG PO TABS: 500.0000 mg | ORAL_TABLET | Freq: Every day | ORAL | 0 refills | Status: DC

## 2017-12-23 MED ORDER — VITAMIN D (ERGOCALCIFEROL) 1.25 MG (50000 UNIT) PO CAPS: 50000.0000 [IU] | ORAL_CAPSULE | ORAL | 0 refills | Status: DC

## 2017-12-24 ENCOUNTER — Telehealth (INDEPENDENT_AMBULATORY_CARE_PROVIDER_SITE_OTHER): Payer: Self-pay | Admitting: Physician Assistant

## 2017-12-25 ENCOUNTER — Other Ambulatory Visit (INDEPENDENT_AMBULATORY_CARE_PROVIDER_SITE_OTHER): Payer: Self-pay | Admitting: Physician Assistant

## 2017-12-25 DIAGNOSIS — E8881 Metabolic syndrome: Secondary | ICD-10-CM

## 2017-12-25 DIAGNOSIS — E559 Vitamin D deficiency, unspecified: Secondary | ICD-10-CM

## 2017-12-25 NOTE — Progress Notes (Signed)
Office: [phone]  /  Fax: [phone]   HPI:   Chief Complaint: OBESITY [name] is here to discuss her progress with her obesity treatment plan. She is on the keep a food journal with 400-600 calories and 40 grams of protein at supper daily and follow the Category 3 plan and is following her eating plan approximately 80 % of the time. She states she is exercising 0 minutes 0 times per week. [name] did very well with weight loss. She reports being bored with lunch. She is enjoying journaling dinner, but she is not yet ready to journal all day.  Her weight is 247 lb (112 kg) today and has had a weight loss of 6 pounds over a period of 3 weeks since her last visit. She has lost 19 lbs since starting treatment with us.  Insulin Resistance [name] has a diagnosis of insulin resistance based on her elevated fasting insulin level >5. Although Alvin's blood glucose readings are still under good control, insulin resistance puts her at greater risk of metabolic syndrome and diabetes. She denies nausea, vomiting, or diarrhea on metformin and continues to work on diet and exercise to decrease risk of diabetes. She denies hypoglycemia.  Vitamin D Deficiency [name] has a diagnosis of vitamin D deficiency. She is currently taking prescription Vit D and denies nausea, vomiting or muscle weakness.  At risk for osteopenia and osteoporosis [name] is at higher risk of osteopenia and osteoporosis due to vitamin D deficiency.   ALLERGIES: Allergies  Allergen Reactions  . Ciprofloxacin Itching  . Rocephin [Ceftriaxone] Itching  . Penicillins Itching and Rash    Has patient had a PCN reaction causing immediate rash, facial/tongue/throat swelling, SOB or lightheadedness with hypotension: Unknown Has patient had a PCN reaction causing severe rash involving mucus membranes or skin necrosis: Unknown Has patient had a PCN reaction that required hospitalization Unknown Has patient had a PCN  reaction occurring within the last 10 years: Yes If all of the above answers are "NO", then may proceed with Cephalosporin use.     MEDICATIONS: Current Outpatient Medications on File Prior to Visit  Medication Sig Dispense Refill  . aspirin-acetaminophen-caffeine (EXCEDRIN MIGRAINE) 250-250-65 MG tablet Take 1 tablet by mouth every 6 (six) hours as needed for headache.    . calcium carbonate (TUMS - DOSED IN MG ELEMENTAL CALCIUM) 500 MG chewable tablet Chew 1 tablet by mouth daily as needed for indigestion or heartburn.    . meloxicam (MOBIC) 15 MG tablet Take 1 tablet (15 mg total) by mouth daily as needed for pain. 30 tablet 1  . Norgestimate-Ethinyl Estradiol Triphasic 0.18/0.215/0.25 MG-35 MCG tablet Take 1 tablet by mouth daily.    Marland Kitchen. omeprazole (PRILOSEC) 20 MG capsule Take 20 mg by mouth daily as needed.     No current facility-administered medications on file prior to visit.     PAST MEDICAL HISTORY: Past Medical History:  Diagnosis Date  . Anxiety   . Chronic kidney disease    kidney infection 2008 ish  . Constipation   . Food allergy    onions itching  . GERD (gastroesophageal reflux disease)   . Hyperlipidemia   . Migraines     PAST SURGICAL HISTORY: Past Surgical History:  Procedure Laterality Date  . CERVICAL CONE BIOPSY    . TONSILLECTOMY      SOCIAL HISTORY: Social History   Tobacco Use  . Smoking status: Never Smoker  . Smokeless tobacco: Never Used  . Tobacco comment: smoked for 1 yr  socially in college  Substance Use Topics  . Alcohol use: Yes    Comment: social   . Drug use: Yes    FAMILY HISTORY: Family History  Problem Relation Age of Onset  . Heart disease Mother        chf  . Obesity Mother   . Anxiety disorder Mother   . Skin cancer Father   . Cancer Father        skin cancer    ROS: Review of Systems  Constitutional: Positive for weight loss.  Gastrointestinal: Negative for diarrhea, nausea and vomiting.  Musculoskeletal:        Negative muscle weakness  Endo/Heme/Allergies:       Negative hypoglycemia    PHYSICAL EXAM: Blood pressure 111/66, pulse 69, temperature 97.8 F (36.6 C), temperature source Oral, height 5\' 3"  (1.6 m), weight 247 lb (112 kg), SpO2 100 %. Body mass index is 43.75 kg/m. Physical Exam  Constitutional: She is oriented to person, place, and time. She appears well-developed and well-nourished.  Cardiovascular: Normal rate.  Pulmonary/Chest: Effort normal.  Musculoskeletal: Normal range of motion.  Neurological: She is oriented to person, place, and time.  Skin: Skin is warm and dry.  Psychiatric: She has a normal mood and affect. Her behavior is normal.  Vitals reviewed.   RECENT LABS AND TESTS: BMET    Component Value Date/Time   NA 136 10/11/2017 0849   K 5.1 10/11/2017 0849   CL 98 10/11/2017 0849   CO2 23 10/11/2017 0849   GLUCOSE 87 10/11/2017 0849   GLUCOSE 106 (H) 04/06/2015 1136   BUN 12 10/11/2017 0849   CREATININE 0.79 10/11/2017 0849   CALCIUM 9.2 10/11/2017 0849   GFRNONAA 98 10/11/2017 0849   GFRAA 113 10/11/2017 0849   Lab Results  Component Value Date   HGBA1C 5.4 10/11/2017   Lab Results  Component Value Date   INSULIN 10.3 10/11/2017   CBC    Component Value Date/Time   WBC 6.6 10/11/2017 0849   WBC 10.5 04/06/2015 1136   RBC 4.85 10/11/2017 0849   RBC 5.07 04/06/2015 1136   HGB 14.1 10/11/2017 0849   HCT 42.4 10/11/2017 0849   PLT 285 04/06/2015 1136   MCV 87 10/11/2017 0849   MCH 29.1 10/11/2017 0849   MCH 29.4 04/06/2015 1136   MCHC 33.3 10/11/2017 0849   MCHC 32.4 04/06/2015 1136   RDW 13.1 10/11/2017 0849   LYMPHSABS 1.7 10/11/2017 0849   MONOABS 1.1 (H) 02/28/2007 0247   EOSABS 0.0 10/11/2017 0849   BASOSABS 0.0 10/11/2017 0849   Iron/TIBC/Ferritin/ %Sat No results found for: IRON, TIBC, FERRITIN, IRONPCTSAT Lipid Panel     Component Value Date/Time   CHOL 225 (H) 10/11/2017 0849   TRIG 109 10/11/2017 0849   HDL 70  10/11/2017 0849   LDLCALC 133 (H) 10/11/2017 0849   Hepatic Function Panel     Component Value Date/Time   PROT 7.1 10/11/2017 0849   ALBUMIN 3.9 10/11/2017 0849   AST 14 10/11/2017 0849   ALT 16 10/11/2017 0849   ALKPHOS 136 (H) 10/11/2017 0849   BILITOT 0.4 10/11/2017 0849      Component Value Date/Time   TSH 2.950 10/11/2017 0849  Results for NILAYA, BOUIE (MRN [id number]) as of 12/25/2017 14:45  Ref. Range 10/11/2017 08:49  Vitamin D, 25-Hydroxy Latest Ref Range: 30.0 - 100.0 ng/mL 29.2 (L)    ASSESSMENT AND PLAN: Insulin resistance - Plan: metFORMIN (GLUCOPHAGE) 500 MG tablet  Vitamin D  deficiency - Plan: Vitamin D, Ergocalciferol, (DRISDOL) 1.25 MG (50000 UT) CAPS capsule  At risk for osteoporosis  Class 3 severe obesity with serious comorbidity and body mass index (BMI) of 40.0 to 44.9 in adult, unspecified obesity type (HCC)  PLAN:  Insulin Resistance Nautia will continue to work on weight loss, diet, exercise, and decreasing simple carbohydrates in her diet to help decrease the risk of diabetes. We dicussed metformin including benefits and risks. She was informed that eating too many simple carbohydrates or too many calories at one sitting increases the likelihood of GI side effects. Carissa agrees to continue taking metformin 500 mg q PM, take at lunch #30 and we will refill for 1 month. Stephene agrees to follow up with our clinic in 3 weeks as directed to monitor her progress.  Vitamin D Deficiency Jhoana was informed that low vitamin D levels contributes to fatigue and are associated with obesity, breast, and colon cancer. Yisell agrees to continue taking prescription Vit D @50 ,000 IU every week #4 and we will refill for 1 month. She will follow up for routine testing of vitamin D, at least 2-3 times per year. She was informed of the risk of over-replacement of vitamin D and agrees to not increase her dose unless she discusses this with [place] first. Jannice  agrees to follow up with our clinic in 3 weeks.  At risk for osteopenia and osteoporosis Munira was given extended (15 minutes) osteoporosis prevention counseling today. Shontay is at risk for osteopenia and osteoporsis due to her vitamin D deficiency. She was encouraged to take her vitamin D and follow her higher calcium diet and increase strengthening exercise to help strengthen her bones and decrease her risk of osteopenia and osteoporosis.  Obesity Abegail is currently in the action stage of change. As such, her goal is to continue with weight loss efforts She has agreed to follow the Category 3 plan Safiya has been instructed to work up to a goal of 150 minutes of combined cardio and strengthening exercise per week for weight loss and overall health benefits. We discussed the following Behavioral Modification Strategies today: work on meal planning and easy cooking plans and ways to avoid boredom eating   Jaira has agreed to follow up with our clinic in 3 weeks. She was informed of the importance of frequent follow up visits to maximize her success with intensive lifestyle modifications for her multiple health conditions.   OBESITY BEHAVIORAL INTERVENTION VISIT  Today's visit was # 5   Starting weight: 266 lbs Starting date: 10/10/17 Today's weight : 247 lbs  Today's date: 12/23/2017 Total lbs lost to date: 27    ASK: We discussed the diagnosis of obesity with Elisha Ponder today and Leandrea agreed to give [place] permission to discuss obesity behavioral modification therapy today.  ASSESS: Georgean has the diagnosis of obesity and her BMI today is 43.76 Davis is in the action stage of change   ADVISE: Ryan was educated on the multiple health risks of obesity as well as the benefit of weight loss to improve her health. She was advised of the need for long term treatment and the importance of lifestyle modifications.  AGREE: Multiple dietary modification  options and treatment options were discussed and  Vallery agreed to the above obesity treatment plan.  Trude Mcburney, am acting as transcriptionist for Alois Cliche, PA-C I, Alois Cliche, PA-C have reviewed above note and agree with its content

## 2018-01-01 ENCOUNTER — Ambulatory Visit (INDEPENDENT_AMBULATORY_CARE_PROVIDER_SITE_OTHER): Payer: Self-pay | Admitting: Psychology

## 2018-01-23 ENCOUNTER — Ambulatory Visit (INDEPENDENT_AMBULATORY_CARE_PROVIDER_SITE_OTHER): Payer: BLUE CROSS/BLUE SHIELD | Admitting: Physician Assistant

## 2018-01-23 ENCOUNTER — Encounter (INDEPENDENT_AMBULATORY_CARE_PROVIDER_SITE_OTHER): Payer: Self-pay | Admitting: Physician Assistant

## 2018-01-23 VITALS — BP 111/78 | HR 82 | Temp 97.8°F | Ht 63.0 in | Wt 245.0 lb

## 2018-01-23 DIAGNOSIS — Z6841 Body Mass Index (BMI) 40.0 and over, adult: Secondary | ICD-10-CM

## 2018-01-23 DIAGNOSIS — Z9189 Other specified personal risk factors, not elsewhere classified: Secondary | ICD-10-CM | POA: Diagnosis not present

## 2018-01-23 DIAGNOSIS — E8881 Metabolic syndrome: Secondary | ICD-10-CM

## 2018-01-23 DIAGNOSIS — E7849 Other hyperlipidemia: Secondary | ICD-10-CM

## 2018-01-23 DIAGNOSIS — E559 Vitamin D deficiency, unspecified: Secondary | ICD-10-CM | POA: Diagnosis not present

## 2018-01-23 MED ORDER — METFORMIN HCL 500 MG PO TABS: 500.0000 mg | ORAL_TABLET | Freq: Every day | ORAL | 0 refills | Status: DC

## 2018-01-23 MED ORDER — VITAMIN D (ERGOCALCIFEROL) 1.25 MG (50000 UNIT) PO CAPS: 50000.0000 [IU] | ORAL_CAPSULE | ORAL | 0 refills | Status: DC

## 2018-01-23 NOTE — Progress Notes (Signed)
Office: [phone]  /  Fax: [phone]   HPI:   Chief Complaint: OBESITY Selena Flores is here to discuss her progress with her obesity treatment plan. She is on the Category 3 plan and is following her eating plan approximately 50 % of the time. She states she is exercising 0 minutes 0 times per week. Selena Flores did well with weight loss. She reports that she is now ready to journal all day.  Her weight is 245 lb (111.1 kg) today and has had a weight loss of 5 pounds over a period of 4 weeks since her last visit. She has lost 21 lbs since starting treatment with [place].  Insulin Resistance Selena Flores has a diagnosis of insulin resistance based on her elevated fasting insulin level >5. Although Selena Flores's blood glucose readings are still under good control, insulin resistance puts her at greater risk of metabolic syndrome and diabetes. She is on metformin and denies nausea, vomiting, or diarrhea. She continues to work on diet and exercise to decrease risk of diabetes. She denies polyphagia.  Vitamin D Deficiency Selena Flores has a diagnosis of vitamin D deficiency. She is currently taking prescription Vit D and denies nausea, vomiting or muscle weakness.  At risk for osteopenia and osteoporosis Selena Flores is at higher risk of osteopenia and osteoporosis due to vitamin D deficiency.   Hyperlipidemia Selena Flores has hyperlipidemia and has been trying to improve her cholesterol levels with intensive lifestyle modification including a low saturated fat diet, exercise and weight loss. She is not on any medications and denies any chest pain, claudication or myalgias.  ALLERGIES: Allergies  Allergen Reactions  . Ciprofloxacin Itching  . Rocephin [Ceftriaxone] Itching  . Penicillins Itching and Rash    Has patient had a PCN reaction causing immediate rash, facial/tongue/throat swelling, SOB or lightheadedness with hypotension: Unknown Has patient had a PCN reaction causing severe rash involving mucus  membranes or skin necrosis: Unknown Has patient had a PCN reaction that required hospitalization Unknown Has patient had a PCN reaction occurring within the last 10 years: Yes If all of the above answers are "NO", then may proceed with Cephalosporin use.     MEDICATIONS: Current Outpatient Medications on File Prior to Visit  Medication Sig Dispense Refill  . aspirin-acetaminophen-caffeine (EXCEDRIN MIGRAINE) 250-250-65 MG tablet Take 1 tablet by mouth every 6 (six) hours as needed for headache.    . calcium carbonate (TUMS - DOSED IN MG ELEMENTAL CALCIUM) 500 MG chewable tablet Chew 1 tablet by mouth daily as needed for indigestion or heartburn.    . meloxicam (MOBIC) 15 MG tablet Take 1 tablet (15 mg total) by mouth daily as needed for pain. 30 tablet 1  . Norgestimate-Ethinyl Estradiol Triphasic 0.18/0.215/0.25 MG-35 MCG tablet Take 1 tablet by mouth daily.    Marland Kitchen omeprazole (PRILOSEC) 20 MG capsule Take 20 mg by mouth daily as needed.     No current facility-administered medications on file prior to visit.     PAST MEDICAL HISTORY: Past Medical History:  Diagnosis Date  . Anxiety   . Chronic kidney disease    kidney infection 2008 ish  . Constipation   . Food allergy    onions itching  . GERD (gastroesophageal reflux disease)   . Hyperlipidemia   . Migraines     PAST SURGICAL HISTORY: Past Surgical History:  Procedure Laterality Date  . CERVICAL CONE BIOPSY    . TONSILLECTOMY      SOCIAL HISTORY: Social History   Tobacco Use  . Smoking status: Never  Smoker  . Smokeless tobacco: Never Used  . Tobacco comment: smoked for 1 yr socially in college  Substance Use Topics  . Alcohol use: Yes    Comment: social   . Drug use: Yes    FAMILY HISTORY: Family History  Problem Relation Age of Onset  . Heart disease Mother        chf  . Obesity Mother   . Anxiety disorder Mother   . Skin cancer Father   . Cancer Father        skin cancer    ROS: Review of Systems    Constitutional: Positive for weight loss.  Cardiovascular: Negative for chest pain and claudication.  Gastrointestinal: Negative for diarrhea, nausea and vomiting.  Musculoskeletal: Negative for myalgias.       Negative muscle weakness  Endo/Heme/Allergies:       Negative polyphagia    PHYSICAL EXAM: Blood pressure 111/78, pulse 82, temperature 97.8 F (36.6 C), temperature source Oral, height 5\' 3"  (1.6 m), weight 245 lb (111.1 kg), SpO2 98 %. Body mass index is 43.4 kg/m. Physical Exam Vitals signs reviewed.  Constitutional:      Appearance: Normal appearance. She is obese.  Cardiovascular:     Rate and Rhythm: Normal rate.     Pulses: Normal pulses.  Pulmonary:     Effort: Pulmonary effort is normal.  Musculoskeletal: Normal range of motion.  Skin:    General: Skin is warm and dry.  Neurological:     Mental Status: She is alert and oriented to person, place, and time.  Psychiatric:        Mood and Affect: Mood normal.        Behavior: Behavior normal.     RECENT LABS AND TESTS: BMET    Component Value Date/Time   NA 136 10/11/2017 0849   K 5.1 10/11/2017 0849   CL 98 10/11/2017 0849   CO2 23 10/11/2017 0849   GLUCOSE 87 10/11/2017 0849   GLUCOSE 106 (H) 04/06/2015 1136   BUN 12 10/11/2017 0849   CREATININE 0.79 10/11/2017 0849   CALCIUM 9.2 10/11/2017 0849   GFRNONAA 98 10/11/2017 0849   GFRAA 113 10/11/2017 0849   Lab Results  Component Value Date   HGBA1C 5.4 10/11/2017   Lab Results  Component Value Date   INSULIN 10.3 10/11/2017   CBC    Component Value Date/Time   WBC 6.6 10/11/2017 0849   WBC 10.5 04/06/2015 1136   RBC 4.85 10/11/2017 0849   RBC 5.07 04/06/2015 1136   HGB 14.1 10/11/2017 0849   HCT 42.4 10/11/2017 0849   PLT 285 04/06/2015 1136   MCV 87 10/11/2017 0849   MCH 29.1 10/11/2017 0849   MCH 29.4 04/06/2015 1136   MCHC 33.3 10/11/2017 0849   MCHC 32.4 04/06/2015 1136   RDW 13.1 10/11/2017 0849   LYMPHSABS 1.7 10/11/2017  0849   MONOABS 1.1 (H) 02/28/2007 0247   EOSABS 0.0 10/11/2017 0849   BASOSABS 0.0 10/11/2017 0849   Iron/TIBC/Ferritin/ %Sat No results found for: IRON, TIBC, FERRITIN, IRONPCTSAT Lipid Panel     Component Value Date/Time   CHOL 225 (H) 10/11/2017 0849   TRIG 109 10/11/2017 0849   HDL 70 10/11/2017 0849   LDLCALC 133 (H) 10/11/2017 0849   Hepatic Function Panel     Component Value Date/Time   PROT 7.1 10/11/2017 0849   ALBUMIN 3.9 10/11/2017 0849   AST 14 10/11/2017 0849   ALT 16 10/11/2017 0849   ALKPHOS 136 (  H) 10/11/2017 0849   BILITOT 0.4 10/11/2017 0849      Component Value Date/Time   TSH 2.950 10/11/2017 0849    ASSESSMENT AND PLAN: Insulin resistance - Plan: Comprehensive metabolic panel, Hemoglobin A1c, Insulin, random, metFORMIN (GLUCOPHAGE) 500 MG tablet  Vitamin D deficiency - Plan: VITAMIN D 25 Hydroxy (Vit-D Deficiency, Fractures), Vitamin D, Ergocalciferol, (DRISDOL) 1.25 MG (50000 UT) CAPS capsule  Other hyperlipidemia - Plan: Lipid Panel With LDL/HDL Ratio  At risk for osteoporosis  Class 3 severe obesity with serious comorbidity and body mass index (BMI) of 40.0 to 44.9 in adult, unspecified obesity type (HCC)  PLAN:  Insulin Resistance Selena Flores will continue to work on weight loss, exercise, and decreasing simple carbohydrates in her diet to help decrease the risk of diabetes. We dicussed metformin including benefits and risks. She was informed that eating too many simple carbohydrates or too many calories at one sitting increases the likelihood of GI side effects. Selena Flores agrees to continue taking metformin 500 mg q AM #30 and we will refill for 1 month. We will check labs today. Selena Flores agrees to follow up with our clinic in 3 weeks as directed to monitor her progress.  Vitamin D Deficiency Selena Flores was informed that low vitamin D levels contributes to fatigue and are associated with obesity, breast, and colon cancer. Selena Flores agrees to continue  taking prescription Vit D @50 ,000 IU every week #4 and we will refill for 1 month. She will follow up for routine testing of vitamin D, at least 2-3 times per year. She was informed of the risk of over-replacement of vitamin D and agrees to not increase her dose unless she discusses this with us first. We will check labs today and Selena Flores agrees to follow up with our clinic in 3 weeks.  At risk for osteopenia and osteoporosis Selena Flores was given extended (15 minutes) osteoporosis prevention counseling today. Selena Flores is at risk for osteopenia and osteoporsis due to her vitamin D deficiency. She was encouraged to take her vitamin D and follow her higher calcium diet and increase strengthening exercise to help strengthen her bones and decrease her risk of osteopenia and osteoporosis.  Hyperlipidemia Selena Flores was informed of the American Heart Association Guidelines emphasizing intensive lifestyle modifications as the first line treatment for hyperlipidemia. We discussed many lifestyle modifications today in depth, and Selena Flores will continue to work on decreasing saturated fats such as fatty red meat, butter and many fried foods. She will also increase vegetables and lean protein in her diet and continue to work on exercise and weight loss efforts. We will check labs today and Selena Flores agrees to follow up with our clinic in 3 weeks.   Obesity Selena Flores is currently in the action stage of change. As such, her goal is to continue with weight loss efforts She has agreed to change to keep a food journal with 1500 calories and 90 grams of protein daily Selena Flores has been instructed to work up to a goal of 150 minutes of combined cardio and strengthening exercise per week for weight loss and overall health benefits. We discussed the following Behavioral Modification Strategies today: work on meal planning and easy cooking plans and ways to avoid boredom eating   Selena Flores has agreed to follow up with our clinic  in 3 weeks. She was informed of the importance of frequent follow up visits to maximize her success with intensive lifestyle modifications for her multiple health conditions.   OBESITY BEHAVIORAL INTERVENTION VISIT  Today's visit was # 6  Starting weight: 266 lbs Starting date: 10/10/17 Today's weight : 245 lbs  Today's date: 01/23/2018 Total lbs lost to date: 21    ASK: We discussed the diagnosis of obesity with [name] Lynn Wooten today and Selena Flores agreed to give us permission to discuss obesity behavioral modification therapy today.  ASSESS: Selena Flores has the diagnosis of obesity and her BMI today is 43.41 Selena Flores is in the action stage of change   ADVISE: Selena Flores was educated on the multiple health risks of obesity as well as the benefit of weight loss to improve her health. She was advised of the need for long term treatment and the importance of lifestyle modifications.  AGREE: Multiple dietary modification options and treatment options were discussed and  Selena Flores agreed to the above obesity treatment plan.  Trude McburneyI, Sharon Martin, am acting as transcriptionist for Alois Clicheracey Areeba Sulser, PA-C I, Alois Clicheracey Livier Hendel, PA-C have reviewed above note and agree with its content

## 2018-01-24 LAB — COMPREHENSIVE METABOLIC PANEL
ALT: 27 IU/L (ref 0–32)
AST: 21 IU/L (ref 0–40)
Albumin/Globulin Ratio: 1.2 (ref 1.2–2.2)
Albumin: 3.8 g/dL (ref 3.5–5.5)
Alkaline Phosphatase: 128 IU/L — ABNORMAL HIGH (ref 39–117)
BUN/Creatinine Ratio: 16 (ref 9–23)
BUN: 14 mg/dL (ref 6–20)
Bilirubin Total: 0.2 mg/dL (ref 0.0–1.2)
CO2: 22 mmol/L (ref 20–29)
Calcium: 9.3 mg/dL (ref 8.7–10.2)
Chloride: 102 mmol/L (ref 96–106)
Creatinine, Ser: 0.87 mg/dL (ref 0.57–1.00)
GFR calc Af Amer: 100 mL/min/{1.73_m2} (ref >59)
GFR calc non Af Amer: 87 mL/min/{1.73_m2} (ref >59)
Globulin, Total: 3.2 g/dL (ref 1.5–4.5)
Glucose: 89 mg/dL (ref 65–99)
Potassium: 4.8 mmol/L (ref 3.5–5.2)
Sodium: 139 mmol/L (ref 134–144)
Total Protein: 7 g/dL (ref 6.0–8.5)

## 2018-01-24 LAB — LIPID PANEL WITH LDL/HDL RATIO
Cholesterol, Total: 250 mg/dL — ABNORMAL HIGH (ref 100–199)
HDL: 72 mg/dL (ref >39)
LDL Calculated: 158 mg/dL — ABNORMAL HIGH (ref 0–99)
LDl/HDL Ratio: 2.2 ratio (ref 0.0–3.2)
Triglycerides: 101 mg/dL (ref 0–149)
VLDL Cholesterol Cal: 20 mg/dL (ref 5–40)

## 2018-01-24 LAB — VITAMIN D 25 HYDROXY (VIT D DEFICIENCY, FRACTURES): Vit D, 25-Hydroxy: 35.9 ng/mL (ref 30.0–100.0)

## 2018-01-24 LAB — INSULIN, RANDOM: INSULIN: 7.4 u[IU]/mL (ref 2.6–24.9)

## 2018-01-24 LAB — HEMOGLOBIN A1C
Est. average glucose Bld gHb Est-mCnc: 108 mg/dL
Hgb A1c MFr Bld: 5.4 % (ref 4.8–5.6)

## 2018-01-30 NOTE — Progress Notes (Signed)
Office: [phone]  /  Fax: [phone]    Date: February 03, 2018   Time Seen: 4:35pm Duration: 23 minutes Provider: Lawerance Cruel, Psy.D. Type of Session: Individual Therapy  Type of Contact: Face-to-face  HPI: [name] referred by Dr. Mathews Argyle seen for an initial appointment by this provider on October 24, 2017. Per the note for the initial visit withDr. Darrel Hoover October 10, 2017,"Michellehad a positive depression screening with significant emotional eating and unhealthy relationship with food. She also shows signs of body dysmorphia."In addition, per the note for the initial visit withDr. Darrel Hoover October 10, 2017,Michellehas been heavy most of her lifeandshe started gaining weight after the age of[age] or so. Her heaviest weight ever was 285 pounds. During the initial appointment with Dr. Dalbert Garnet, Marcelino Duster reported experiencing the following:significant food craving issues; frequently drinking liquids with calories; frequently making poor food choices; and struggling with emotional eating. She also described herself as a picky eater and does not like to eat healthier foods.She duringthe initial appointment with this provider,Lajada stated, "My regulardoctorreferred to me to Dr. Renae Gloss in April." She explained she previously lost weight, but could not keep it off. Regarding the appointment with this provider, Roi reported she hopes to talk about her eating habits related to her mood. As it relates to emotional eating, Nataisha explained she overeats when celebrating, and noted, "I'm more likely to over drink when celebrating." She also described eating when experiencing unpleasant emotions for comfort. Brynnley explained she does not often over eat, rather makes choices that are not healthy. 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 Moreover, Sayoko explained a history of anxiety and she recently discontinued  Zoloft.Currently, she and her primary care physician are in a "debate" to determine if she should be prescribed a psychotropic medication again. Since starting with the clinic, Eleanna noted if she experiences emotional hunger, she will eat foods on her meal plan.Aadhira denied a history of binge eating. She denied a history of purging and engagement in other compensatory strategies. Bradee indicated she has never been diagnosed with an eating disorder.Additionally,Yennifer discussed a history of eating fast food starting as a child; therefore, she has gotten used to "eating out all the time."Furthermore,[name] asked to complete a questionnaire assessing various behaviors related to emotional eating. Michelleendorsed the following: overeat when you are celebrating, experience food cravings on a regular basis, eat certain foods when you are anxious, stressed, depressed, or your feelings are hurt, find food is comforting to you, overeat when you are angry or upset, overeat frequently when you are bored or lonely, not worry about what you eat when you are in a good mood, eat to help you stay awake and eat as a reward.  During today's appointment, Elza reported losing five pounds following the holidays.   Session Content: Session focused on the following treatment goal: decrease emotional eating and termination. The session was initiated with the administration of the PHQ-9 and GAD-7, as well as a brief check-in. Eniya shared about recent events, including a desire to initiate longer-term therapeutic services as there is an increase in anxiety. The option of a referral longer-term services was discussed, and Marlasia reported she would like to wait to meet with her PCP and request a referral during that appointment. She also noted this would be the last appointment with this provider. Regarding eating, Latawnya stated, "With the holidays, it was not great." However, she indicated losing five  pounds at her last weigh in during her  appointment on January 23, 2018 with Alois Cliche, PA-C. Per Naydelin's request, today focused on coping with anxiety due to the increase as well as it being a potential trigger for emotional eating. Psychoeducation regarding thought defusion, including its impact on emotional eating was provided. Tiffinay was led through a thought defusion exercise, and a handout with various exercises was provided. Annorah was encouraged to engage in the thought defusion exercises between now and the next appointment with this provider. Marcelino Duster agreed. She was led through an exercise for which she used the thought, "I am careless." Following the exercise, Tawanna noted it was initially "upsetting," but noted it did not feel as heavy as the exercise progressed. Anyra was led through an additional thought defusion exercise for which she used the thought, "I'm a failure." Julliette was observed laughing as the exercise progressed. Ellee was receptive to today's session as evidenced by openness to sharing, responsiveness to feedback, and engagement in the thought defusion exercises. Session concluded with Marcelino Duster stating, "I always feel better after I come here."   Mental Status Examination: Terrence arrived on time for the appointment. She presented as appropriately dressed and groomed. Kaylnn appeared her stated age and demonstrated adequate orientation to time, place, person, and purpose of the appointment. She also demonstrated appropriate eye contact. No psychomotor abnormalities or behavioral peculiarities noted. Her mood was euthymic with congruent affect. Her thought processes were logical, linear, and goal-directed. No hallucinations, delusions, bizarre thinking or behavior reported or observed. Judgment, insight, and impulse control appeared to be grossly intact. There was no evidence of paraphasias (i.e., errors in speech, gross mispronunciations, and word substitutions),  repetition deficits, or disturbances in volume or prosody (i.e., rhythm and intonation). There was no evidence of attention or memory impairments. Nikyah denied current suicidal and homicidal ideation, intent or plan.  Structured Assessment Results: The Patient Health Questionnaire-9 (PHQ-9) is a self-report measure that assesses symptoms and severity of depression over the course of the last two weeks. Aubreelynn obtained a score of 3 suggesting minimal depression. Katilin finds the endorsed symptoms to be somewhat difficult. Depression screen [facility] 2/9 02/03/2018  Decreased Interest 0  Down, Depressed, Hopeless 1  PHQ - 2 Score 1  Altered sleeping 1  Tired, decreased energy 1  Change in appetite 0  Feeling bad or failure about yourself  0  Trouble concentrating 0  Moving slowly or fidgety/restless 0  Suicidal thoughts 0  PHQ-9 Score 3  Difficult doing work/chores -   The Generalized Anxiety Disorder-7 (GAD-7) is a brief self-report measure that assesses symptoms of anxiety over the course of the last two weeks. Brehanna obtained a score of 5 suggesting mild anxiety. 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 Eleora has demonstrated progress in her goal as evidenced by increased awareness of  hunger patterns in her triggers for emotional eating. During today's appointment she discussed weight loss during the holidays, and demonstrated willingness to engage in learned skills.   Plan: [name] reported a plan to initiate longer-term therapeutic services due to an increase in anxiety symptoms. She indicated she will be meeting with her PCP on February 25, 2018 to discuss options; therefore, declined a referral from this provider at this time. She acknowledged understanding that if she wishes to re-initiate therapeutic services with this provider, she can do so by calling the office.

## 2018-02-03 ENCOUNTER — Ambulatory Visit (INDEPENDENT_AMBULATORY_CARE_PROVIDER_SITE_OTHER): Payer: BLUE CROSS/BLUE SHIELD | Admitting: Psychology

## 2018-02-03 DIAGNOSIS — F330 Major depressive disorder, recurrent, mild: Secondary | ICD-10-CM

## 2018-02-04 ENCOUNTER — Encounter: Payer: Self-pay | Admitting: Family Medicine

## 2018-02-17 ENCOUNTER — Encounter (INDEPENDENT_AMBULATORY_CARE_PROVIDER_SITE_OTHER): Payer: Self-pay | Admitting: Family Medicine

## 2018-02-17 ENCOUNTER — Other Ambulatory Visit (INDEPENDENT_AMBULATORY_CARE_PROVIDER_SITE_OTHER): Payer: Self-pay | Admitting: Physician Assistant

## 2018-02-17 ENCOUNTER — Ambulatory Visit (INDEPENDENT_AMBULATORY_CARE_PROVIDER_SITE_OTHER): Payer: BLUE CROSS/BLUE SHIELD | Admitting: Family Medicine

## 2018-02-17 VITALS — Temp 97.5°F | Ht 63.0 in | Wt 237.0 lb

## 2018-02-17 DIAGNOSIS — E559 Vitamin D deficiency, unspecified: Secondary | ICD-10-CM | POA: Diagnosis not present

## 2018-02-17 DIAGNOSIS — E8881 Metabolic syndrome: Secondary | ICD-10-CM | POA: Diagnosis not present

## 2018-02-17 DIAGNOSIS — Z9189 Other specified personal risk factors, not elsewhere classified: Secondary | ICD-10-CM

## 2018-02-17 DIAGNOSIS — Z6841 Body Mass Index (BMI) 40.0 and over, adult: Secondary | ICD-10-CM

## 2018-02-17 MED ORDER — VITAMIN D (ERGOCALCIFEROL) 1.25 MG (50000 UNIT) PO CAPS: 50000.0000 [IU] | ORAL_CAPSULE | ORAL | 0 refills | Status: DC

## 2018-02-17 MED ORDER — METFORMIN HCL 500 MG PO TABS: 500.0000 mg | ORAL_TABLET | Freq: Every day | ORAL | 0 refills | Status: DC

## 2018-02-18 NOTE — Progress Notes (Signed)
Office: [phone]  /  Fax: [phone]   HPI:   Chief Complaint: OBESITY Brittnee is here to discuss her progress with her obesity treatment plan. She is keeping a food journal with 1500 calories and 90 grams of protein and is following her eating plan approximately 75 % of the time. She states she is exercising 0 minutes 0 times per week. Theresamarie continues to do well with weight loss. She is doing better with journaling and is working on increasing lean protein.  Her weight is 237 lb (107.5 kg) today and has had a weight loss of 5 pounds over a period of 2 weeks since her last visit. She has lost 29 lbs since starting treatment with [place].  Insulin Resistance Marguritte has a diagnosis of insulin resistance based on her elevated fasting insulin level >5. Although Lela's blood glucose readings are still under good control, insulin resistance puts her at greater risk of metabolic syndrome and diabetes. She is attempting to improve with diet and is taking metformin currently and continues to work on diet and exercise to decrease risk of diabetes.  Vitamin D deficiency Latrica has a diagnosis of vitamin D deficiency. She is currently stable on vit D, which is slowly improving, but not yet at goal.  At risk for cardiovascular disease Huberta is at a higher than average risk for cardiovascular disease due to diabetes and obesity. She currently denies any chest pain.  ASSESSMENT AND PLAN:  Insulin resistance - Plan: metFORMIN (GLUCOPHAGE) 500 MG tablet  Vitamin D deficiency - Plan: Vitamin D, Ergocalciferol, (DRISDOL) 1.25 MG (50000 UT) CAPS capsule  At risk for heart disease  Class 3 severe obesity with serious comorbidity and body mass index (BMI) of 40.0 to 44.9 in adult, unspecified obesity type (HCC)  PLAN:  Insulin Resistance Golden will continue to work on weight loss, exercise, and decreasing simple carbohydrates in her diet to help decrease the risk of diabetes.She was  informed that eating too many simple carbohydrates or too many calories at one sitting increases the likelihood of GI side effects. Chawn agreed to continue metformin 500mg  #30 with no refills and prescription was written today. Tzivia agreed to follow up with [place] as directed to monitor her progress in 3 weeks.  Vitamin D Deficiency Torunn was informed that low vitamin D levels contributes to fatigue and are associated with obesity, breast, and colon cancer. She agrees to continue to take prescription Vit D @50 ,000 IU every week #4 with no refills and will follow up for routine testing of vitamin D, at least 2-3 times per year. She was informed of the risk of over-replacement of vitamin D and agrees to not increase her dose unless she discusses this with [place] first. Amely agrees to follow up as directed.  Cardiovascular risk counseling Maleia was given extended (15 minutes) coronary artery disease prevention counseling today. She is 38 y.o. female and has risk factors for heart disease including diabetes and obesity. We discussed intensive lifestyle modifications today with an emphasis on specific weight loss instructions and strategies. Pt was also informed of the importance of increasing exercise and decreasing saturated fats to help prevent heart disease.  Obesity Jessicaanne is currently in the action stage of change. As such, her goal is to continue with weight loss efforts. She has agreed to keep a food journal with 1500 calories and 75 grams of protein.  Mardy has been instructed to work up to a goal of 150 minutes of combined cardio and strengthening exercise  per week for weight loss and overall health benefits. We discussed the following Behavioral Modification Strategies today: increasing lean protein intake, decreasing simple carbohydrates, and work on meal planning and easy cooking plans.  Carletha has agreed to follow up with our clinic in 3 weeks. She was informed of the  importance of frequent follow up visits to maximize her success with intensive lifestyle modifications for her multiple health conditions.  ALLERGIES: Allergies  Allergen Reactions  . Ciprofloxacin Itching  . Rocephin [Ceftriaxone] Itching  . Penicillins Itching and Rash    Has patient had a PCN reaction causing immediate rash, facial/tongue/throat swelling, SOB or lightheadedness with hypotension: Unknown Has patient had a PCN reaction causing severe rash involving mucus membranes or skin necrosis: Unknown Has patient had a PCN reaction that required hospitalization Unknown Has patient had a PCN reaction occurring within the last 10 years: Yes If all of the above answers are "NO", then may proceed with Cephalosporin use.     MEDICATIONS: Current Outpatient Medications on File Prior to Visit  Medication Sig Dispense Refill  . aspirin-acetaminophen-caffeine (EXCEDRIN MIGRAINE) 250-250-65 MG tablet Take 1 tablet by mouth every 6 (six) hours as needed for headache.    . calcium carbonate (TUMS - DOSED IN MG ELEMENTAL CALCIUM) 500 MG chewable tablet Chew 1 tablet by mouth daily as needed for indigestion or heartburn.    . meloxicam (MOBIC) 15 MG tablet Take 1 tablet (15 mg total) by mouth daily as needed for pain. 30 tablet 1  . Norgestimate-Ethinyl Estradiol Triphasic 0.18/0.215/0.25 MG-35 MCG tablet Take 1 tablet by mouth daily.    Marland Kitchen omeprazole (PRILOSEC) 20 MG capsule Take 20 mg by mouth daily as needed.    . sertraline (ZOLOFT) 50 MG tablet Take 50 mg by mouth daily.     No current facility-administered medications on file prior to visit.     PAST MEDICAL HISTORY: Past Medical History:  Diagnosis Date  . Anxiety   . Chronic kidney disease    kidney infection 2008 ish  . Constipation   . Food allergy    onions itching  . GERD (gastroesophageal reflux disease)   . Hyperlipidemia   . Migraines     PAST SURGICAL HISTORY: Past Surgical History:  Procedure Laterality Date  .  CERVICAL CONE BIOPSY    . TONSILLECTOMY      SOCIAL HISTORY: Social History   Tobacco Use  . Smoking status: Never Smoker  . Smokeless tobacco: Never Used  . Tobacco comment: smoked for 1 yr socially in college  Substance Use Topics  . Alcohol use: Yes    Comment: social   . Drug use: Yes    FAMILY HISTORY: Family History  Problem Relation Age of Onset  . Heart disease Mother        chf  . Obesity Mother   . Anxiety disorder Mother   . Skin cancer Father   . Cancer Father        skin cancer    ROS: Review of Systems  Constitutional: Positive for weight loss.  Cardiovascular: Negative for chest pain.  Gastrointestinal: Negative for nausea and vomiting.  Endo/Heme/Allergies:       Negative for hypoglycemia.    PHYSICAL EXAM: Temperature (!) 97.5 F (36.4 C), temperature source Oral, height 5\' 3"  (1.6 m), weight 237 lb (107.5 kg). Body mass index is 41.98 kg/m. Physical Exam Vitals signs reviewed.  Constitutional:      Appearance: Normal appearance. She is obese.  Cardiovascular:  Rate and Rhythm: Normal rate.  Pulmonary:     Effort: Pulmonary effort is normal.  Musculoskeletal: Normal range of motion.  Skin:    General: Skin is warm and dry.  Neurological:     Mental Status: She is alert and oriented to person, place, and time.  Psychiatric:        Mood and Affect: Mood normal.        Behavior: Behavior normal.     RECENT LABS AND TESTS: BMET    Component Value Date/Time   NA 139 01/23/2018 0815   K 4.8 01/23/2018 0815   CL 102 01/23/2018 0815   CO2 22 01/23/2018 0815   GLUCOSE 89 01/23/2018 0815   GLUCOSE 106 (H) 04/06/2015 1136   BUN 14 01/23/2018 0815   CREATININE 0.87 01/23/2018 0815   CALCIUM 9.3 01/23/2018 0815   GFRNONAA 87 01/23/2018 0815   GFRAA 100 01/23/2018 0815   Lab Results  Component Value Date   HGBA1C 5.4 01/23/2018   HGBA1C 5.4 10/11/2017   Lab Results  Component Value Date   INSULIN 7.4 01/23/2018   INSULIN 10.3  10/11/2017   CBC    Component Value Date/Time   WBC 6.6 10/11/2017 0849   WBC 10.5 04/06/2015 1136   RBC 4.85 10/11/2017 0849   RBC 5.07 04/06/2015 1136   HGB 14.1 10/11/2017 0849   HCT 42.4 10/11/2017 0849   PLT 285 04/06/2015 1136   MCV 87 10/11/2017 0849   MCH 29.1 10/11/2017 0849   MCH 29.4 04/06/2015 1136   MCHC 33.3 10/11/2017 0849   MCHC 32.4 04/06/2015 1136   RDW 13.1 10/11/2017 0849   LYMPHSABS 1.7 10/11/2017 0849   MONOABS 1.1 (H) 02/28/2007 0247   EOSABS 0.0 10/11/2017 0849   BASOSABS 0.0 10/11/2017 0849   Iron/TIBC/Ferritin/ %Sat No results found for: IRON, TIBC, FERRITIN, IRONPCTSAT Lipid Panel     Component Value Date/Time   CHOL 250 (H) 01/23/2018 0815   TRIG 101 01/23/2018 0815   HDL 72 01/23/2018 0815   LDLCALC 158 (H) 01/23/2018 0815   Hepatic Function Panel     Component Value Date/Time   PROT 7.0 01/23/2018 0815   ALBUMIN 3.8 01/23/2018 0815   AST 21 01/23/2018 0815   ALT 27 01/23/2018 0815   ALKPHOS 128 (H) 01/23/2018 0815   BILITOT 0.2 01/23/2018 0815      Component Value Date/Time   TSH 2.950 10/11/2017 0849   Results for Elisha PonderWOOTEN, Jazel LYNN (MRN [id number]) as of 02/18/2018 11:10  Ref. Range 01/23/2018 08:15  Vitamin D, 25-Hydroxy Latest Ref Range: 30.0 - 100.0 ng/mL 35.9    OBESITY BEHAVIORAL INTERVENTION VISIT  Today's visit was # 7   Starting weight: 266 lbs Starting date: 10/10/17 Today's weight : Weight: 237 lb (107.5 kg)  Today's date: 02/17/2018 Total lbs lost to date: 5029  ASK: We discussed the diagnosis of obesity with [name] Lynn Wooten today and [name] agreed to give us permission to discuss obesity behavioral modification therapy today.  ASSESS: [name] has the diagnosis of obesity and her BMI today is 41.9. [name] is in the action stage of change   ADVISE: [name] was educated on the multiple health risks of obesity as well as the benefit of weight loss to improve her health. She was advised of the need for  long term treatment and the importance of lifestyle modifications to improve her current health and to decrease her risk of future health problems.  AGREE: Multiple dietary modification options and treatment options  were discussed and [name] agreed to follow the recommendations documented in the above note.  ARRANGE: [name] was educated on the importance of frequent visits to treat obesity as outlined per CMS and USPSTF guidelines and agreed to schedule her next follow up appointment today.  I, Kirke Corinara Soares, am acting as transcriptionist for Wilder Gladearen D. Abdelrahman Nair, MD  I have reviewed the above documentation for accuracy and completeness, and I agree with the above. -Quillian Quincearen Zyniah Ferraiolo, MD

## 2018-02-25 ENCOUNTER — Ambulatory Visit: Payer: BLUE CROSS/BLUE SHIELD | Admitting: Family Medicine

## 2018-02-25 ENCOUNTER — Encounter: Payer: Self-pay | Admitting: Family Medicine

## 2018-02-25 VITALS — BP 110/72 | HR 77 | Temp 98.0°F | Resp 18 | Wt 241.4 lb

## 2018-02-25 DIAGNOSIS — R002 Palpitations: Secondary | ICD-10-CM | POA: Insufficient documentation

## 2018-02-25 DIAGNOSIS — Z7251 High risk heterosexual behavior: Secondary | ICD-10-CM | POA: Diagnosis not present

## 2018-02-25 DIAGNOSIS — F419 Anxiety disorder, unspecified: Secondary | ICD-10-CM | POA: Diagnosis not present

## 2018-02-25 DIAGNOSIS — F429 Obsessive-compulsive disorder, unspecified: Secondary | ICD-10-CM

## 2018-02-25 DIAGNOSIS — E785 Hyperlipidemia, unspecified: Secondary | ICD-10-CM

## 2018-02-25 DIAGNOSIS — E559 Vitamin D deficiency, unspecified: Secondary | ICD-10-CM

## 2018-02-25 HISTORY — DX: Palpitations: R00.2

## 2018-02-25 MED ORDER — SERTRALINE HCL 100 MG PO TABS: 100.0000 mg | ORAL_TABLET | Freq: Every day | ORAL | 3 refills | Status: DC

## 2018-02-25 NOTE — Patient Instructions (Signed)
Obsessive-Compulsive Disorder  Obsessive-compulsive disorder (OCD) is a brain-based anxiety disorder. People with OCD have obsessions, compulsions, or both. Obsessions are unwanted and distressing thoughts, ideas, or urges that keep entering your mind and result in anxiety. You may find yourself trying to ignore them. You may try to stop or undo them with a compulsion.  Compulsions are repetitive physical or mental acts that you feel you have to do. They may reduce or prevent any emotional distress, but in most instances, they are ineffective. Compulsions can be very time-consuming, often taking more than one hour each day. They can interfere with personal relationships and normal activities at home, school, or work.  OCD can begin in childhood, but it usually starts in young adulthood and continues throughout life. Many people with OCD also have depression or another mental health disorder.  What are the causes?  The cause of this condition is not known.  What increases the risk?  This condition is more like to develop in:  · People who have experienced trauma.  · People who have a family history of OCD.  · Women during and after pregnancy.  · People who have infections and post-infectious autoimmune syndrome.  · People who have other mental health conditions.  · People who abuse substances.  What are the signs or symptoms?  Symptoms of OCD include compulsions and obsessions. People with obsessions usually have a fear that something terrible will happen or that they will do something terrible. Examples of common obsessions include:  · Fear of contamination with germs, waste, or poisonous substances.  · Fear of making the wrong decision.  · Violent or sexual thoughts or urges towards others.  · Need for symmetry or exactness.  Examples of common compulsions include:  · Excessive handwashing or bathing due to fear of contamination.  · Checking things over and over to make sure you finished a task, such as making sure  you locked a door or unplugged a toaster.  · Repeating an act or phrase over and over, sometimes a specific number of times, until it feels right.  · Arranging and rearranging objects to keep them in a certain order.  · Having a very hard time making a decision and sticking to it.  How is this diagnosed?    OCD is diagnosed through an assessment by your health care provider. Your health care provider will ask questions about any obsessions or compulsions you have and how they affect your life. Your health care provider may also ask about your medical history, prescription medicines, and drug use. Certain medical conditions and substances can cause symptoms that are similar to OCD.  Your health care provider may also refer you to a mental health specialist.  How is this treated?  Treatment may include:  · Cognitive therapy. This is a form of talk therapy. The goal is to identify and change the irrational thoughts associated with obsessions.  · Behavioral therapy. A type of behavioral therapy called exposure and response prevention is often used. In this therapy, you will be exposed to the distressing situation that triggers your compulsion and be prevented from responding to it. With repetition of this process over time, you will no longer feel the distress or need to perform the compulsion.  · Self-soothing. Meditation, deep breathing, or yoga can help you manage the physiological symptoms of anxiety and can help with how you think.  · Medicine. Certain types of antidepressant medicine may help reduce or control OCD symptoms. Medicine is   most effective when used with cognitive or behavioral therapy.  Treatment usually involves a combination of therapy and medicines. For severe OCD that does not respond to talk therapy and medicine, brain surgery or electrical stimulation of specific areas of the brain (deep brain stimulation) may be considered.  Follow these instructions at home:  · Take over-the-counter and  prescription medicines only as told by your health care provider. Do not start taking any new medicines with approval from your health care provider.  · Consider joining a support group for people with OCD.  · Keep all follow-up visits as told by your health care provider. This is important.  Contact a health care provider if:  · You are not able to take your medicines as prescribed.  · Your symptoms get worse.  Get help right away if:  · You have suicidal thoughts or thoughts about hurting yourself or others.  If you ever feel like you may hurt yourself or others, or have thoughts about taking your own life, get help right away. You can go to your nearest emergency department or call:  · Your local emergency services (911 in the [place]  · A suicide crisis helpline, such as the National Suicide Prevention Lifeline at 1-800-273-8255. This is open 24-hours a day.  Summary  · Obsessive-compulsive disorder (OCD) is a brain-based anxiety disorder. People with OCD have obsessions, compulsions, or both.  · OCD can interfere with personal relationships and normal activities at home, school, or work.  · Treatment usually involves a combination of therapy and medicines.  This information is not intended to replace advice given to you by your health care provider. Make sure you discuss any questions you have with your health care provider.  Document Released: 01/02/2001 Document Revised: 10/24/2015 Document Reviewed: 10/24/2015  Elsevier Interactive Patient Education © 2019 [employer]

## 2018-02-25 NOTE — Assessment & Plan Note (Signed)
Can last for several minutes and occurs mostly when she lies on the left side. She has a strong family history of early cardiac disease, fatal and patient with high level of anxiety. Will refer to cardiology for further consideration

## 2018-02-27 LAB — RPR: RPR Ser Ql: NONREACTIVE

## 2018-02-27 LAB — HIV ANTIBODY (ROUTINE TESTING W REFLEX): HIV 1&2 Ab, 4th Generation: NONREACTIVE

## 2018-03-02 NOTE — Assessment & Plan Note (Signed)
Has been having some obsessive behaviors and increased irritability. Referred to behavioral health for counseling. Increase Sertraline to 100 mg po daily

## 2018-03-02 NOTE — Assessment & Plan Note (Signed)
Encouraged heart healthy diet, increase exercise, avoid trans fats, consider a krill oil cap daily 

## 2018-03-02 NOTE — Assessment & Plan Note (Signed)
Encouraged DASH diet, decrease po intake and increase exercise as tolerated. Needs 7-8 hours of sleep nightly. Avoid trans fats, eat small, frequent meals every 4-5 hours with lean proteins, complex carbs and healthy fats. Minimize simple carbs 

## 2018-03-02 NOTE — Assessment & Plan Note (Signed)
Supplement and monitor 

## 2018-03-02 NOTE — Progress Notes (Signed)
Subjective:    Patient ID: Selena Flores, female    DOB: [birth year], 38 y.o.   MRN: [id number]  No chief complaint on file.   HPI Patient is in today for follow up. She is having increased anxiety, irritability, and anhedonia. She denies any threat to herself or others but she notes she is experiencing more obsessive compulsive behavior. She is noting incresaed palpitations often lasting minutes but without any other associated symptoms. Denies CP/SOB/HA/congestion/fevers/GI or GU c/o. Taking meds as prescribed  Past Medical History:  Diagnosis Date  . Anxiety   . Chronic kidney disease    kidney infection 2008 ish  . Constipation   . Food allergy    onions itching  . GERD (gastroesophageal reflux disease)   . Hyperlipidemia   . Migraines     Past Surgical History:  Procedure Laterality Date  . CERVICAL CONE BIOPSY    . TONSILLECTOMY      Family History  Problem Relation Age of Onset  . Heart disease Mother        chf  . Obesity Mother   . Anxiety disorder Mother   . Skin cancer Father   . Cancer Father        skin cancer  . Heart disease Maternal Grandmother   . Heart disease Maternal Uncle     Social History   Socioeconomic History  . Marital status: Significant Other    Spouse name: Gwinda Passedam Burby  . Number of children: 0  . Years of education: Not on file  . Highest education level: Not on file  Occupational History  . 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 Financial resource strain: Not on file  . Food insecurity:    Worry: Not on file    Inability: Not on file  . Transportation needs:    Medical: Not on file    Non-medical: Not on file  Tobacco Use  . Smoking status: Never Smoker  . Smokeless tobacco: Never Used  . Tobacco comment: smoked for 1 yr socially in college  Substance and Sexual Activity  . Alcohol use: Yes    Comment: social   . Drug use: Yes  . Sexual activity: Yes    Lifestyle  . Physical activity:    Days per week: Not on file    Minutes per session: Not on file  . Stress: Not on file  Relationships  . Social connections:    Talks on phone: Not on file    Gets together: Not on file    Attends religious service: Not on file    Active member of club or organization: Not on file    Attends meetings of clubs or organizations: Not on file    Relationship status: Not on file  . Intimate partner violence:    Fear of current or ex partner: Not on file    Emotionally abused: Not on file    Physically abused: Not on file    Forced sexual activity: Not on file  Other Topics Concern  . Not on file  Social History Narrative   Lives with boyfriend, has 3 dogs, works in Clinical biochemistcustomer service with [employer]   No dietary restrictions, walking    Outpatient Medications Prior to Visit  Medication Sig Dispense Refill  . aspirin-acetaminophen-caffeine (EXCEDRIN MIGRAINE) 250-250-65 MG tablet Take 1 tablet by mouth every 6 (six) hours as needed for headache.    . calcium carbonate (  TUMS - DOSED IN MG ELEMENTAL CALCIUM) 500 MG chewable tablet Chew 1 tablet by mouth daily as needed for indigestion or heartburn.    . meloxicam (MOBIC) 15 MG tablet Take 1 tablet (15 mg total) by mouth daily as needed for pain. 30 tablet 1  . metFORMIN (GLUCOPHAGE) 500 MG tablet Take 1 tablet (500 mg total) by mouth daily with breakfast. 30 tablet 0  . Norgestimate-Ethinyl Estradiol Triphasic 0.18/0.215/0.25 MG-35 MCG tablet Take 1 tablet by mouth daily.    Marland Kitchen. omeprazole (PRILOSEC) 20 MG capsule Take 20 mg by mouth daily as needed.    . Vitamin D, Ergocalciferol, (DRISDOL) 1.25 MG (50000 UT) CAPS capsule Take 1 capsule (50,000 Units total) by mouth every 7 (seven) days. 4 capsule 0  . sertraline (ZOLOFT) 50 MG tablet Take 50 mg by mouth daily.     No facility-administered medications prior to visit.     Allergies  Allergen Reactions  . Ciprofloxacin Itching  . Rocephin  [Ceftriaxone] Itching  . Penicillins Itching and Rash    Has patient had a PCN reaction causing immediate rash, facial/tongue/throat swelling, SOB or lightheadedness with hypotension: Unknown Has patient had a PCN reaction causing severe rash involving mucus membranes or skin necrosis: Unknown Has patient had a PCN reaction that required hospitalization Unknown Has patient had a PCN reaction occurring within the last 10 years: Yes If all of the above answers are "NO", then may proceed with Cephalosporin use.     Review of Systems  Constitutional: Negative for fever and malaise/fatigue.  HENT: Negative for congestion.   Eyes: Negative for blurred vision.  Respiratory: Negative for shortness of breath.   Cardiovascular: Positive for palpitations. Negative for chest pain and leg swelling.  Gastrointestinal: Negative for abdominal pain, blood in stool and nausea.  Genitourinary: Negative for dysuria and frequency.  Musculoskeletal: Negative for falls.  Skin: Negative for rash.  Neurological: Negative for dizziness, loss of consciousness and headaches.  Endo/Heme/Allergies: Negative for environmental allergies.  Psychiatric/Behavioral: Negative for depression. The patient is nervous/anxious.        Objective:    Physical Exam Vitals signs and nursing note reviewed.  Constitutional:      General: She is not in acute distress.    Appearance: She is well-developed.  HENT:     Head: Normocephalic and atraumatic.     Nose: Nose normal.  Eyes:     General:        Right eye: No discharge.        Left eye: No discharge.  Neck:     Musculoskeletal: Normal range of motion and neck supple.  Cardiovascular:     Rate and Rhythm: Normal rate and regular rhythm.     Heart sounds: No murmur.  Pulmonary:     Effort: Pulmonary effort is normal.     Breath sounds: Normal breath sounds.  Abdominal:     General: Bowel sounds are normal.     Palpations: Abdomen is soft.     Tenderness: There  is no abdominal tenderness.  Skin:    General: Skin is warm and dry.  Neurological:     Mental Status: She is alert and oriented to person, place, and time.     BP 110/72 (BP Location: Left Arm, Patient Position: Sitting, Cuff Size: Normal)   Pulse 77   Temp 98 F (36.7 C) (Oral)   Resp 18   Wt 241 lb 6.4 oz (109.5 kg)   SpO2 97%   BMI 42.76 kg/m  Wt Readings from Last 3 Encounters:  02/25/18 241 lb 6.4 oz (109.5 kg)  02/17/18 237 lb (107.5 kg)  01/23/18 245 lb (111.1 kg)     Lab Results  Component Value Date   WBC 6.6 10/11/2017   HGB 14.1 10/11/2017   HCT 42.4 10/11/2017   PLT 285 04/06/2015   GLUCOSE 89 01/23/2018   CHOL 250 (H) 01/23/2018   TRIG 101 01/23/2018   HDL 72 01/23/2018   LDLCALC 158 (H) 01/23/2018   ALT 27 01/23/2018   AST 21 01/23/2018   NA 139 01/23/2018   K 4.8 01/23/2018   CL 102 01/23/2018   CREATININE 0.87 01/23/2018   BUN 14 01/23/2018   CO2 22 01/23/2018   TSH 2.950 10/11/2017   INR 0.9 05/12/2008   HGBA1C 5.4 01/23/2018    Lab Results  Component Value Date   TSH 2.950 10/11/2017   Lab Results  Component Value Date   WBC 6.6 10/11/2017   HGB 14.1 10/11/2017   HCT 42.4 10/11/2017   MCV 87 10/11/2017   PLT 285 04/06/2015   Lab Results  Component Value Date   NA 139 01/23/2018   K 4.8 01/23/2018   CO2 22 01/23/2018   GLUCOSE 89 01/23/2018   BUN 14 01/23/2018   CREATININE 0.87 01/23/2018   BILITOT 0.2 01/23/2018   ALKPHOS 128 (H) 01/23/2018   AST 21 01/23/2018   ALT 27 01/23/2018   PROT 7.0 01/23/2018   ALBUMIN 3.8 01/23/2018   CALCIUM 9.3 01/23/2018   ANIONGAP 10 04/06/2015   Lab Results  Component Value Date   CHOL 250 (H) 01/23/2018   Lab Results  Component Value Date   HDL 72 01/23/2018   Lab Results  Component Value Date   LDLCALC 158 (H) 01/23/2018   Lab Results  Component Value Date   TRIG 101 01/23/2018   No results found for: CHOLHDL Lab Results  Component Value Date   HGBA1C 5.4 01/23/2018        Assessment & Plan:   Problem List Items Addressed This Visit    Hyperlipidemia    Encouraged heart healthy diet, increase exercise, avoid trans fats, consider a krill oil cap daily      Anxiety - Primary    Has been having some obsessive behaviors and increased irritability. Referred to behavioral health for counseling. Increase Sertraline to 100 mg po daily      Relevant Medications   sertraline (ZOLOFT) 100 MG tablet   Other Relevant Orders   Ambulatory referral to Behavioral Health   Morbid obesity (HCC)    Encouraged DASH diet, decrease po intake and increase exercise as tolerated. Needs 7-8 hours of sleep nightly. Avoid trans fats, eat small, frequent meals every 4-5 hours with lean proteins, complex carbs and healthy fats. Minimize simple carbs      Vitamin D deficiency    Supplement and monitor      Intermittent palpitations    Can last for several minutes and occurs mostly when she lies on the left side. She has a strong family history of early cardiac disease, fatal and patient with high level of anxiety. Will refer to cardiology for further consideration      Relevant Orders   Ambulatory referral to Cardiology    Other Visit Diagnoses    Obsessive-compulsive disorder, unspecified type       Relevant Medications   sertraline (ZOLOFT) 100 MG tablet   Other Relevant Orders   Ambulatory referral to Behavioral Health  High risk heterosexual behavior       Relevant Orders   HIV Antibody (routine testing w rflx) (Completed)   RPR (Completed)      I have discontinued Hend L. Flores's sertraline. I am also having her start on sertraline. Additionally, I am having her maintain her Norgestimate-Ethinyl Estradiol Triphasic, aspirin-acetaminophen-caffeine, omeprazole, calcium carbonate, meloxicam, metFORMIN, and Vitamin D (Ergocalciferol).  Meds ordered this encounter  Medications  . sertraline (ZOLOFT) 100 MG tablet    Sig: Take 1 tablet (100 mg total) by  mouth daily.    Dispense:  30 tablet    Refill:  3     Danise Edge, MD

## 2018-03-03 DIAGNOSIS — Z6841 Body Mass Index (BMI) 40.0 and over, adult: Secondary | ICD-10-CM | POA: Diagnosis not present

## 2018-03-03 DIAGNOSIS — Z01419 Encounter for gynecological examination (general) (routine) without abnormal findings: Secondary | ICD-10-CM | POA: Diagnosis not present

## 2018-03-03 DIAGNOSIS — Z124 Encounter for screening for malignant neoplasm of cervix: Secondary | ICD-10-CM | POA: Diagnosis not present

## 2018-03-05 LAB — HM PAP SMEAR: HM Pap smear: NEGATIVE

## 2018-03-07 ENCOUNTER — Encounter: Payer: Self-pay | Admitting: Family Medicine

## 2018-03-07 NOTE — Telephone Encounter (Signed)
Please advise 

## 2018-03-12 ENCOUNTER — Encounter (INDEPENDENT_AMBULATORY_CARE_PROVIDER_SITE_OTHER): Payer: Self-pay

## 2018-03-12 ENCOUNTER — Ambulatory Visit (INDEPENDENT_AMBULATORY_CARE_PROVIDER_SITE_OTHER): Payer: Self-pay | Admitting: Family Medicine

## 2018-03-18 ENCOUNTER — Other Ambulatory Visit (INDEPENDENT_AMBULATORY_CARE_PROVIDER_SITE_OTHER): Payer: Self-pay | Admitting: Family Medicine

## 2018-03-18 DIAGNOSIS — E8881 Metabolic syndrome: Secondary | ICD-10-CM

## 2018-03-18 NOTE — Progress Notes (Signed)
Cardiology Office Note:    Date:  03/20/2018   ID:  Selena Flores, DOB [birth year], MRN [id number]  PCP:  Bradd Canary, MD  Cardiologist:  Norman Herrlich, MD   Referring MD: Bradd Canary, MD  ASSESSMENT:    1. Family history of heart disease   2. Intermittent palpitations   3. Hyperlipidemia, unspecified hyperlipidemia type    PLAN:    In order of problems listed above:  1. Further evaluation echocardiogram regarding cardiomyopathy.  If normal at this time I do no further cardiac evaluation 2. She has had a paucity of symptoms recently we discussed the utility of an ambulatory heart rhythm monitor and will decline at this point in time 3. I agree with the approach uses dressing obesity insulin resistance and lifestyle including exercise and will hold on lipid-lowering therapy at present  Next appointment  As needed   Medication Adjustments/Labs and Tests Ordered: Current medicines are reviewed at length with the patient today.  Concerns regarding medicines are outlined above.  Orders Placed This Encounter  Procedures  . EKG 12-Lead  . ECHOCARDIOGRAM COMPLETE   No orders of the defined types were placed in this encounter.    Chief Complaint  Patient presents with  . Palpitations  heart disease in my familt. What is my risk?  History of Present Illness:    Selena Flores is a 38 y.o. female with insulin resitance and hyperlipidemia who is being seen today for the evaluation of palpitation at the request of Bradd Canary, MD.  EKG 10/10/17 SRTH normal including QTc  Her predominant complaint is her family history and her risk of heart disease her mother died in her late 66s of sudden cardiac death with body weights of 4 to 500 pounds but the patient tells me it was presumed due to heart failure there was no autopsy maternal grandmother died of heart disease in her 32s and a maternal uncle also died early of heart disease.  She is not having edema  shortness of breath chest pain or syncope she has palpitation at times but not recently not severe it occurs nocturnally and is positional left side down brief and momentary.  Her cardiovascular risk include hyperlipidemia obesity and insulin resistance she is in a good lifestyle modification program is lost 40 pounds.  We discussed further evaluation particularly regarding cardiomyopathy echocardiogram ordered.  We discussed the utility of a cardiac CT calcium score to guide whether lipid-lowering therapy would be beneficial.  I informed his out-of-pocket expense not covered by insurance at this time she wishes to decline.  I think in view of her response to lifestyle modification that this is a reasonable choice as she presently is not at high risk group for lipid-lowering therapy and her overall cardiovascular risk is low.  She has no history of murmur congenital rheumatic heart disease  Past Medical History:  Diagnosis Date  . Anxiety   . Anxiety disorder 02/17/2015  . Chronic kidney disease    kidney infection 2008 ish  . Constipation   . Esophageal reflux 02/17/2015  . Food allergy    onions itching  . GERD (gastroesophageal reflux disease)   . Hyperlipidemia   . Intermittent palpitations 02/25/2018  . Melasma 02/17/2015  . Migraines   . Morbid obesity (HCC) 07/10/2017  . Morbid obesity with BMI of 45.0-49.9, adult (HCC) 02/17/2015  . 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 Plantar fasciitis 11/28/2017  . Preventative health  care 10/15/2017   GYN: Dr [facility]  . 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  . Vitamin D deficiency 11/28/2017    Past Surgical History:  Procedure Laterality Date  . CERVICAL CONE BIOPSY    . TONSILLECTOMY      Current Medications: Current Meds  Medication Sig  . aspirin-acetaminophen-caffeine (EXCEDRIN MIGRAINE) 250-250-65 MG tablet Take 1 tablet by mouth every 6 (six)  hours as needed for headache.  . calcium carbonate (TUMS - DOSED IN MG ELEMENTAL CALCIUM) 500 MG chewable tablet Chew 1 tablet by mouth daily as needed for indigestion or heartburn.  . meloxicam (MOBIC) 15 MG tablet Take 1 tablet (15 mg total) by mouth daily as needed for pain.  . metFORMIN (GLUCOPHAGE) 500 MG tablet Take 1 tablet (500 mg total) by mouth daily with breakfast.  . Norgestimate-Ethinyl Estradiol Triphasic 0.18/0.215/0.25 MG-35 MCG tablet Take 1 tablet by mouth daily.  Marland Kitchen omeprazole (PRILOSEC) 20 MG capsule Take 20 mg by mouth daily as needed.  . sertraline (ZOLOFT) 100 MG tablet Take 1 tablet (100 mg total) by mouth daily.  . Vitamin D, Ergocalciferol, (DRISDOL) 1.25 MG (50000 UT) CAPS capsule Take 1 capsule (50,000 Units total) by mouth every 7 (seven) days.     Allergies:   Ciprofloxacin; Rocephin [ceftriaxone]; and Penicillins   Social History   Socioeconomic History  . Marital status: Significant Other    Spouse name: Vaneza Tiemeyer  . Number of children: 0  . Years of education: Not on file  . Highest education level: Not on file  Occupational History  . Occupation: Arts development officer    Comment: Customer Service   Social Needs  . Financial resource strain: Not on file  . Food insecurity:    Worry: Not on file    Inability: Not on file  . Transportation needs:    Medical: Not on file    Non-medical: Not on file  Tobacco Use  . Smoking status: Never Smoker  . Smokeless tobacco: Never Used  . Tobacco comment: smoked for 1 yr socially in college  Substance and Sexual Activity  . Alcohol use: Yes    Comment: social-occ  . Drug use: Not Currently  . Sexual activity: Yes  Lifestyle  . Physical activity:    Days per week: Not on file    Minutes per session: Not on file  . Stress: Not on file  Relationships  . Social connections:    Talks on phone: Not on file    Gets together: Not on file    Attends religious service: Not on file    Active  member of club or organization: Not on file    Attends meetings of clubs or organizations: Not on file    Relationship status: Not on file  Other Topics Concern  . 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  ROS:   Review of Systems  Constitution: Positive for weight loss (40 lbs).  HENT: Negative.   Eyes: Negative.   Cardiovascular: Positive for palpitations.  Respiratory: Negative.   Endocrine: Negative.   Hematologic/Lymphatic: Negative.   Skin: Negative.  Musculoskeletal: Negative.   Gastrointestinal: Negative.   Genitourinary: Negative.   Neurological: Negative.   Psychiatric/Behavioral: Positive for depression.  Allergic/Immunologic: Negative.    Please see the history of present illness.     All other systems reviewed and are negative.  EKGs/Labs/Other Studies Reviewed:    The following studies were reviewed today:   EKG:  EKG is  ordered today.  The ekg ordered today demonstrates SRTH normal.sinus arrhythmia  Recent Labs: 10/11/2017: Hemoglobin 14.1; TSH 2.950 01/23/2018: ALT 27; BUN 14; Creatinine, Ser 0.87; Potassium 4.8; Sodium 139 A1C 5.4% Recent Lipid Panel    Component Value Date/Time   CHOL 250 (H) 01/23/2018 0815   TRIG 101 01/23/2018 0815   HDL 72 01/23/2018 0815   LDLCALC 158 (H) 01/23/2018 0815    Physical Exam:    VS:  BP 124/82 (BP Location: Left Arm, Patient Position: Sitting, Cuff Size: Normal)   Pulse 85   Ht 5\' 4"  (1.626 m)   Wt 232 lb 1.9 oz (105.3 kg)   SpO2 98%   BMI 39.84 kg/m     Wt Readings from Last 3 Encounters:  03/20/18 232 lb 1.9 oz (105.3 kg)  02/25/18  241 lb 6.4 oz (109.5 kg)  02/17/18 237 lb (107.5 kg)     GEN:  Well nourished, well developed in no acute distress HEENT: Normal NECK: No JVD; No carotid bruits LYMPHATICS: No lymphadenopathy CARDIAC: RRR, no murmurs, rubs, gallops RESPIRATORY:  Clear to auscultation without rales, wheezing or rhonchi  ABDOMEN: Soft, non-tender, non-distended MUSCULOSKELETAL:  No edema; No deformity  SKIN: Warm and dry NEUROLOGIC:  Alert and oriented x 3 PSYCHIATRIC:  Normal affect     Signed, Norman Herrlich, MD  03/20/2018 9:05 AM    [facility]

## 2018-03-19 MED ORDER — METFORMIN HCL 500 MG PO TABS: 500.0000 mg | ORAL_TABLET | Freq: Every day | ORAL | 0 refills | Status: DC

## 2018-03-20 ENCOUNTER — Encounter: Payer: Self-pay | Admitting: Cardiology

## 2018-03-20 ENCOUNTER — Ambulatory Visit: Payer: BLUE CROSS/BLUE SHIELD | Admitting: Cardiology

## 2018-03-20 VITALS — BP 124/82 | HR 85 | Ht 64.0 in | Wt 232.1 lb

## 2018-03-20 DIAGNOSIS — Z8249 Family history of ischemic heart disease and other diseases of the circulatory system: Secondary | ICD-10-CM | POA: Diagnosis not present

## 2018-03-20 DIAGNOSIS — R002 Palpitations: Secondary | ICD-10-CM | POA: Diagnosis not present

## 2018-03-20 DIAGNOSIS — E785 Hyperlipidemia, unspecified: Secondary | ICD-10-CM

## 2018-03-20 NOTE — Patient Instructions (Signed)
Medication Instructions:  Your physician recommends that you continue on your current medications as directed. Please refer to the Current Medication list given to you today.  If you need a refill on your cardiac medications before your next appointment, please call your pharmacy.   Lab work: None  If you have labs (blood work) drawn today and your tests are completely normal, you will receive your results only by: . MyChart Message (if you have MyChart) OR . A paper copy in the mail If you have any lab test that is abnormal or we need to change your treatment, we will call you to review the results.  Testing/Procedures: You had an EKG today.   Your physician has requested that you have an echocardiogram. Echocardiography is a painless test that uses sound waves to create images of your heart. It provides your doctor with information about the size and shape of your heart and how well your heart's chambers and valves are working. This procedure takes approximately one hour. There are no restrictions for this procedure.    Follow-Up: At [facility], you and your health needs are our priority.  As part of our continuing mission to provide you with exceptional heart care, we have created designated Provider Care Teams.  These Care Teams include your primary Cardiologist (physician) and Advanced Practice Providers (APPs -  Physician Assistants and Nurse Practitioners) who all work together to provide you with the care you need, when you need it. You will need a follow up appointment as needed if symptoms worsen or fail to improve.       Echocardiogram An echocardiogram is a procedure that uses painless sound waves (ultrasound) to produce an image of the heart. Images from an echocardiogram can provide important information about:  Signs of coronary artery disease (CAD).  Aneurysm detection. An aneurysm is a weak or damaged part of an artery wall that bulges out from the normal force  of blood pumping through the body.  Heart size and shape. Changes in the size or shape of the heart can be associated with certain conditions, including heart failure, aneurysm, and CAD.  Heart muscle function.  Heart valve function.  Signs of a past heart attack.  Fluid buildup around the heart.  Thickening of the heart muscle.  A tumor or infectious growth around the heart valves. Tell a health care provider about:  Any allergies you have.  All medicines you are taking, including vitamins, herbs, eye drops, creams, and over-the-counter medicines.  Any blood disorders you have.  Any surgeries you have had.  Any medical conditions you have.  Whether you are pregnant or may be pregnant. What are the risks? Generally, this is a safe procedure. However, problems may occur, including:  Allergic reaction to dye (contrast) that may be used during the procedure. What happens before the procedure? No specific preparation is needed. You may eat and drink normally. What happens during the procedure?   An IV tube may be inserted into one of your veins.  You may receive contrast through this tube. A contrast is an injection that improves the quality of the pictures from your heart.  A gel will be applied to your chest.  A wand-like tool (transducer) will be moved over your chest. The gel will help to transmit the sound waves from the transducer.  The sound waves will harmlessly bounce off of your heart to allow the heart images to be captured in real-time motion. The images will be recorded on   a computer. The procedure may vary among health care providers and hospitals. What happens after the procedure?  You may return to your normal, everyday life, including diet, activities, and medicines, unless your health care provider tells you not to do that. Summary  An echocardiogram is a procedure that uses painless sound waves (ultrasound) to produce an image of the heart.  Images  from an echocardiogram can provide important information about the size and shape of your heart, heart muscle function, heart valve function, and fluid buildup around your heart.  You do not need to do anything to prepare before this procedure. You may eat and drink normally.  After the echocardiogram is completed, you may return to your normal, everyday life, unless your health care provider tells you not to do that. This information is not intended to replace advice given to you by your health care provider. Make sure you discuss any questions you have with your health care provider. Document Released: 01/06/2000 Document Revised: 02/11/2016 Document Reviewed: 02/11/2016 Elsevier Interactive Patient Education  2019 [employer]    

## 2018-03-24 ENCOUNTER — Ambulatory Visit (HOSPITAL_BASED_OUTPATIENT_CLINIC_OR_DEPARTMENT_OTHER): Payer: BLUE CROSS/BLUE SHIELD

## 2018-03-27 ENCOUNTER — Encounter: Payer: Self-pay | Admitting: Family Medicine

## 2018-03-31 ENCOUNTER — Telehealth: Payer: Self-pay | Admitting: *Deleted

## 2018-03-31 NOTE — Telephone Encounter (Signed)
Received Medical records from [facility]; forwarded to provider/SLS 03/09

## 2018-04-03 ENCOUNTER — Encounter (INDEPENDENT_AMBULATORY_CARE_PROVIDER_SITE_OTHER): Payer: Self-pay | Admitting: Family Medicine

## 2018-04-03 ENCOUNTER — Ambulatory Visit (HOSPITAL_BASED_OUTPATIENT_CLINIC_OR_DEPARTMENT_OTHER): Payer: BLUE CROSS/BLUE SHIELD

## 2018-04-03 ENCOUNTER — Other Ambulatory Visit: Payer: Self-pay

## 2018-04-03 ENCOUNTER — Ambulatory Visit (INDEPENDENT_AMBULATORY_CARE_PROVIDER_SITE_OTHER): Payer: BLUE CROSS/BLUE SHIELD | Admitting: Family Medicine

## 2018-04-03 VITALS — BP 101/69 | HR 96 | Ht 63.0 in | Wt 227.0 lb

## 2018-04-03 DIAGNOSIS — Z9189 Other specified personal risk factors, not elsewhere classified: Secondary | ICD-10-CM | POA: Diagnosis not present

## 2018-04-03 DIAGNOSIS — E8881 Metabolic syndrome: Secondary | ICD-10-CM

## 2018-04-03 DIAGNOSIS — E559 Vitamin D deficiency, unspecified: Secondary | ICD-10-CM | POA: Diagnosis not present

## 2018-04-03 DIAGNOSIS — Z6841 Body Mass Index (BMI) 40.0 and over, adult: Secondary | ICD-10-CM

## 2018-04-03 MED ORDER — VITAMIN D (ERGOCALCIFEROL) 1.25 MG (50000 UNIT) PO CAPS: 50000.0000 [IU] | ORAL_CAPSULE | ORAL | 0 refills | Status: DC

## 2018-04-03 MED ORDER — METFORMIN HCL 500 MG PO TABS: 500.0000 mg | ORAL_TABLET | Freq: Every day | ORAL | 0 refills | Status: DC

## 2018-04-04 NOTE — Progress Notes (Signed)
Office: [phone]  /  Fax: [phone]   HPI:   Chief Complaint: OBESITY Selena Flores is here to discuss her progress with her obesity treatment plan. She is on the keep a food journal with 1500 calories and 75 grams of protein daily and is following her eating plan approximately 70 % of the time. She states she is exercising 0 minutes 0 times per week. Selena Flores's last visit was approximately 2 months ago, due to working on anxiety issues which are slowly improving. She lost weight during this times, but partially due to skipping meals and no grocery shopping.  Her weight is 227 lb (103 kg) today and has had a weight loss of 10 pounds over a period of 6 to 7 weeks since her last visit. She has lost 39 lbs since starting treatment with [place].  Pre-Diabetes Selena Flores has a diagnosis of pre-diabetes based on her elevated Hgb A1c and was informed this puts her at greater risk of developing diabetes. She is stable on metformin and diet, and she denies nausea, vomiting, or hypoglycemia. She continues to work on diet and exercise to decrease risk of diabetes.   At risk for diabetes Glendean is at higher than average risk for developing diabetes due to her obesity and pre-diabetes. She currently denies polyuria or polydipsia.  Vitamin D Deficiency Selena Flores has a diagnosis of vitamin D deficiency. She is stable on prescription Vit D, but level is not yet at goal. She denies nausea, vomiting or muscle weakness.  ASSESSMENT AND PLAN:  Insulin resistance - Plan: metFORMIN (GLUCOPHAGE) 500 MG tablet  Vitamin D deficiency - Plan: Vitamin D, Ergocalciferol, (DRISDOL) 1.25 MG (50000 UT) CAPS capsule  At risk for diabetes mellitus  Class 3 severe obesity with serious comorbidity and body mass index (BMI) of 40.0 to 44.9 in adult, unspecified obesity type [facility])  PLAN:  Pre-Diabetes Selena Flores will continue to work on weight loss, exercise, and decreasing simple carbohydrates in her diet to help decrease  the risk of diabetes. We dicussed metformin including benefits and risks. She was informed that eating too many simple carbohydrates or too many calories at one sitting increases the likelihood of GI side effects. Selena Flores agrees to continue taking metformin 500 mg q AM #30 and we will refill for 1 months. Selena Flores agrees to follow up with our clinic in 4 weeks as directed to monitor her progress.  Diabetes risk counseling Selena Flores was given extended (15 minutes) diabetes prevention counseling today. She is 38 y.o. female and has risk factors for diabetes including obesity and pre-diabetes. We discussed intensive lifestyle modifications today with an emphasis on weight loss as well as increasing exercise and decreasing simple carbohydrates in her diet.  Vitamin D Deficiency Selena Flores was informed that low vitamin D levels contributes to fatigue and are associated with obesity, breast, and colon cancer. Selena Flores agrees to continue taking prescription Vit D @50 ,000 IU every week #4 and we will refill for 1 month. She will follow up for routine testing of vitamin D, at least 2-3 times per year. She was informed of the risk of over-replacement of vitamin D and agrees to not increase her dose unless she discusses this with [place] first. Selena Flores agrees to follow up with our clinic in 4 weeks.  Obesity Selena Flores is currently in the action stage of change. As such, her goal is to continue with weight loss efforts She has agreed to keep a food journal with 1500 calories and 80+ grams of protein daily Selena Flores has been instructed  to work up to a goal of 150 minutes of combined cardio and strengthening exercise per week for weight loss and overall health benefits. We discussed the following Behavioral Modification Strategies today: increasing lean protein intake, decreasing simple carbohydrates, work on meal planning and easy cooking plans, emotional eating strategies, avoiding temptations, and planning for  success   Selena Flores has agreed to follow up with our clinic in 4 weeks. She was informed of the importance of frequent follow up visits to maximize her success with intensive lifestyle modifications for her multiple health conditions.  ALLERGIES: Allergies  Allergen Reactions   Ciprofloxacin Itching   Rocephin [Ceftriaxone] Itching   Penicillins Itching and Rash    Has patient had a PCN reaction causing immediate rash, facial/tongue/throat swelling, SOB or lightheadedness with hypotension: Unknown Has patient had a PCN reaction causing severe rash involving mucus membranes or skin necrosis: Unknown Has patient had a PCN reaction that required hospitalization Unknown Has patient had a PCN reaction occurring within the last 10 years: Yes If all of the above answers are "NO", then may proceed with Cephalosporin use.     MEDICATIONS: Current Outpatient Medications on File Prior to Visit  Medication Sig Dispense Refill   aspirin-acetaminophen-caffeine (EXCEDRIN MIGRAINE) 250-250-65 MG tablet Take 1 tablet by mouth every 6 (six) hours as needed for headache.     calcium carbonate (TUMS - DOSED IN MG ELEMENTAL CALCIUM) 500 MG chewable tablet Chew 1 tablet by mouth daily as needed for indigestion or heartburn.     meloxicam (MOBIC) 15 MG tablet Take 1 tablet (15 mg total) by mouth daily as needed for pain. 30 tablet 1   Norgestimate-Ethinyl Estradiol Triphasic 0.18/0.215/0.25 MG-35 MCG tablet Take 1 tablet by mouth daily.     omeprazole (PRILOSEC) 20 MG capsule Take 20 mg by mouth daily as needed.     sertraline (ZOLOFT) 100 MG tablet Take 1 tablet (100 mg total) by mouth daily. 30 tablet 3   No current facility-administered medications on file prior to visit.     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   Patellofemoral syndrome 02/17/2015   Plantar fasciitis 11/28/2017   Preventative health care 10/15/2017   GYN: Dr Tami Ribas [facility]   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   Vitamin D deficiency 11/28/2017    PAST SURGICAL HISTORY: Past Surgical History:  Procedure Laterality Date   CERVICAL CONE BIOPSY     TONSILLECTOMY      SOCIAL HISTORY: Social History   Tobacco Use   Smoking status: Never Smoker   Smokeless tobacco: Never Used   Tobacco comment: smoked for 1 yr socially in college  Substance Use Topics   Alcohol use: Yes    Comment: social-occ   Drug use: Not Currently    FAMILY HISTORY: Family History  Problem Relation Age of Onset   Heart disease Mother        chf   Obesity Mother    Anxiety disorder Mother  Heart failure Mother    Skin cancer Father    Cancer Father        skin cancer   Heart disease Maternal Grandmother    Heart disease Maternal Uncle     ROS: Review of Systems  Constitutional: Positive for weight loss.  Gastrointestinal: Negative for nausea and vomiting.  Genitourinary: Negative for frequency.  Musculoskeletal:       Negative muscle weakness  Endo/Heme/Allergies: Negative for polydipsia.       Negative hypoglycemia    PHYSICAL EXAM: Blood pressure 101/69, pulse 96, height 5\' 3"  (1.6 m), weight 227 lb (103 kg), SpO2 97 %. Body mass index is 40.21 kg/m. Physical Exam Vitals signs reviewed.  Constitutional:      Appearance: Normal appearance. She is obese.  Cardiovascular:     Rate and Rhythm: Normal rate.     Pulses: Normal pulses.  Pulmonary:     Effort:  Pulmonary effort is normal.     Breath sounds: Normal breath sounds.  Musculoskeletal: Normal range of motion.  Skin:    General: Skin is warm and dry.  Neurological:     Mental Status: She is alert and oriented to person, place, and time.  Psychiatric:        Mood and Affect: Mood normal.        Behavior: Behavior normal.     RECENT LABS AND TESTS: BMET    Component Value Date/Time   NA 139 01/23/2018 0815   K 4.8 01/23/2018 0815   CL 102 01/23/2018 0815   CO2 22 01/23/2018 0815   GLUCOSE 89 01/23/2018 0815   GLUCOSE 106 (H) 04/06/2015 1136   BUN 14 01/23/2018 0815   CREATININE 0.87 01/23/2018 0815   CALCIUM 9.3 01/23/2018 0815   GFRNONAA 87 01/23/2018 0815   GFRAA 100 01/23/2018 0815   Lab Results  Component Value Date   HGBA1C 5.4 01/23/2018   HGBA1C 5.4 10/11/2017   Lab Results  Component Value Date   INSULIN 7.4 01/23/2018   INSULIN 10.3 10/11/2017   CBC    Component Value Date/Time   WBC 6.6 10/11/2017 0849   WBC 10.5 04/06/2015 1136   RBC 4.85 10/11/2017 0849   RBC 5.07 04/06/2015 1136   HGB 14.1 10/11/2017 0849   HCT 42.4 10/11/2017 0849   PLT 285 04/06/2015 1136   MCV 87 10/11/2017 0849   MCH 29.1 10/11/2017 0849   MCH 29.4 04/06/2015 1136   MCHC 33.3 10/11/2017 0849   MCHC 32.4 04/06/2015 1136   RDW 13.1 10/11/2017 0849   LYMPHSABS 1.7 10/11/2017 0849   MONOABS 1.1 (H) 02/28/2007 0247   EOSABS 0.0 10/11/2017 0849   BASOSABS 0.0 10/11/2017 0849   Iron/TIBC/Ferritin/ %Sat No results found for: IRON, TIBC, FERRITIN, IRONPCTSAT Lipid Panel     Component Value Date/Time   CHOL 250 (H) 01/23/2018 0815   TRIG 101 01/23/2018 0815   HDL 72 01/23/2018 0815   LDLCALC 158 (H) 01/23/2018 0815   Hepatic Function Panel     Component Value Date/Time   PROT 7.0 01/23/2018 0815   ALBUMIN 3.8 01/23/2018 0815   AST 21 01/23/2018 0815   ALT 27 01/23/2018 0815   ALKPHOS 128 (H) 01/23/2018 0815   BILITOT 0.2 01/23/2018 0815      Component Value  Date/Time   TSH 2.950 10/11/2017 0849      OBESITY BEHAVIORAL INTERVENTION VISIT  Today's visit was # 8   Starting weight: 266 lbs Starting date: 10/10/17 Today's weight : 227  lbs  Today's date: 04/03/2018 Total lbs lost to date: 39    04/03/2018  Height 5\' 3"  (1.6 m)  Weight 227 lb (103 kg)  BMI (Calculated) 40.22  BLOOD PRESSURE - SYSTOLIC 101  BLOOD PRESSURE - DIASTOLIC 69   Body Fat % 48.9 %  Total Body Water (lbs) 89.4 lbs     ASK: We discussed the diagnosis of obesity with Elisha Ponder today and Jessicca agreed to give [place] permission to discuss obesity behavioral modification therapy today.  ASSESS: Malik has the diagnosis of obesity and her BMI today is 40.22 Larri is in the action stage of change   ADVISE: Ramandeep was educated on the multiple health risks of obesity as well as the benefit of weight loss to improve her health. She was advised of the need for long term treatment and the importance of lifestyle modifications to improve her current health and to decrease her risk of future health problems.  AGREE: Multiple dietary modification options and treatment options were discussed and  Jniya agreed to follow the recommendations documented in the above note.  ARRANGE: Korianne was educated on the importance of frequent visits to treat obesity as outlined per CMS and USPSTF guidelines and agreed to schedule her next follow up appointment today.  I, Burt Knack, am acting as transcriptionist for Quillian Quince, MD  I have reviewed the above documentation for accuracy and completeness, and I agree with the above. -Quillian Quince, MD

## 2018-04-07 ENCOUNTER — Encounter: Payer: Self-pay | Admitting: Family Medicine

## 2018-04-15 ENCOUNTER — Ambulatory Visit: Payer: BLUE CROSS/BLUE SHIELD | Admitting: Family Medicine

## 2018-04-16 ENCOUNTER — Encounter (INDEPENDENT_AMBULATORY_CARE_PROVIDER_SITE_OTHER): Payer: Self-pay

## 2018-05-01 ENCOUNTER — Encounter (INDEPENDENT_AMBULATORY_CARE_PROVIDER_SITE_OTHER): Payer: Self-pay | Admitting: Family Medicine

## 2018-05-01 ENCOUNTER — Encounter (INDEPENDENT_AMBULATORY_CARE_PROVIDER_SITE_OTHER): Payer: Self-pay

## 2018-05-01 ENCOUNTER — Ambulatory Visit (INDEPENDENT_AMBULATORY_CARE_PROVIDER_SITE_OTHER): Payer: BLUE CROSS/BLUE SHIELD | Admitting: Family Medicine

## 2018-05-01 ENCOUNTER — Other Ambulatory Visit: Payer: Self-pay

## 2018-05-01 DIAGNOSIS — E8881 Metabolic syndrome: Secondary | ICD-10-CM

## 2018-05-01 DIAGNOSIS — E559 Vitamin D deficiency, unspecified: Secondary | ICD-10-CM

## 2018-05-01 DIAGNOSIS — E88819 Insulin resistance, unspecified: Secondary | ICD-10-CM

## 2018-05-01 DIAGNOSIS — Z6841 Body Mass Index (BMI) 40.0 and over, adult: Secondary | ICD-10-CM

## 2018-05-01 DIAGNOSIS — E66813 Obesity, class 3: Secondary | ICD-10-CM

## 2018-05-01 MED ORDER — VITAMIN D (ERGOCALCIFEROL) 1.25 MG (50000 UNIT) PO CAPS: 50000.0000 [IU] | ORAL_CAPSULE | ORAL | 0 refills | Status: DC

## 2018-05-01 MED ORDER — METFORMIN HCL 500 MG PO TABS: 500.0000 mg | ORAL_TABLET | Freq: Every day | ORAL | 0 refills | Status: DC

## 2018-05-05 ENCOUNTER — Ambulatory Visit (INDEPENDENT_AMBULATORY_CARE_PROVIDER_SITE_OTHER): Payer: BLUE CROSS/BLUE SHIELD | Admitting: Psychology

## 2018-05-05 DIAGNOSIS — F411 Generalized anxiety disorder: Secondary | ICD-10-CM | POA: Diagnosis not present

## 2018-05-05 NOTE — Progress Notes (Signed)
Office: [phone]  /  Fax: [phone] TeleHealth Visit:  [name] Lynn Flores has verbally consented to this TeleHealth visit today. The patient is located at home, the provider is located at the [facility] office. The participants in this visit include the listed provider and patient and provider's assistant. The visit was conducted today via face time.  HPI:   Chief Complaint: OBESITY Selena Flores is here to discuss her progress with her obesity treatment plan. She is on the keep a food journal with 1500 calories and 80+ grams of protein daily and is following her eating plan approximately 35 % of the time. She states she is exercising 0 minutes 0 times per week. Selena Flores feels she is doing well maintaining her weight. She is following her plan approximately 30% of the time. Her live-in boyfriend is sometimes supporting her and sometimes making less ideal foods. Her carbohydrate have been increased, and vegetables and lean protein has decreased.  We were unable to weigh the patient today for this TeleHealth visit. She feels as if she has maintained her weight since her last visit. She has lost 39 lbs since starting treatment with us.  Insulin Resistance Selena Flores has a diagnosis of insulin resistance based on her elevated fasting insulin level >5. Although [name]'s blood glucose readings are still under good control, insulin resistance puts her at greater risk of metabolic syndrome and diabetes. She is stable on metformin and denies nausea, vomiting, or hypoglycemia. She is working on diet, but simple carbohydrates have been increased.  Vitamin D Deficiency Selena Flores has a diagnosis of vitamin D deficiency. She is currently taking prescription Vit D, but level is not yet at goal and is improving. She denies nausea, vomiting or muscle weakness.  ASSESSMENT AND PLAN:  Insulin resistance - Plan: metFORMIN (GLUCOPHAGE) 500 MG tablet  Vitamin D deficiency - Plan: Vitamin D,  Ergocalciferol, (DRISDOL) 1.25 MG (50000 UT) CAPS capsule  Class 3 severe obesity with serious comorbidity and body mass index (BMI) of 40.0 to 44.9 in adult, unspecified obesity type (HCC)  PLAN:  Insulin Resistance Selena Flores will continue to work on weight loss, diet, exercise, and decreasing simple carbohydrates in her diet to help decrease the risk of diabetes. We dicussed metformin including benefits and risks. She was informed that eating too many simple carbohydrates or too many calories at one sitting increases the likelihood of GI side effects. Selena Flores agrees to continue taking metformin 500 mg q AM #30 and we will refill for 1 month. Selena Flores agrees to follow up with our clinic in 3 weeks as directed to monitor her progress.  Vitamin D Deficiency Selena Flores was informed that low vitamin D levels contributes to fatigue and are associated with obesity, breast, and colon cancer. Selena Flores agrees to continue taking prescription Vit D @50 ,000 IU every week #4 and we will refill for 1 month. She will follow up for routine testing of vitamin D, at least 2-3 times per year. She was informed of the risk of over-replacement of vitamin D and agrees to not increase her dose unless she discusses this with us first. Selena Flores agrees to follow up with our clinic in 3 weeks.  Obesity Selena Flores is currently in the action stage of change. As such, her goal is to continue with weight loss efforts She has agreed to keep a food journal with 1500 calories and 90 grams of protein daily Dinner recipes were sent via MyChart to Selena JeffersonMichelle Flores has been instructed to work up to a goal of  150 minutes of combined cardio and strengthening exercise per week for weight loss and overall health benefits. We discussed the following Behavioral Modification Strategies today: increasing lean protein intake, decreasing simple carbohydrates, increasing vegetables, work on meal planning and easy cooking plans, ways to avoid boredom  eating, and better snacking choices   [name] has agreed to follow up with our clinic in 3 weeks. She was informed of the importance of frequent follow up visits to maximize her success with intensive lifestyle modifications for her multiple health conditions.  ALLERGIES: Allergies  Allergen Reactions  . Ciprofloxacin Itching  . Rocephin [Ceftriaxone] Itching  . Penicillins Itching and Rash    Has patient had a PCN reaction causing immediate rash, facial/tongue/throat swelling, SOB or lightheadedness with hypotension: Unknown Has patient had a PCN reaction causing severe rash involving mucus membranes or skin necrosis: Unknown Has patient had a PCN reaction that required hospitalization Unknown Has patient had a PCN reaction occurring within the last 10 years: Yes If all of the above answers are "NO", then may proceed with Cephalosporin use.     MEDICATIONS: Current Outpatient Medications on File Prior to Visit  Medication Sig Dispense Refill  . aspirin-acetaminophen-caffeine (EXCEDRIN MIGRAINE) 250-250-65 MG tablet Take 1 tablet by mouth every 6 (six) hours as needed for headache.    . calcium carbonate (TUMS - DOSED IN MG ELEMENTAL CALCIUM) 500 MG chewable tablet Chew 1 tablet by mouth daily as needed for indigestion or heartburn.    . meloxicam (MOBIC) 15 MG tablet Take 1 tablet (15 mg total) by mouth daily as needed for pain. 30 tablet 1  . Norgestimate-Ethinyl Estradiol Triphasic 0.18/0.215/0.25 MG-35 MCG tablet Take 1 tablet by mouth daily.    Marland Kitchen omeprazole (PRILOSEC) 20 MG capsule Take 20 mg by mouth daily as needed.    . sertraline (ZOLOFT) 100 MG tablet Take 1 tablet (100 mg total) by mouth daily. 30 tablet 3   No current facility-administered medications on file prior to visit.     PAST MEDICAL HISTORY: Past Medical History:  Diagnosis Date  . Anxiety   . Anxiety disorder 02/17/2015  . Chronic kidney disease    kidney infection 2008 ish  . Constipation   . Esophageal  reflux 02/17/2015  . Food allergy    onions itching  . GERD (gastroesophageal reflux disease)   . Hyperlipidemia   . Intermittent palpitations 02/25/2018  . Melasma 02/17/2015  . Migraines   . Morbid obesity (HCC) 07/10/2017  . Morbid obesity with BMI of 45.0-49.9, adult (HCC) 02/17/2015  . 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 Plantar fasciitis 11/28/2017  . Preventative health care 10/15/2017   GYN: Dr Tami Ribas [facility]  . 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  . Vitamin D deficiency 11/28/2017    PAST SURGICAL HISTORY: Past Surgical History:  Procedure Laterality Date  . CERVICAL CONE BIOPSY    . TONSILLECTOMY      SOCIAL HISTORY: Social History   Tobacco Use  . Smoking status: Never Smoker  . Smokeless tobacco: Never Used  . Tobacco comment: smoked for 1 yr socially in college  Substance Use Topics  . Alcohol use: Yes    Comment: social-occ  . Drug use: Not Currently    FAMILY HISTORY: Family History  Problem Relation Age of Onset  . Heart disease Mother        chf  . Obesity Mother   . Anxiety disorder Mother   . Heart failure  Mother   . Skin cancer Father   . Cancer Father        skin cancer  . Heart disease Maternal Grandmother   . Heart disease Maternal Uncle     ROS: Review of Systems  Constitutional: Negative for weight loss.  Gastrointestinal: Negative for nausea and vomiting.  Musculoskeletal:       Negative muscle weakness  Endo/Heme/Allergies:       Negative hypoglycemia    PHYSICAL EXAM: Pt in no acute distress  RECENT LABS AND TESTS: BMET    Component Value Date/Time   NA 139 01/23/2018 0815   K 4.8 01/23/2018 0815   CL 102 01/23/2018 0815   CO2 22 01/23/2018 0815   GLUCOSE 89 01/23/2018 0815   GLUCOSE 106 (H) 04/06/2015 1136   BUN 14 01/23/2018 0815   CREATININE 0.87 01/23/2018 0815   CALCIUM 9.3 01/23/2018 0815   GFRNONAA 87 01/23/2018 0815    GFRAA 100 01/23/2018 0815   Lab Results  Component Value Date   HGBA1C 5.4 01/23/2018   HGBA1C 5.4 10/11/2017   Lab Results  Component Value Date   INSULIN 7.4 01/23/2018   INSULIN 10.3 10/11/2017   CBC    Component Value Date/Time   WBC 6.6 10/11/2017 0849   WBC 10.5 04/06/2015 1136   RBC 4.85 10/11/2017 0849   RBC 5.07 04/06/2015 1136   HGB 14.1 10/11/2017 0849   HCT 42.4 10/11/2017 0849   PLT 285 04/06/2015 1136   MCV 87 10/11/2017 0849   MCH 29.1 10/11/2017 0849   MCH 29.4 04/06/2015 1136   MCHC 33.3 10/11/2017 0849   MCHC 32.4 04/06/2015 1136   RDW 13.1 10/11/2017 0849   LYMPHSABS 1.7 10/11/2017 0849   MONOABS 1.1 (H) 02/28/2007 0247   EOSABS 0.0 10/11/2017 0849   BASOSABS 0.0 10/11/2017 0849   Iron/TIBC/Ferritin/ %Sat No results found for: IRON, TIBC, FERRITIN, IRONPCTSAT Lipid Panel     Component Value Date/Time   CHOL 250 (H) 01/23/2018 0815   TRIG 101 01/23/2018 0815   HDL 72 01/23/2018 0815   LDLCALC 158 (H) 01/23/2018 0815   Hepatic Function Panel     Component Value Date/Time   PROT 7.0 01/23/2018 0815   ALBUMIN 3.8 01/23/2018 0815   AST 21 01/23/2018 0815   ALT 27 01/23/2018 0815   ALKPHOS 128 (H) 01/23/2018 0815   BILITOT 0.2 01/23/2018 0815      Component Value Date/Time   TSH 2.950 10/11/2017 0849      I, Burt Knack, am acting as transcriptionist for Quillian Quince, MD I have reviewed the above documentation for accuracy and completeness, and I agree with the above. -Quillian Quince, MD

## 2018-05-19 ENCOUNTER — Ambulatory Visit (INDEPENDENT_AMBULATORY_CARE_PROVIDER_SITE_OTHER): Payer: BLUE CROSS/BLUE SHIELD | Admitting: Psychology

## 2018-05-19 DIAGNOSIS — F411 Generalized anxiety disorder: Secondary | ICD-10-CM

## 2018-05-22 ENCOUNTER — Encounter (INDEPENDENT_AMBULATORY_CARE_PROVIDER_SITE_OTHER): Payer: Self-pay | Admitting: Family Medicine

## 2018-05-22 ENCOUNTER — Ambulatory Visit (INDEPENDENT_AMBULATORY_CARE_PROVIDER_SITE_OTHER): Payer: BLUE CROSS/BLUE SHIELD | Admitting: Family Medicine

## 2018-05-22 ENCOUNTER — Other Ambulatory Visit: Payer: Self-pay

## 2018-05-22 DIAGNOSIS — E8881 Metabolic syndrome: Secondary | ICD-10-CM | POA: Diagnosis not present

## 2018-05-22 DIAGNOSIS — Z6841 Body Mass Index (BMI) 40.0 and over, adult: Secondary | ICD-10-CM

## 2018-05-22 DIAGNOSIS — E559 Vitamin D deficiency, unspecified: Secondary | ICD-10-CM

## 2018-05-22 MED ORDER — METFORMIN HCL 500 MG PO TABS: 500.0000 mg | ORAL_TABLET | Freq: Every day | ORAL | 0 refills | Status: DC

## 2018-05-22 MED ORDER — VITAMIN D (ERGOCALCIFEROL) 1.25 MG (50000 UNIT) PO CAPS: 50000.0000 [IU] | ORAL_CAPSULE | ORAL | 0 refills | Status: DC

## 2018-05-22 NOTE — Progress Notes (Signed)
Office: [phone]  /  Fax: [phone] TeleHealth Visit:  Selena PonderMichelle Lynn Flores has verbally consented to this TeleHealth visit today. The patient is located at home, the provider is located at the [facility] office. The participants in this visit include the listed provider and patient. The visit was conducted today via Face Time.  HPI:   Chief Complaint: OBESITY Selena Flores is here to discuss her progress with her obesity treatment plan. She is keeping a food journal with 1500 calories and 90 grams of protein and is following her eating plan approximately 50 % of the time. She states she is walking on the treadmill 30 to 60 minutes 2 to 4 times per week. Selena Flores feels that she is doing well with maintaining weight, but notes an increase in hunger in the last 1 to 2 weeks. She has increased exercise in the early evening at the same time, which is likely the cause. Selena Flores likes the journaling, but is not doing it every day.  We were unable to weigh the patient today for this TeleHealth visit. She feels as if she has maintained weight since her last visit. She has lost 39 lbs since starting treatment with us.  Insulin Resistance Selena Flores has a diagnosis of insulin resistance based on her elevated fasting insulin level >5. Although Selena Flores's blood glucose readings are still under good control, insulin resistance puts her at greater risk of metabolic syndrome and diabetes. She is stable on metformin currently and notes increased polyphagia with a rapid increase in exercise, but she denies hypoglycemia. Selena Flores continues to work on diet and exercise to decrease risk of diabetes.  Vitamin D Deficiency Selena Flores has a diagnosis of vitamin D deficiency. She is currently stable on vit D, but is not yet at goal. Selena Flores denies nausea, vomiting, or muscle weakness.  ASSESSMENT AND PLAN:  Insulin resistance - Plan: metFORMIN (GLUCOPHAGE) 500 MG tablet  Vitamin D deficiency - Plan:  Vitamin D, Ergocalciferol, (DRISDOL) 1.25 MG (50000 UT) CAPS capsule  Class 3 severe obesity with serious comorbidity and body mass index (BMI) of 40.0 to 44.9 in adult, unspecified obesity type (HCC)  PLAN:  Insulin Resistance Selena Flores will continue to work on weight loss, exercise, and decreasing simple carbohydrates in her diet to help decrease the risk of diabetes. She was informed that eating too many simple carbohydrates or too many calories at one sitting increases the likelihood of GI side effects. Selena Flores agreed to continue her diet, exercise, and taking metformin 500 mg qAM #30 with no refills and prescription was written today. Selena Flores agreed to follow up with us as directed to monitor her progress in 3 weeks.  Vitamin D Deficiency Selena Flores was informed that low vitamin D levels contribute to fatigue and are associated with obesity, breast, and colon cancer. Selena Flores agrees to continue to take prescription Vit D @50 ,000 IU every week #4 with no refills and will follow up for routine testing of vitamin D, at least 2-3 times per year. She was informed of the risk of over-replacement of vitamin D and agrees to not increase her dose unless she discusses this with us first. Selena Flores agrees to follow up in 3 weeks as directed.  Obesity Selena Flores is currently in the action stage of change. As such, her goal is to continue with weight loss efforts. She has agreed to keep a food journal with 1500 calories and 90 grams of protein.  Selena Flores has been instructed to change exercising to 30 minutes before 5:00pm for 5  times a weeks. We discussed the following Behavioral Modification Strategies today: increasing lean protein intake, better snacking choices, keep a strict food journal, and ways to avoid boredom eating.  Selena Flores has agreed to follow up with our clinic in 3 weeks. She was informed of the importance of frequent follow up visits to maximize her success with intensive lifestyle  modifications for her multiple health conditions.  ALLERGIES: Allergies  Allergen Reactions  . Ciprofloxacin Itching  . Rocephin [Ceftriaxone] Itching  . Penicillins Itching and Rash    Has patient had a PCN reaction causing immediate rash, facial/tongue/throat swelling, SOB or lightheadedness with hypotension: Unknown Has patient had a PCN reaction causing severe rash involving mucus membranes or skin necrosis: Unknown Has patient had a PCN reaction that required hospitalization Unknown Has patient had a PCN reaction occurring within the last 10 years: Yes If all of the above answers are "NO", then may proceed with Cephalosporin use.     MEDICATIONS: Current Outpatient Medications on File Prior to Visit  Medication Sig Dispense Refill  . aspirin-acetaminophen-caffeine (EXCEDRIN MIGRAINE) 250-250-65 MG tablet Take 1 tablet by mouth every 6 (six) hours as needed for headache.    . calcium carbonate (TUMS - DOSED IN MG ELEMENTAL CALCIUM) 500 MG chewable tablet Chew 1 tablet by mouth daily as needed for indigestion or heartburn.    . meloxicam (MOBIC) 15 MG tablet Take 1 tablet (15 mg total) by mouth daily as needed for pain. 30 tablet 1  . Norgestimate-Ethinyl Estradiol Triphasic 0.18/0.215/0.25 MG-35 MCG tablet Take 1 tablet by mouth daily.    Marland Kitchen omeprazole (PRILOSEC) 20 MG capsule Take 20 mg by mouth daily as needed.    . sertraline (ZOLOFT) 100 MG tablet Take 1 tablet (100 mg total) by mouth daily. 30 tablet 3   No current facility-administered medications on file prior to visit.     PAST MEDICAL HISTORY: Past Medical History:  Diagnosis Date  . Anxiety   . Anxiety disorder 02/17/2015  . Chronic kidney disease    kidney infection 2008 ish  . Constipation   . Esophageal reflux 02/17/2015  . Food allergy    onions itching  . GERD (gastroesophageal reflux disease)   . Hyperlipidemia   . Intermittent palpitations 02/25/2018  . Melasma 02/17/2015  . Migraines   . Morbid obesity  (HCC) 07/10/2017  . Morbid obesity with BMI of 45.0-49.9, adult (HCC) 02/17/2015  . 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 Plantar fasciitis 11/28/2017  . Preventative health care 10/15/2017   GYN: Dr Tami Ribas [facility]  . 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  . Vitamin D deficiency 11/28/2017    PAST SURGICAL HISTORY: Past Surgical History:  Procedure Laterality Date  . CERVICAL CONE BIOPSY    . TONSILLECTOMY      SOCIAL HISTORY: Social History   Tobacco Use  . Smoking status: Never Smoker  . Smokeless tobacco: Never Used  . Tobacco comment: smoked for 1 yr socially in college  Substance Use Topics  . Alcohol use: Yes    Comment: social-occ  . Drug use: Not Currently    FAMILY HISTORY: Family History  Problem Relation Age of Onset  . Heart disease Mother        chf  . Obesity Mother   . Anxiety disorder Mother   . Heart failure Mother   . Skin cancer Father   . Cancer Father        skin cancer  .  Heart disease Maternal Grandmother   . Heart disease Maternal Uncle     ROS: Review of Systems  Gastrointestinal: Negative for nausea and vomiting.  Musculoskeletal:       Negative for muscle weakness.  Endo/Heme/Allergies:       Negative for hypoglycemia. Positive for polyphagia.    PHYSICAL EXAM: Pt in no acute distress  RECENT LABS AND TESTS: BMET    Component Value Date/Time   NA 139 01/23/2018 0815   K 4.8 01/23/2018 0815   CL 102 01/23/2018 0815   CO2 22 01/23/2018 0815   GLUCOSE 89 01/23/2018 0815   GLUCOSE 106 (H) 04/06/2015 1136   BUN 14 01/23/2018 0815   CREATININE 0.87 01/23/2018 0815   CALCIUM 9.3 01/23/2018 0815   GFRNONAA 87 01/23/2018 0815   GFRAA 100 01/23/2018 0815   Lab Results  Component Value Date   HGBA1C 5.4 01/23/2018   HGBA1C 5.4 10/11/2017   Lab Results  Component Value Date   INSULIN 7.4 01/23/2018   INSULIN 10.3 10/11/2017   CBC     Component Value Date/Time   WBC 6.6 10/11/2017 0849   WBC 10.5 04/06/2015 1136   RBC 4.85 10/11/2017 0849   RBC 5.07 04/06/2015 1136   HGB 14.1 10/11/2017 0849   HCT 42.4 10/11/2017 0849   PLT 285 04/06/2015 1136   MCV 87 10/11/2017 0849   MCH 29.1 10/11/2017 0849   MCH 29.4 04/06/2015 1136   MCHC 33.3 10/11/2017 0849   MCHC 32.4 04/06/2015 1136   RDW 13.1 10/11/2017 0849   LYMPHSABS 1.7 10/11/2017 0849   MONOABS 1.1 (H) 02/28/2007 0247   EOSABS 0.0 10/11/2017 0849   BASOSABS 0.0 10/11/2017 0849   Iron/TIBC/Ferritin/ %Sat No results found for: IRON, TIBC, FERRITIN, IRONPCTSAT Lipid Panel     Component Value Date/Time   CHOL 250 (H) 01/23/2018 0815   TRIG 101 01/23/2018 0815   HDL 72 01/23/2018 0815   LDLCALC 158 (H) 01/23/2018 0815   Hepatic Function Panel     Component Value Date/Time   PROT 7.0 01/23/2018 0815   ALBUMIN 3.8 01/23/2018 0815   AST 21 01/23/2018 0815   ALT 27 01/23/2018 0815   ALKPHOS 128 (H) 01/23/2018 0815   BILITOT 0.2 01/23/2018 0815      Component Value Date/Time   TSH 2.950 10/11/2017 0849    Results for CORAYMA, MCCROSSEN (MRN [id number]) as of 05/22/2018 12:21  Ref. Range 01/23/2018 08:15  Vitamin D, 25-Hydroxy Latest Ref Range: 30.0 - 100.0 ng/mL 35.9    I, Kirke Corin, CMA, am acting as transcriptionist for Wilder Glade, MD I have reviewed the above documentation for accuracy and completeness, and I agree with the above. -Quillian Quince, MD

## 2018-05-26 ENCOUNTER — Other Ambulatory Visit: Payer: Self-pay

## 2018-05-26 ENCOUNTER — Telehealth: Payer: BLUE CROSS/BLUE SHIELD | Admitting: Family Medicine

## 2018-05-26 MED ORDER — SERTRALINE HCL 100 MG PO TABS: ORAL_TABLET | ORAL | 1 refills | Status: DC

## 2018-06-02 ENCOUNTER — Ambulatory Visit (INDEPENDENT_AMBULATORY_CARE_PROVIDER_SITE_OTHER): Payer: BLUE CROSS/BLUE SHIELD | Admitting: Psychology

## 2018-06-02 DIAGNOSIS — F411 Generalized anxiety disorder: Secondary | ICD-10-CM

## 2018-06-12 ENCOUNTER — Encounter (INDEPENDENT_AMBULATORY_CARE_PROVIDER_SITE_OTHER): Payer: Self-pay | Admitting: Family Medicine

## 2018-06-12 ENCOUNTER — Other Ambulatory Visit: Payer: Self-pay

## 2018-06-12 ENCOUNTER — Ambulatory Visit (INDEPENDENT_AMBULATORY_CARE_PROVIDER_SITE_OTHER): Payer: BLUE CROSS/BLUE SHIELD | Admitting: Family Medicine

## 2018-06-12 DIAGNOSIS — Z6841 Body Mass Index (BMI) 40.0 and over, adult: Secondary | ICD-10-CM | POA: Diagnosis not present

## 2018-06-12 DIAGNOSIS — E8881 Metabolic syndrome: Secondary | ICD-10-CM | POA: Diagnosis not present

## 2018-06-12 DIAGNOSIS — F418 Other specified anxiety disorders: Secondary | ICD-10-CM | POA: Diagnosis not present

## 2018-06-12 NOTE — Progress Notes (Signed)
Office: [phone]  /  Fax: [phone] TeleHealth Visit:  Selena PonderMichelle Lynn Flores has verbally consented to this TeleHealth visit today. The patient is located at home, the provider is located at the [facility] office. The participants in this visit include the listed provider and patient. The visit was conducted today via Face Time.  HPI:   Chief Complaint: OBESITY [name] is here to discuss her progress with her obesity treatment plan. She is keeping a food journal with 1500 calories and 90 grams of protein and is following her eating plan approximately 30 % of the time. She states she is walking on the treadmill for 30 minutes 2 times per week. [name] is doing well maintaining her weight, but has been struggling more with emotional eating in the last 1 to 2 weeks. She states that she is ready to get back on track.  We were unable to weigh the patient today for this TeleHealth visit. She is unsure if she has lost weight since her last visit. She has lost 39 lbs since starting treatment with us.  Insulin Resistance [name] has a diagnosis of insulin resistance based on her elevated fasting insulin level >5. Although Kamori's blood glucose readings are still under good control, insulin resistance puts her at greater risk of metabolic syndrome and diabetes. She is stable on metformin, but she is struggling more with her diet due to emotional eating. She continues to work on diet and exercise to decrease risk of diabetes. [name] denies polyphagia, nausea, vomiting, or hypoglycemia.  Depression with Anxiety [name] is on Zoloft which was already increased by Dr. Abner GreenspanBlyth. She is now seeing a therapist and feels her mood has improved. She had increased emotional eating and using food for comfort to the extent that it is negatively impacting her health, but feels more in control now. She has been working on behavior modification techniques to help reduce her emotional eating and  has been somewhat successful.  [facility] 02/03/2018 12/04/2017 11/11/2017 10/24/2017 10/10/2017  Decreased Interest 0 0 0 0 2  Down, Depressed, Hopeless 1 0 1 1 1   PHQ - 2 Score 1 0 1 1 3   Altered sleeping 1 0 0 1 0  Tired, decreased energy 1 0 1 1 2   Change in appetite 0 0 0 0 1  Feeling bad or failure about yourself  0 0 0 1 0  Trouble concentrating 0 1 0 0 0  Moving slowly or fidgety/restless 0 0 0 0 1  Suicidal thoughts 0 0 0 0 0  PHQ-9 Score 3 1 2 4 7   Difficult doing work/chores - - - - Not difficult at all   ASSESSMENT AND PLAN:  Insulin resistance  Depression with anxiety  Class 3 severe obesity with serious comorbidity and body mass index (BMI) of 40.0 to 44.9 in adult, unspecified obesity type (HCC)  PLAN:  Insulin Resistance [name] will continue to work on weight loss, exercise, and decreasing simple carbohydrates in her diet to help decrease the risk of diabetes. She was informed that eating too many simple carbohydrates or too many calories at one sitting increases the likelihood of GI side effects. [name] agreed to continue metformin and get back to her diet plan. [name] agreed to follow up with us as directed to monitor her progress in 3 weeks.  Depression with Anxiety We discussed behavior modification techniques today to help [name] deal with her emotional eating and depression. She has agreed to continue to take Zoloft  and we discussed emotional eating strategies. Encouragement and support was given and she agreed to follow up as directed in 3 weeks.  I spent > than 50% of the 25 minute visit on counseling as documented in the note.  Obesity Selena Flores is currently in the action stage of change. As such, her goal is to continue with weight loss efforts. She has agreed to keep a food journal with 1500 calories and 90+ grams of protein.  Selena Flores has been instructed to work up to a goal of 150 minutes of combined cardio and strengthening  exercise per week for weight loss and overall health benefits. We discussed the following Behavioral Modification Strategies today: emotional eating strategies and ways to avoid boredom eating.  Bryauna has agreed to follow up with our clinic in 3 weeks. She was informed of the importance of frequent follow up visits to maximize her success with intensive lifestyle modifications for her multiple health conditions.  ALLERGIES: Allergies  Allergen Reactions  . Ciprofloxacin Itching  . Rocephin [Ceftriaxone] Itching  . Penicillins Itching and Rash    Has patient had a PCN reaction causing immediate rash, facial/tongue/throat swelling, SOB or lightheadedness with hypotension: Unknown Has patient had a PCN reaction causing severe rash involving mucus membranes or skin necrosis: Unknown Has patient had a PCN reaction that required hospitalization Unknown Has patient had a PCN reaction occurring within the last 10 years: Yes If all of the above answers are "NO", then may proceed with Cephalosporin use.     MEDICATIONS: Current Outpatient Medications on File Prior to Visit  Medication Sig Dispense Refill  . aspirin-acetaminophen-caffeine (EXCEDRIN MIGRAINE) 250-250-65 MG tablet Take 1 tablet by mouth every 6 (six) hours as needed for headache.    . calcium carbonate (TUMS - DOSED IN MG ELEMENTAL CALCIUM) 500 MG chewable tablet Chew 1 tablet by mouth daily as needed for indigestion or heartburn.    . meloxicam (MOBIC) 15 MG tablet Take 1 tablet (15 mg total) by mouth daily as needed for pain. 30 tablet 1  . metFORMIN (GLUCOPHAGE) 500 MG tablet Take 1 tablet (500 mg total) by mouth daily with breakfast. 30 tablet 0  . Norgestimate-Ethinyl Estradiol Triphasic 0.18/0.215/0.25 MG-35 MCG tablet Take 1 tablet by mouth daily.    Marland Kitchen omeprazole (PRILOSEC) 20 MG capsule Take 20 mg by mouth daily as needed.    . sertraline (ZOLOFT) 100 MG tablet 50 mg in am and 100 mg in pm 125 tablet 1  . Vitamin D,  Ergocalciferol, (DRISDOL) 1.25 MG (50000 UT) CAPS capsule Take 1 capsule (50,000 Units total) by mouth every 7 (seven) days. 4 capsule 0   No current facility-administered medications on file prior to visit.     PAST MEDICAL HISTORY: Past Medical History:  Diagnosis Date  . Anxiety   . Anxiety disorder 02/17/2015  . Chronic kidney disease    kidney infection 2008 ish  . Constipation   . Esophageal reflux 02/17/2015  . Food allergy    onions itching  . GERD (gastroesophageal reflux disease)   . Hyperlipidemia   . Intermittent palpitations 02/25/2018  . Melasma 02/17/2015  . Migraines   . Morbid obesity (HCC) 07/10/2017  . Morbid obesity with BMI of 45.0-49.9, adult (HCC) 02/17/2015  . 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 Plantar fasciitis 11/28/2017  . Preventative health care 10/15/2017   GYN: Dr Tami Ribas OB/GY[facility]  . 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  . Vitamin D deficiency 11/28/2017  PAST SURGICAL HISTORY: Past Surgical History:  Procedure Laterality Date  . CERVICAL CONE BIOPSY    . TONSILLECTOMY      SOCIAL HISTORY: Social History   Tobacco Use  . Smoking status: Never Smoker  . Smokeless tobacco: Never Used  . Tobacco comment: smoked for 1 yr socially in college  Substance Use Topics  . Alcohol use: Yes    Comment: social-occ  . Drug use: Not Currently    FAMILY HISTORY: Family History  Problem Relation Age of Onset  . Heart disease Mother        chf  . Obesity Mother   . Anxiety disorder Mother   . Heart failure Mother   . Skin cancer Father   . Cancer Father        skin cancer  . Heart disease Maternal Grandmother   . Heart disease Maternal Uncle     ROS: Review of Systems  Gastrointestinal: Negative for nausea and vomiting.  Endo/Heme/Allergies:       Negative for hypoglycemia. Negative for polyphagia.  Psychiatric/Behavioral: Positive for depression. The  patient is nervous/anxious.     PHYSICAL EXAM: Pt in no acute distress  RECENT LABS AND TESTS: BMET    Component Value Date/Time   NA 139 01/23/2018 0815   K 4.8 01/23/2018 0815   CL 102 01/23/2018 0815   CO2 22 01/23/2018 0815   GLUCOSE 89 01/23/2018 0815   GLUCOSE 106 (H) 04/06/2015 1136   BUN 14 01/23/2018 0815   CREATININE 0.87 01/23/2018 0815   CALCIUM 9.3 01/23/2018 0815   GFRNONAA 87 01/23/2018 0815   GFRAA 100 01/23/2018 0815   Lab Results  Component Value Date   HGBA1C 5.4 01/23/2018   HGBA1C 5.4 10/11/2017   Lab Results  Component Value Date   INSULIN 7.4 01/23/2018   INSULIN 10.3 10/11/2017   CBC    Component Value Date/Time   WBC 6.6 10/11/2017 0849   WBC 10.5 04/06/2015 1136   RBC 4.85 10/11/2017 0849   RBC 5.07 04/06/2015 1136   HGB 14.1 10/11/2017 0849   HCT 42.4 10/11/2017 0849   PLT 285 04/06/2015 1136   MCV 87 10/11/2017 0849   MCH 29.1 10/11/2017 0849   MCH 29.4 04/06/2015 1136   MCHC 33.3 10/11/2017 0849   MCHC 32.4 04/06/2015 1136   RDW 13.1 10/11/2017 0849   LYMPHSABS 1.7 10/11/2017 0849   MONOABS 1.1 (H) 02/28/2007 0247   EOSABS 0.0 10/11/2017 0849   BASOSABS 0.0 10/11/2017 0849   Iron/TIBC/Ferritin/ %Sat No results found for: IRON, TIBC, FERRITIN, IRONPCTSAT Lipid Panel     Component Value Date/Time   CHOL 250 (H) 01/23/2018 0815   TRIG 101 01/23/2018 0815   HDL 72 01/23/2018 0815   LDLCALC 158 (H) 01/23/2018 0815   Hepatic Function Panel     Component Value Date/Time   PROT 7.0 01/23/2018 0815   ALBUMIN 3.8 01/23/2018 0815   AST 21 01/23/2018 0815   ALT 27 01/23/2018 0815   ALKPHOS 128 (H) 01/23/2018 0815   BILITOT 0.2 01/23/2018 0815      Component Value Date/Time   TSH 2.950 10/11/2017 0849   Results for NATILE, SELINSKY (MRN [id number]) as of 06/12/2018 14:31  Ref. Range 01/23/2018 08:15  Vitamin D, 25-Hydroxy Latest Ref Range: 30.0 - 100.0 ng/mL 35.9     I, Kirke Corin, CMA, am acting as transcriptionist  for Wilder Glade, MD I have reviewed the above documentation for accuracy and completeness, and I agree with  the above. -Dennard Nip, MD

## 2018-06-17 ENCOUNTER — Ambulatory Visit (INDEPENDENT_AMBULATORY_CARE_PROVIDER_SITE_OTHER): Payer: BLUE CROSS/BLUE SHIELD | Admitting: Psychology

## 2018-06-17 DIAGNOSIS — F411 Generalized anxiety disorder: Secondary | ICD-10-CM | POA: Diagnosis not present

## 2018-06-19 ENCOUNTER — Other Ambulatory Visit: Payer: Self-pay | Admitting: Family Medicine

## 2018-07-03 ENCOUNTER — Ambulatory Visit (INDEPENDENT_AMBULATORY_CARE_PROVIDER_SITE_OTHER): Payer: BC Managed Care – PPO | Admitting: Family Medicine

## 2018-07-03 ENCOUNTER — Other Ambulatory Visit: Payer: Self-pay

## 2018-07-03 ENCOUNTER — Encounter (INDEPENDENT_AMBULATORY_CARE_PROVIDER_SITE_OTHER): Payer: Self-pay | Admitting: Family Medicine

## 2018-07-03 ENCOUNTER — Ambulatory Visit (INDEPENDENT_AMBULATORY_CARE_PROVIDER_SITE_OTHER): Payer: BC Managed Care – PPO | Admitting: Psychology

## 2018-07-03 DIAGNOSIS — Z6841 Body Mass Index (BMI) 40.0 and over, adult: Secondary | ICD-10-CM

## 2018-07-03 DIAGNOSIS — E8881 Metabolic syndrome: Secondary | ICD-10-CM | POA: Diagnosis not present

## 2018-07-03 DIAGNOSIS — F411 Generalized anxiety disorder: Secondary | ICD-10-CM | POA: Diagnosis not present

## 2018-07-03 DIAGNOSIS — E559 Vitamin D deficiency, unspecified: Secondary | ICD-10-CM | POA: Diagnosis not present

## 2018-07-03 MED ORDER — METFORMIN HCL 500 MG PO TABS: 500.0000 mg | ORAL_TABLET | Freq: Every day | ORAL | 0 refills | Status: DC

## 2018-07-03 MED ORDER — VITAMIN D (ERGOCALCIFEROL) 1.25 MG (50000 UNIT) PO CAPS: 50000.0000 [IU] | ORAL_CAPSULE | ORAL | 0 refills | Status: DC

## 2018-07-07 NOTE — Progress Notes (Signed)
Office: [phone]  /  Fax: [phone] TeleHealth Visit:  Selena PonderMichelle Lynn Flores has verbally consented to this TeleHealth visit today. The patient is located at home, the provider is located at the [facility] office. The participants in this visit include the listed provider and patient and any and all parties involved. The visit was conducted today via FaceTime.  HPI:   Chief Complaint: OBESITY Selena DusterMichelle is here to discuss her progress with her obesity treatment plan. She is on the keep a food journal with 1500 calories and 90 grams of protein daily plan and is following her eating plan approximately 30 % of the time. She states she is exercising 0 minutes 0 times per week. Selena DusterMichelle continues to maintain her weight well. She often journals in the morning, but not at dinner, which tends to be higher calories. Her husband does the cooking, but he hasn't been especially supportive of her weight loss efforts. We were unable to weigh the patient today for this TeleHealth visit. She feels as if she has lost weight since her last visit. She has lost 41 lbs since starting treatment with us.  Vitamin D deficiency Selena DusterMichelle has a diagnosis of vitamin D deficiency. Her last vitamin D level was not at goal. She is due for labs. She is currently taking vit D and denies nausea, vomiting or muscle weakness.  Insulin Resistance Selena DusterMichelle has a diagnosis of insulin resistance based on her elevated fasting insulin level >5. Although Lamonica's blood glucose readings are still under good control, insulin resistance puts her at greater risk of metabolic syndrome and diabetes. Her last A1c was well controlled on Metformin and diet. She is due for labs. Selena DusterMichelle continues to work on diet and exercise to decrease risk of diabetes. Selena DusterMichelle denies nausea, vomiting or hypoglycemia.  ASSESSMENT AND PLAN:  Vitamin D deficiency - Plan: Vitamin D, Ergocalciferol, (DRISDOL) 1.25 MG (50000 UT) CAPS capsule   Insulin resistance - Plan: metFORMIN (GLUCOPHAGE) 500 MG tablet  Class 3 severe obesity with serious comorbidity and body mass index (BMI) of 40.0 to 44.9 in adult, unspecified obesity type (HCC)  PLAN:  Vitamin D Deficiency Selena DusterMichelle was informed that low vitamin D levels contributes to fatigue and are associated with obesity, breast, and colon cancer. She agrees to continue to take prescription Vit D @50 ,000 IU every week #4 with no refills and will follow up for routine testing of vitamin D, at least 2-3 times per year. She was informed of the risk of over-replacement of vitamin D and agrees to not increase her dose unless she discusses this with us first. We will check labs as soon as patient can be seen in the office. Selena DusterMichelle agrees to follow up with our clinic in 2 weeks.  Insulin Resistance Selena DusterMichelle will continue to work on weight loss, exercise, and decreasing simple carbohydrates in her diet to help decrease the risk of diabetes. We dicussed metformin including benefits and risks. She was informed that eating too many simple carbohydrates or too many calories at one sitting increases the likelihood of GI side effects. Selena DusterMichelle agrees to continue metformin 500 mg daily with breakfast #30 with no refills and follow up with us as directed to monitor her progress. We will check labs as soon as patient can be seen in the office.  Obesity Selena DusterMichelle is currently in the action stage of change. As such, her goal is to continue with weight loss efforts She has agreed to keep a food journal with 1500 calories  and 90 grams of protein daily Selena DusterMichelle has been instructed to work up to a goal of 150 minutes of combined cardio and strengthening exercise per week for weight loss and overall health benefits. We discussed the following Behavioral Modification Strategies today: increasing lean protein intake, work on meal planning and easy cooking plans and dealing with family or coworker sabotage  Selena DusterMichelle  has agreed to follow up with our clinic in 2 weeks. She was informed of the importance of frequent follow up visits to maximize her success with intensive lifestyle modifications for her multiple health conditions.  ALLERGIES: Allergies  Allergen Reactions  . Ciprofloxacin Itching  . Rocephin [Ceftriaxone] Itching  . Penicillins Itching and Rash    Has patient had a PCN reaction causing immediate rash, facial/tongue/throat swelling, SOB or lightheadedness with hypotension: Unknown Has patient had a PCN reaction causing severe rash involving mucus membranes or skin necrosis: Unknown Has patient had a PCN reaction that required hospitalization Unknown Has patient had a PCN reaction occurring within the last 10 years: Yes If all of the above answers are "NO", then may proceed with Cephalosporin use.     MEDICATIONS: Current Outpatient Medications on File Prior to Visit  Medication Sig Dispense Refill  . aspirin-acetaminophen-caffeine (EXCEDRIN MIGRAINE) 250-250-65 MG tablet Take 1 tablet by mouth every 6 (six) hours as needed for headache.    . calcium carbonate (TUMS - DOSED IN MG ELEMENTAL CALCIUM) 500 MG chewable tablet Chew 1 tablet by mouth daily as needed for indigestion or heartburn.    . meloxicam (MOBIC) 15 MG tablet Take 1 tablet (15 mg total) by mouth daily as needed for pain. 30 tablet 1  . Norgestimate-Ethinyl Estradiol Triphasic 0.18/0.215/0.25 MG-35 MCG tablet Take 1 tablet by mouth daily.    Marland Kitchen. omeprazole (PRILOSEC) 20 MG capsule Take 20 mg by mouth daily as needed.    . sertraline (ZOLOFT) 100 MG tablet TAKE 1 TABLET BY MOUTH EVERY DAY 30 tablet 3   No current facility-administered medications on file prior to visit.     PAST MEDICAL HISTORY: Past Medical History:  Diagnosis Date  . Anxiety   . Anxiety disorder 02/17/2015  . Chronic kidney disease    kidney infection 2008 ish  . Constipation   . Esophageal reflux 02/17/2015  . Food allergy    onions itching  . GERD  (gastroesophageal reflux disease)   . Hyperlipidemia   . Intermittent palpitations 02/25/2018  . Melasma 02/17/2015  . Migraines   . Morbid obesity (HCC) 07/10/2017  . Morbid obesity with BMI of 45.0-49.9, adult (HCC) 02/17/2015  . 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 Plantar fasciitis 11/28/2017  . Preventative health care 10/15/2017   GYN: Dr Tami Ribasallahan [facility]  . 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  . Vitamin D deficiency 11/28/2017    PAST SURGICAL HISTORY: Past Surgical History:  Procedure Laterality Date  . CERVICAL CONE BIOPSY    . TONSILLECTOMY      SOCIAL HISTORY: Social History   Tobacco Use  . Smoking status: Never Smoker  . Smokeless tobacco: Never Used  . Tobacco comment: smoked for 1 yr socially in college  Substance Use Topics  . Alcohol use: Yes    Comment: social-occ  . Drug use: Not Currently    FAMILY HISTORY: Family History  Problem Relation Age of Onset  . Heart disease Mother        chf  . Obesity Mother   . Anxiety  disorder Mother   . Heart failure Mother   . Skin cancer Father   . Cancer Father        skin cancer  . Heart disease Maternal Grandmother   . Heart disease Maternal Uncle     ROS: Review of Systems  Constitutional: Positive for weight loss.  Gastrointestinal: Negative for nausea and vomiting.  Musculoskeletal:       Negative for muscle weakness  Endo/Heme/Allergies:       Negative for hypoglycemia    PHYSICAL EXAM: Pt in no acute distress  RECENT LABS AND TESTS: BMET    Component Value Date/Time   NA 139 01/23/2018 0815   K 4.8 01/23/2018 0815   CL 102 01/23/2018 0815   CO2 22 01/23/2018 0815   GLUCOSE 89 01/23/2018 0815   GLUCOSE 106 (H) 04/06/2015 1136   BUN 14 01/23/2018 0815   CREATININE 0.87 01/23/2018 0815   CALCIUM 9.3 01/23/2018 0815   GFRNONAA 87 01/23/2018 0815   GFRAA 100 01/23/2018 0815   Lab Results  Component  Value Date   HGBA1C 5.4 01/23/2018   HGBA1C 5.4 10/11/2017   Lab Results  Component Value Date   INSULIN 7.4 01/23/2018   INSULIN 10.3 10/11/2017   CBC    Component Value Date/Time   WBC 6.6 10/11/2017 0849   WBC 10.5 04/06/2015 1136   RBC 4.85 10/11/2017 0849   RBC 5.07 04/06/2015 1136   HGB 14.1 10/11/2017 0849   HCT 42.4 10/11/2017 0849   PLT 285 04/06/2015 1136   MCV 87 10/11/2017 0849   MCH 29.1 10/11/2017 0849   MCH 29.4 04/06/2015 1136   MCHC 33.3 10/11/2017 0849   MCHC 32.4 04/06/2015 1136   RDW 13.1 10/11/2017 0849   LYMPHSABS 1.7 10/11/2017 0849   MONOABS 1.1 (H) 02/28/2007 0247   EOSABS 0.0 10/11/2017 0849   BASOSABS 0.0 10/11/2017 0849   Iron/TIBC/Ferritin/ %Sat No results found for: IRON, TIBC, FERRITIN, IRONPCTSAT Lipid Panel     Component Value Date/Time   CHOL 250 (H) 01/23/2018 0815   TRIG 101 01/23/2018 0815   HDL 72 01/23/2018 0815   LDLCALC 158 (H) 01/23/2018 0815   Hepatic Function Panel     Component Value Date/Time   PROT 7.0 01/23/2018 0815   ALBUMIN 3.8 01/23/2018 0815   AST 21 01/23/2018 0815   ALT 27 01/23/2018 0815   ALKPHOS 128 (H) 01/23/2018 0815   BILITOT 0.2 01/23/2018 0815      Component Value Date/Time   TSH 2.950 10/11/2017 0849     Ref. Range 01/23/2018 08:15  Vitamin D, 25-Hydroxy Latest Ref Range: 30.0 - 100.0 ng/mL 35.9    I, Doreene Nest, am acting as Location manager for Dennard Nip, MD I have reviewed the above documentation for accuracy and completeness, and I agree with the above. -Dennard Nip, MD

## 2018-07-17 ENCOUNTER — Telehealth (INDEPENDENT_AMBULATORY_CARE_PROVIDER_SITE_OTHER): Payer: BC Managed Care – PPO | Admitting: Family Medicine

## 2018-07-17 ENCOUNTER — Ambulatory Visit (INDEPENDENT_AMBULATORY_CARE_PROVIDER_SITE_OTHER): Payer: BC Managed Care – PPO | Admitting: Psychology

## 2018-07-17 ENCOUNTER — Encounter (INDEPENDENT_AMBULATORY_CARE_PROVIDER_SITE_OTHER): Payer: Self-pay | Admitting: Family Medicine

## 2018-07-17 ENCOUNTER — Other Ambulatory Visit: Payer: Self-pay

## 2018-07-17 DIAGNOSIS — Z6841 Body Mass Index (BMI) 40.0 and over, adult: Secondary | ICD-10-CM

## 2018-07-17 DIAGNOSIS — F418 Other specified anxiety disorders: Secondary | ICD-10-CM | POA: Diagnosis not present

## 2018-07-17 DIAGNOSIS — F411 Generalized anxiety disorder: Secondary | ICD-10-CM | POA: Diagnosis not present

## 2018-07-17 DIAGNOSIS — R7303 Prediabetes: Secondary | ICD-10-CM | POA: Diagnosis not present

## 2018-07-21 NOTE — Progress Notes (Signed)
Office: [phone]  /  Fax: [phone] TeleHealth Visit:  Selena Flores has verbally consented to this TeleHealth visit today. The patient is located at home, the provider is located at the [facility] office. The participants in this visit include the listed provider and patient. The visit was conducted today via telephone call (FaceTime failed - changed to telephone call).  HPI:   Chief Complaint: OBESITY Selena Flores is here to discuss her progress with her obesity treatment plan. She is keeping a food journal with 1500 calories and 90 grams of protein and is following her eating plan approximately 50% of the time. She states she is exercising 0 minutes 0 times per week. Selena Flores continues to do well with journaling and eating plan and feels she has lost another 2 lbs since our last visit. She states her hunger is controlled and she is doing well with journaling. We were unable to weigh the patient today for this TeleHealth visit. She feels as if she has lost 2 lbs since her last visit. She has lost 39 lbs since starting treatment with [place].  Depression with Anxiety Selena Flores restarted Zoloft and feels her mood is better and fatigue has improved. She shows no sign of suicidal or homicidal ideations. Selena Flores states she feels more in control of her emotional eating.  Depression screen Selena Flores 2/9 02/03/2018 12/04/2017 11/11/2017 10/24/2017 10/10/2017  Decreased Interest 0 0 0 0 2  Down, Depressed, Hopeless 1 0 1 1 1   PHQ - 2 Score 1 0 1 1 3   Altered sleeping 1 0 0 1 0  Tired, decreased energy 1 0 1 1 2   Change in appetite 0 0 0 0 1  Feeling bad or failure about yourself  0 0 0 1 0  Trouble concentrating 0 1 0 0 0  Moving slowly or fidgety/restless 0 0 0 0 1  Suicidal thoughts 0 0 0 0 0  PHQ-9 Score 3 1 2 4 7   Difficult doing work/chores - - - - Not difficult at all   Pre-Diabetes Selena Flores has a diagnosis of prediabetes based on her elevated Hgb A1c and was informed this  puts her at greater risk of developing diabetes. She is stable on metformin currently and notes decreased polyphagia while taking this. She continues to work on diet and exercise to decrease risk of diabetes. She denies nausea or vomiting.  ASSESSMENT AND PLAN:  Depression with anxiety  Prediabetes  Class 3 severe obesity with serious comorbidity and body mass index (BMI) of 40.0 to 44.9 in adult, unspecified obesity type (Selena Flores)  PLAN:  Depression with Anxiety We discussed behavior modification techniques today to help Selena Flores deal with her emotional eating and depression. [name] will continue Zoloft and therapy. We discussed emotional eating strategies.  Pre-Diabetes Selena Flores will continue to work on weight loss, exercise, and decreasing simple carbohydrates in her diet to help decrease the risk of diabetes. We dicussed metformin including benefits and risks. She was informed that eating too many simple carbohydrates or too many calories at one sitting increases the likelihood of GI side effects. Akeela will continue metformin and diet. She will follow-up with [place] as directed to monitor her progress. We will check labs in 1 month.  I spent > than 50% of the 25 minute visit on counseling as documented in the note.  Obesity Selena Flores is currently in the action stage of change. As such, her goal is to continue with weight loss efforts. She has agreed to keep a food  journal with 1500 calories and 80 grams of protein.  Selena Flores has been instructed to work up to a goal of 150 minutes of combined cardio and strengthening exercise per week for weight loss and overall health benefits. We discussed the following Behavioral Modification Strategies today: work on meal planning, easy cooking plans, ways to avoid boredom eating, and keep a strict food journal.  Selena Flores has agreed to follow-up with our clinic in 3 weeks. She was informed of the importance of frequent follow-up visits to maximize her  success with intensive lifestyle modifications for her multiple health conditions.  ALLERGIES: Allergies  Allergen Reactions  . Ciprofloxacin Itching  . Rocephin [Ceftriaxone] Itching  . Penicillins Itching and Rash    Has patient had a PCN reaction causing immediate rash, facial/tongue/throat swelling, SOB or lightheadedness with hypotension: Unknown Has patient had a PCN reaction causing severe rash involving mucus membranes or skin necrosis: Unknown Has patient had a PCN reaction that required hospitalization Unknown Has patient had a PCN reaction occurring within the last 10 years: Yes If all of the above answers are "NO", then may proceed with Cephalosporin use.     MEDICATIONS: Current Outpatient Medications on File Prior to Visit  Medication Sig Dispense Refill  . aspirin-acetaminophen-caffeine (EXCEDRIN MIGRAINE) 250-250-65 MG tablet Take 1 tablet by mouth every 6 (six) hours as needed for headache.    . calcium carbonate (TUMS - DOSED IN MG ELEMENTAL CALCIUM) 500 MG chewable tablet Chew 1 tablet by mouth daily as needed for indigestion or heartburn.    . meloxicam (MOBIC) 15 MG tablet Take 1 tablet (15 mg total) by mouth daily as needed for pain. 30 tablet 1  . metFORMIN (GLUCOPHAGE) 500 MG tablet Take 1 tablet (500 mg total) by mouth daily with breakfast. 30 tablet 0  . Norgestimate-Ethinyl Estradiol Triphasic 0.18/0.215/0.25 MG-35 MCG tablet Take 1 tablet by mouth daily.    Marland Kitchen. omeprazole (PRILOSEC) 20 MG capsule Take 20 mg by mouth daily as needed.    . sertraline (ZOLOFT) 100 MG tablet TAKE 1 TABLET BY MOUTH EVERY DAY 30 tablet 3  . Vitamin D, Ergocalciferol, (DRISDOL) 1.25 MG (50000 UT) CAPS capsule Take 1 capsule (50,000 Units total) by mouth every 7 (seven) days. 4 capsule 0   No current facility-administered medications on file prior to visit.     PAST MEDICAL HISTORY: Past Medical History:  Diagnosis Date  . Anxiety   . Anxiety disorder 02/17/2015  . Chronic kidney  disease    kidney infection 2008 ish  . Constipation   . Esophageal reflux 02/17/2015  . Food allergy    onions itching  . GERD (gastroesophageal reflux disease)   . Hyperlipidemia   . Intermittent palpitations 02/25/2018  . Melasma 02/17/2015  . Migraines   . Morbid obesity (HCC) 07/10/2017  . Morbid obesity with BMI of 45.0-49.9, adult (HCC) 02/17/2015  . 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 Plantar fasciitis 11/28/2017  . Preventative health care 10/15/2017   GYN: Dr Tami Ribasallahan [facility]  . 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  . Vitamin D deficiency 11/28/2017    PAST SURGICAL HISTORY: Past Surgical History:  Procedure Laterality Date  . CERVICAL CONE BIOPSY    . TONSILLECTOMY      SOCIAL HISTORY: Social History   Tobacco Use  . Smoking status: Never Smoker  . Smokeless tobacco: Never Used  . Tobacco comment: smoked for 1 yr socially in college  Substance Use Topics  .  Alcohol use: Yes    Comment: social-occ  . Drug use: Not Currently    FAMILY HISTORY: Family History  Problem Relation Age of Onset  . Heart disease Mother        chf  . Obesity Mother   . Anxiety disorder Mother   . Heart failure Mother   . Skin cancer Father   . Cancer Father        skin cancer  . Heart disease Maternal Grandmother   . Heart disease Maternal Uncle    ROS: Review of Systems  Gastrointestinal: Negative for nausea and vomiting.  Psychiatric/Behavioral: Positive for depression. Negative for suicidal ideas. The patient is nervous/anxious.        Negative for homicidal ideas.   PHYSICAL EXAM: Pt in no acute distress  RECENT LABS AND TESTS: BMET    Component Value Date/Time   NA 139 01/23/2018 0815   K 4.8 01/23/2018 0815   CL 102 01/23/2018 0815   CO2 22 01/23/2018 0815   GLUCOSE 89 01/23/2018 0815   GLUCOSE 106 (H) 04/06/2015 1136   BUN 14 01/23/2018 0815   CREATININE 0.87 01/23/2018 0815    CALCIUM 9.3 01/23/2018 0815   GFRNONAA 87 01/23/2018 0815   GFRAA 100 01/23/2018 0815   Lab Results  Component Value Date   HGBA1C 5.4 01/23/2018   HGBA1C 5.4 10/11/2017   Lab Results  Component Value Date   INSULIN 7.4 01/23/2018   INSULIN 10.3 10/11/2017   CBC    Component Value Date/Time   WBC 6.6 10/11/2017 0849   WBC 10.5 04/06/2015 1136   RBC 4.85 10/11/2017 0849   RBC 5.07 04/06/2015 1136   HGB 14.1 10/11/2017 0849   HCT 42.4 10/11/2017 0849   PLT 285 04/06/2015 1136   MCV 87 10/11/2017 0849   MCH 29.1 10/11/2017 0849   MCH 29.4 04/06/2015 1136   MCHC 33.3 10/11/2017 0849   MCHC 32.4 04/06/2015 1136   RDW 13.1 10/11/2017 0849   LYMPHSABS 1.7 10/11/2017 0849   MONOABS 1.1 (H) 02/28/2007 0247   EOSABS 0.0 10/11/2017 0849   BASOSABS 0.0 10/11/2017 0849   Iron/TIBC/Ferritin/ %Sat No results found for: IRON, TIBC, FERRITIN, IRONPCTSAT Lipid Panel     Component Value Date/Time   CHOL 250 (H) 01/23/2018 0815   TRIG 101 01/23/2018 0815   HDL 72 01/23/2018 0815   LDLCALC 158 (H) 01/23/2018 0815   Hepatic Function Panel     Component Value Date/Time   PROT 7.0 01/23/2018 0815   ALBUMIN 3.8 01/23/2018 0815   AST 21 01/23/2018 0815   ALT 27 01/23/2018 0815   ALKPHOS 128 (H) 01/23/2018 0815   BILITOT 0.2 01/23/2018 0815      Component Value Date/Time   TSH 2.950 10/11/2017 0849   Results for Elisha PonderWOOTEN, Mikia LYNN (MRN [id number]) as of 07/21/2018 09:36  Ref. Range 01/23/2018 08:15  Vitamin D, 25-Hydroxy Latest Ref Range: 30.0 - 100.0 ng/mL 35.9    I, Marianna Paymentenise Haag, am acting as Energy managertranscriptionist for Quillian Quincearen Holle Sprick, MD I have reviewed the above documentation for accuracy and completeness, and I agree with the above. -Quillian Quincearen Lucindia Lemley, MD

## 2018-07-27 ENCOUNTER — Other Ambulatory Visit: Payer: Self-pay | Admitting: Family Medicine

## 2018-07-31 ENCOUNTER — Telehealth (INDEPENDENT_AMBULATORY_CARE_PROVIDER_SITE_OTHER): Payer: BC Managed Care – PPO | Admitting: Family Medicine

## 2018-07-31 ENCOUNTER — Other Ambulatory Visit: Payer: Self-pay

## 2018-07-31 ENCOUNTER — Encounter (INDEPENDENT_AMBULATORY_CARE_PROVIDER_SITE_OTHER): Payer: Self-pay | Admitting: Family Medicine

## 2018-07-31 DIAGNOSIS — E8881 Metabolic syndrome: Secondary | ICD-10-CM

## 2018-07-31 DIAGNOSIS — E559 Vitamin D deficiency, unspecified: Secondary | ICD-10-CM

## 2018-07-31 DIAGNOSIS — Z6841 Body Mass Index (BMI) 40.0 and over, adult: Secondary | ICD-10-CM

## 2018-07-31 MED ORDER — METFORMIN HCL 500 MG PO TABS: 500.0000 mg | ORAL_TABLET | Freq: Every day | ORAL | 0 refills | Status: DC

## 2018-07-31 MED ORDER — VITAMIN D (ERGOCALCIFEROL) 1.25 MG (50000 UNIT) PO CAPS: 50000.0000 [IU] | ORAL_CAPSULE | ORAL | 0 refills | Status: DC

## 2018-08-04 NOTE — Progress Notes (Signed)
Office: [phone]  /  Fax: [phone] TeleHealth Visit:  [name] Lynn Flores has verbally consented to this TeleHealth visit today. The patient is located at home, the provider is located at the [facility] office. The participants in this visit include the listed provider and patient. The visit was conducted today via Facetime.  HPI:   Chief Complaint: OBESITY Selena Flores is here to discuss her progress with her obesity treatment plan. She is on the  keep a food journal with 1500 calories and 90+g of protein  daily and is following her eating plan approximately 10 % of the time. She states she is exercising 0 minutes 0 times per week. [place] weight this morning was 223 lbs. She was on vacation last week and did not journal. She had increased celebration eating but is ready to get back on track. She is working extra hours and recognizes this is causing decreased meal planning and increased chances of skipping meals.  We were unable to weigh the patient today for this TeleHealth visit. She feels as if she has maybe gained weight since her last visit. She has lost 39 lbs since starting treatment with us.  Insulin Resistance Selena Flores has a diagnosis of insulin resistance based on her elevated fasting insulin level >5. Although Selena Flores's blood glucose readings are still under good control, insulin resistance puts her at greater risk of metabolic syndrome and diabetes. She is taking metformin currently and continues to work on diet and exercise to decrease risk of diabetes. She has been off her meal plan while on vacation last week but has increased activity. She has gotten back on track and feels well. She denies nausea, vomiting, and hypoglycemia.   Vitamin D deficiency Selena Flores has a diagnosis of vitamin D deficiency. She is currently taking vit D and denies nausea, vomiting or muscle weakness. Vitamin D level not at goal yet.   At risk for diabetes Selena Flores is at higher than  averagerisk for developing diabetes due to her obesity. She currently denies polyuria or polydipsia.  ASSESSMENT AND PLAN:  Insulin resistance - Plan: metFORMIN (GLUCOPHAGE) 500 MG tablet  Vitamin D deficiency - Plan: Vitamin D, Ergocalciferol, (DRISDOL) 1.25 MG (50000 UT) CAPS capsule  Class 3 severe obesity with serious comorbidity and body mass index (BMI) of 40.0 to 44.9 in adult, unspecified obesity type (HCC)  PLAN: Insulin Resistance Selena Flores will continue to work on weight loss, exercise, and decreasing simple carbohydrates in her diet to help decrease the risk of diabetes. We dicussed metformin including benefits and risks. She was informed that eating too many simple carbohydrates or too many calories at one sitting increases the likelihood of GI side effects. Selena Flores agrees to continue Metformin 500 mg daily #30 with no refills.  Selena Flores agreed to follow up with us as directed to monitor her progress. We will repeat labs in 1 month.   Vitamin D Deficiency Selena Flores was informed that low vitamin D levels contributes to fatigue and are associated with obesity, breast, and colon cancer. She agrees to continue to take prescription Vit D @50 ,000 IU every week #4 with no refills and will follow up for routine testing of vitamin D, at least 2-3 times per year. She was informed of the risk of over-replacement of vitamin D and agrees to not increase her dose unless she discusses this with us first. Agrees to follow up with our clinic as directed. We will repeat labs in 1 month.   Diabetes risk counselling Selena Flores was given  extended (15 minutes) diabetes prevention counseling today. She is 38 y.o. female and has risk factors for diabetes including obesity. We discussed intensive lifestyle modifications today with an emphasis on weight loss as well as increasing exercise and decreasing simple carbohydrates in her diet.  Obesity Selena Flores is currently in the action stage of change. As such, her  goal is to continue with weight loss efforts She has agreed to keep a food journal with 1500 calories and 90+g of protein daily.  Selena Flores has been instructed to work up to a goal of 150 minutes of combined cardio and strengthening exercise per week for weight loss and overall health benefits. We discussed the following Behavioral Modification Stratagies today: work on meal planning and easy cooking plans and no skipping meals.    Selena Flores has agreed to follow up with our clinic in 3 weeks. She was informed of the importance of frequent follow up visits to maximize her success with intensive lifestyle modifications for her multiple health conditions.  ALLERGIES: Allergies  Allergen Reactions   Ciprofloxacin Itching   Rocephin [Ceftriaxone] Itching   Penicillins Itching and Rash    Has patient had a PCN reaction causing immediate rash, facial/tongue/throat swelling, SOB or lightheadedness with hypotension: Unknown Has patient had a PCN reaction causing severe rash involving mucus membranes or skin necrosis: Unknown Has patient had a PCN reaction that required hospitalization Unknown Has patient had a PCN reaction occurring within the last 10 years: Yes If all of the above answers are "NO", then may proceed with Cephalosporin use.     MEDICATIONS: Current Outpatient Medications on File Prior to Visit  Medication Sig Dispense Refill   aspirin-acetaminophen-caffeine (EXCEDRIN MIGRAINE) 250-250-65 MG tablet Take 1 tablet by mouth every 6 (six) hours as needed for headache.     calcium carbonate (TUMS - DOSED IN MG ELEMENTAL CALCIUM) 500 MG chewable tablet Chew 1 tablet by mouth daily as needed for indigestion or heartburn.     meloxicam (MOBIC) 15 MG tablet Take 1 tablet (15 mg total) by mouth daily as needed for pain. 30 tablet 1   Norgestimate-Ethinyl Estradiol Triphasic 0.18/0.215/0.25 MG-35 MCG tablet Take 1 tablet by mouth daily.     omeprazole (PRILOSEC) 20 MG capsule Take 20 mg  by mouth daily as needed.     sertraline (ZOLOFT) 100 MG tablet TAKE ONE-HALF TABLET BY  MOUTH IN THE MORNING AND 1  TABLET IN THE EVENING 125 tablet 1   No current facility-administered medications on file prior to visit.     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   Patellofemoral syndrome 02/17/2015   Plantar fasciitis 11/28/2017   Preventative health care 10/15/2017   GYN: Dr Tami Ribasallahan [facility]   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   Vitamin D deficiency 11/28/2017    PAST SURGICAL HISTORY: Past Surgical History:  Procedure Laterality Date   CERVICAL CONE BIOPSY     TONSILLECTOMY      SOCIAL HISTORY: Social History  Tobacco Use   Smoking status: Never Smoker   Smokeless tobacco: Never Used   Tobacco comment: smoked for 1 yr socially in college  Substance Use Topics   Alcohol use: Yes    Comment: social-occ   Drug use: Not Currently    FAMILY HISTORY: Family History  Problem Relation Age of Onset   Heart disease Mother        chf   Obesity Mother    Anxiety disorder Mother    Heart failure Mother    Skin cancer Father    Cancer Father        skin cancer   Heart disease Maternal Grandmother    Heart disease Maternal Uncle     ROS: Review of Systems  Gastrointestinal: Negative for nausea and vomiting.  Musculoskeletal:       Negative for muscle weakness  Endo/Heme/Allergies: Negative for polydipsia.       Negative for  hypoglycemia  Negative for polyuria    PHYSICAL EXAM: Pt in no acute distress  RECENT LABS AND TESTS: BMET    Component Value Date/Time   NA 139 01/23/2018 0815   K 4.8 01/23/2018 0815   CL 102 01/23/2018 0815   CO2 22 01/23/2018 0815   GLUCOSE 89 01/23/2018 0815   GLUCOSE 106 (H) 04/06/2015 1136   BUN 14 01/23/2018 0815   CREATININE 0.87 01/23/2018 0815   CALCIUM 9.3 01/23/2018 0815   GFRNONAA 87 01/23/2018 0815   GFRAA 100 01/23/2018 0815   Lab Results  Component Value Date   HGBA1C 5.4 01/23/2018   HGBA1C 5.4 10/11/2017   Lab Results  Component Value Date   INSULIN 7.4 01/23/2018   INSULIN 10.3 10/11/2017   CBC    Component Value Date/Time   WBC 6.6 10/11/2017 0849   WBC 10.5 04/06/2015 1136   RBC 4.85 10/11/2017 0849   RBC 5.07 04/06/2015 1136   HGB 14.1 10/11/2017 0849   HCT 42.4 10/11/2017 0849   PLT 285 04/06/2015 1136   MCV 87 10/11/2017 0849   MCH 29.1 10/11/2017 0849   MCH 29.4 04/06/2015 1136   MCHC 33.3 10/11/2017 0849   MCHC 32.4 04/06/2015 1136   RDW 13.1 10/11/2017 0849   LYMPHSABS 1.7 10/11/2017 0849   MONOABS 1.1 (H) 02/28/2007 0247   EOSABS 0.0 10/11/2017 0849   BASOSABS 0.0 10/11/2017 0849   Iron/TIBC/Ferritin/ %Sat No results found for: IRON, TIBC, FERRITIN, IRONPCTSAT Lipid Panel     Component Value Date/Time   CHOL 250 (H) 01/23/2018 0815   TRIG 101 01/23/2018 0815   HDL 72 01/23/2018 0815   LDLCALC 158 (H) 01/23/2018 0815   Hepatic Function Panel     Component Value Date/Time   PROT 7.0 01/23/2018 0815   ALBUMIN 3.8 01/23/2018 0815   AST 21 01/23/2018 0815   ALT 27 01/23/2018 0815   ALKPHOS 128 (H) 01/23/2018 0815   BILITOT 0.2 01/23/2018 0815      Component Value Date/Time   TSH 2.950 10/11/2017 0849     Ref. Range 01/23/2018 08:15  Vitamin D, 25-Hydroxy Latest Ref Range: 30.0 - 100.0 ng/mL 35.9     I, Renee Ramus, am acting as Location manager for Dennard Nip, MD

## 2018-08-07 ENCOUNTER — Ambulatory Visit: Payer: BC Managed Care – PPO | Admitting: Psychology

## 2018-08-21 ENCOUNTER — Telehealth (INDEPENDENT_AMBULATORY_CARE_PROVIDER_SITE_OTHER): Payer: BC Managed Care – PPO | Admitting: Family Medicine

## 2018-08-21 ENCOUNTER — Other Ambulatory Visit: Payer: Self-pay

## 2018-08-21 ENCOUNTER — Encounter (INDEPENDENT_AMBULATORY_CARE_PROVIDER_SITE_OTHER): Payer: Self-pay | Admitting: Family Medicine

## 2018-08-21 DIAGNOSIS — G4700 Insomnia, unspecified: Secondary | ICD-10-CM

## 2018-08-21 DIAGNOSIS — Z6841 Body Mass Index (BMI) 40.0 and over, adult: Secondary | ICD-10-CM | POA: Diagnosis not present

## 2018-08-25 ENCOUNTER — Ambulatory Visit: Payer: BC Managed Care – PPO | Admitting: Psychology

## 2018-08-25 NOTE — Progress Notes (Signed)
Office: [phone]  /  Fax: [phone] TeleHealth Visit:  Selena PonderMichelle Lynn Flores has verbally consented to this TeleHealth visit today. The patient is located at home, the provider is located at the [facility] office. The participants in this visit include the listed provider and patient. The visit was conducted today via FaceTime.  HPI:   Chief Complaint: OBESITY Selena Flores is here to discuss her progress with her obesity treatment plan. She is keeping a food journal with 1500 calories and 90 grams of protein and is following her eating plan approximately 25% of the time. She states she is exercising 0 minutes 0 times per week. Selena Flores states her weight today was 221 and is down 1 lb since our last visit. She is not journaling much due to increased work hours and work stress and has not been able to meal plan or exercise. She is still mindful of her food choices, though.  We were unable to weigh the patient today for this TeleHealth visit. She feels as if she has lost 1 lb since her last visit. She has lost 39 lbs since starting treatment with us.  Insomnia Selena Flores has been working very long hours doing "mental" work and is not moving much. She notes difficulty falling asleep because her mind is racing. She has not tried any OTC medications and states this has been present for approximately 3 weeks.   ASSESSMENT AND PLAN:  Insomnia, unspecified type  Class 3 severe obesity with serious comorbidity and body mass index (BMI) of 40.0 to 44.9 in adult, unspecified obesity type (HCC)  PLAN:  Insomnia Sleep hygiene techniques were discussed with Selena Flores including moving from her computer at least 2-3 minutes every hour. She will also discuss with Dr. Dewaine CongerBarker, her psychologist.  I spent > than 50% of the 15 minute visit on counseling as documented in the note.  Obesity Selena Flores is currently in the action stage of change. As such, her goal is to continue with weight loss  efforts. She has agreed to keep a food journal with 1500 calories and 90 grams of protein.  Selena Flores has been instructed to work up to a goal of 150 minutes of combined cardio and strengthening exercise per week for weight loss and overall health benefits. We discussed the following Behavioral Modification Strategies today: keeping healthy foods in the home, ways to avoid boredom eating, and emotional eating strategies.  Selena Flores has agreed to follow-up with our clinic in 3 weeks. She was informed of the importance of frequent follow-up visits to maximize her success with intensive lifestyle modifications for her multiple health conditions.  ALLERGIES: Allergies  Allergen Reactions  . Ciprofloxacin Itching  . Rocephin [Ceftriaxone] Itching  . Penicillins Itching and Rash    Has patient had a PCN reaction causing immediate rash, facial/tongue/throat swelling, SOB or lightheadedness with hypotension: Unknown Has patient had a PCN reaction causing severe rash involving mucus membranes or skin necrosis: Unknown Has patient had a PCN reaction that required hospitalization Unknown Has patient had a PCN reaction occurring within the last 10 years: Yes If all of the above answers are "NO", then may proceed with Cephalosporin use.     MEDICATIONS: Current Outpatient Medications on File Prior to Visit  Medication Sig Dispense Refill  . aspirin-acetaminophen-caffeine (EXCEDRIN MIGRAINE) 250-250-65 MG tablet Take 1 tablet by mouth every 6 (six) hours as needed for headache.    . calcium carbonate (TUMS - DOSED IN MG ELEMENTAL CALCIUM) 500 MG chewable tablet Chew 1 tablet  by mouth daily as needed for indigestion or heartburn.    . meloxicam (MOBIC) 15 MG tablet Take 1 tablet (15 mg total) by mouth daily as needed for pain. 30 tablet 1  . metFORMIN (GLUCOPHAGE) 500 MG tablet Take 1 tablet (500 mg total) by mouth daily with breakfast. 30 tablet 0  . Norgestimate-Ethinyl Estradiol Triphasic  0.18/0.215/0.25 MG-35 MCG tablet Take 1 tablet by mouth daily.    Marland Kitchen. omeprazole (PRILOSEC) 20 MG capsule Take 20 mg by mouth daily as needed.    . sertraline (ZOLOFT) 100 MG tablet TAKE ONE-HALF TABLET BY  MOUTH IN THE MORNING AND 1  TABLET IN THE EVENING 125 tablet 1  . Vitamin D, Ergocalciferol, (DRISDOL) 1.25 MG (50000 UT) CAPS capsule Take 1 capsule (50,000 Units total) by mouth every 7 (seven) days. 4 capsule 0   No current facility-administered medications on file prior to visit.     PAST MEDICAL HISTORY: Past Medical History:  Diagnosis Date  . Anxiety   . Anxiety disorder 02/17/2015  . Chronic kidney disease    kidney infection 2008 ish  . Constipation   . Esophageal reflux 02/17/2015  . Food allergy    onions itching  . GERD (gastroesophageal reflux disease)   . Hyperlipidemia   . Intermittent palpitations 02/25/2018  . Melasma 02/17/2015  . Migraines   . Morbid obesity (HCC) 07/10/2017  . Morbid obesity with BMI of 45.0-49.9, adult (HCC) 02/17/2015  . 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 Plantar fasciitis 11/28/2017  . Preventative health care 10/15/2017   GYN: Dr Tami Ribasallahan [facility]  . 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  . Vitamin D deficiency 11/28/2017    PAST SURGICAL HISTORY: Past Surgical History:  Procedure Laterality Date  . CERVICAL CONE BIOPSY    . TONSILLECTOMY      SOCIAL HISTORY: Social History   Tobacco Use  . Smoking status: Never Smoker  . Smokeless tobacco: Never Used  . Tobacco comment: smoked for 1 yr socially in college  Substance Use Topics  . Alcohol use: Yes    Comment: social-occ  . Drug use: Not Currently    FAMILY HISTORY: Family History  Problem Relation Age of Onset  . Heart disease Mother        chf  . Obesity Mother   . Anxiety disorder Mother   . Heart failure Mother   . Skin cancer Father   . Cancer Father        skin cancer  . Heart  disease Maternal Grandmother   . Heart disease Maternal Uncle    ROS: Review of Systems  Psychiatric/Behavioral: The patient has insomnia.    PHYSICAL EXAM: Pt in no acute distress  RECENT LABS AND TESTS: BMET    Component Value Date/Time   NA 139 01/23/2018 0815   K 4.8 01/23/2018 0815   CL 102 01/23/2018 0815   CO2 22 01/23/2018 0815   GLUCOSE 89 01/23/2018 0815   GLUCOSE 106 (H) 04/06/2015 1136   BUN 14 01/23/2018 0815   CREATININE 0.87 01/23/2018 0815   CALCIUM 9.3 01/23/2018 0815   GFRNONAA 87 01/23/2018 0815   GFRAA 100 01/23/2018 0815   Lab Results  Component Value Date   HGBA1C 5.4 01/23/2018   HGBA1C 5.4 10/11/2017   Lab Results  Component Value Date   INSULIN 7.4 01/23/2018   INSULIN 10.3 10/11/2017   CBC    Component Value Date/Time   WBC 6.6 10/11/2017 0849  WBC 10.5 04/06/2015 1136   RBC 4.85 10/11/2017 0849   RBC 5.07 04/06/2015 1136   HGB 14.1 10/11/2017 0849   HCT 42.4 10/11/2017 0849   PLT 285 04/06/2015 1136   MCV 87 10/11/2017 0849   MCH 29.1 10/11/2017 0849   MCH 29.4 04/06/2015 1136   MCHC 33.3 10/11/2017 0849   MCHC 32.4 04/06/2015 1136   RDW 13.1 10/11/2017 0849   LYMPHSABS 1.7 10/11/2017 0849   MONOABS 1.1 (H) 02/28/2007 0247   EOSABS 0.0 10/11/2017 0849   BASOSABS 0.0 10/11/2017 0849   Iron/TIBC/Ferritin/ %Sat No results found for: IRON, TIBC, FERRITIN, IRONPCTSAT Lipid Panel     Component Value Date/Time   CHOL 250 (H) 01/23/2018 0815   TRIG 101 01/23/2018 0815   HDL 72 01/23/2018 0815   LDLCALC 158 (H) 01/23/2018 0815   Hepatic Function Panel     Component Value Date/Time   PROT 7.0 01/23/2018 0815   ALBUMIN 3.8 01/23/2018 0815   AST 21 01/23/2018 0815   ALT 27 01/23/2018 0815   ALKPHOS 128 (H) 01/23/2018 0815   BILITOT 0.2 01/23/2018 0815      Component Value Date/Time   TSH 2.950 10/11/2017 0849   Results for SHADDAI, SHAPLEY (MRN [id number]) as of 08/25/2018 09:46  Ref. Range 01/23/2018 08:15  Vitamin D,  25-Hydroxy Latest Ref Range: 30.0 - 100.0 ng/mL 35.9   I, Michaelene Song, am acting as Location manager for Dennard Nip, MD I have reviewed the above documentation for accuracy and completeness, and I agree with the above. -Dennard Nip, MD

## 2018-09-08 ENCOUNTER — Encounter (INDEPENDENT_AMBULATORY_CARE_PROVIDER_SITE_OTHER): Payer: Self-pay

## 2018-09-11 ENCOUNTER — Ambulatory Visit (INDEPENDENT_AMBULATORY_CARE_PROVIDER_SITE_OTHER): Payer: BC Managed Care – PPO | Admitting: Physician Assistant

## 2018-09-11 ENCOUNTER — Other Ambulatory Visit: Payer: Self-pay

## 2018-09-11 VITALS — BP 106/75 | HR 70 | Temp 97.8°F | Ht 63.0 in | Wt 222.0 lb

## 2018-09-11 DIAGNOSIS — E8881 Metabolic syndrome: Secondary | ICD-10-CM

## 2018-09-11 DIAGNOSIS — Z9189 Other specified personal risk factors, not elsewhere classified: Secondary | ICD-10-CM | POA: Diagnosis not present

## 2018-09-11 DIAGNOSIS — E559 Vitamin D deficiency, unspecified: Secondary | ICD-10-CM

## 2018-09-11 DIAGNOSIS — Z6839 Body mass index (BMI) 39.0-39.9, adult: Secondary | ICD-10-CM

## 2018-09-11 MED ORDER — METFORMIN HCL 500 MG PO TABS: 500.0000 mg | ORAL_TABLET | Freq: Every day | ORAL | 0 refills | Status: DC

## 2018-09-11 MED ORDER — VITAMIN D (ERGOCALCIFEROL) 1.25 MG (50000 UNIT) PO CAPS: 50000.0000 [IU] | ORAL_CAPSULE | ORAL | 0 refills | Status: DC

## 2018-09-11 NOTE — Progress Notes (Signed)
Office: [phone]  /  Fax: [phone]   HPI:   Chief Complaint: OBESITY Selena Flores is here to discuss her progress with her obesity treatment plan. She is on the  keep a food journal with 1500 calories and 95g of  protein daily and is following her eating plan approximately 30 % of the time. She states she is exercising 0 minutes 0 times per week. Selena Flores reports that she is not journaling. She is very busy with work and is trying to make good decision with food but having a difficult time. Her boyfriend brings foods in the house that are tempting for her.  Her weight is 222 lb (100.7 kg) today and has had a weight loss of 5 pounds over a period of 5 months since her last in office  visit. She has lost 44 lbs since starting treatment with [place].  Insulin Resistance Selena Flores has a diagnosis of insulin resistance based on her elevated fasting insulin level >5. Although [name]'s blood glucose readings are still under good control, insulin resistance puts her at greater risk of metabolic syndrome and diabetes. She is taking metformin currently and continues to work on diet and exercise to decrease risk of diabetes. She denies polyphagia, nausea, vomiting, and diarrhea.   Vitamin D deficiency Selena Flores has a diagnosis of vitamin D deficiency. She is currently taking vit D and denies nausea, vomiting or muscle weakness.  At risk for osteopenia and osteoporosis Selena Flores is at higher risk of osteopenia and osteoporosis due to vitamin D deficiency.   ASSESSMENT AND PLAN:  Insulin resistance - Plan: metFORMIN (GLUCOPHAGE) 500 MG tablet, Comprehensive metabolic panel, Hemoglobin A1c, Insulin, random, Lipid Panel With LDL/HDL Ratio  Vitamin D deficiency - Plan: Vitamin D, Ergocalciferol, (DRISDOL) 1.25 MG (50000 UT) CAPS capsule, VITAMIN D 25 Hydroxy (Vit-D Deficiency, Fractures)  At risk for osteoporosis  Class 2 severe obesity with serious comorbidity and body mass index (BMI) of 39.0 to 39.9  in adult, unspecified obesity type ([name])  PLAN: Insulin Resistance Selena Flores will continue to work on weight loss, exercise, and decreasing simple carbohydrates in her diet to help decrease the risk of diabetes. We dicussed metformin including benefits and risks. She was informed that eating too many simple carbohydrates or too many calories at one sitting increases the likelihood of GI side effects. [name] agrees to continue Metformin 500 mg daily #30 with no refills. [name] agreed to follow up with [place] as directed to monitor her progress.  Vitamin D Deficiency Selena Flores was informed that low vitamin D levels contributes to fatigue and are associated with obesity, breast, and colon cancer. She agrees to continue to take prescription Vit D @50 ,000 IU every week #4 with no refills and will follow up for routine testing of vitamin D, at least 2-3 times per year. She was informed of the risk of over-replacement of vitamin D and agrees to not increase her dose unless she discusses this with [place] first. Agrees to follow up with our clinic as directed.    At risk for osteopenia and osteoporosis Selena Flores was given extended  (15 minutes) osteoporosis prevention counseling today. Selena Flores is at risk for osteopenia and osteoporosis due to her vitamin D deficiency. She was encouraged to take her vitamin D and follow her higher calcium diet and increase strengthening exercise to help strengthen her bones and decrease her risk of osteopenia and osteoporosis.  Obesity Selena Flores is currently in the action stage of change. As such, her goal is to continue with weight loss efforts  She has agreed to keep a food journal with 1500 calories and 95g of protein daily.  Selena Flores has been instructed to work up to a goal of 150 minutes of combined cardio and strengthening exercise per week for weight loss and overall health benefits. We discussed the following Behavioral Modification Strategies today: increasing lean protein  intake and work on meal planning and easy cooking plans   Selena Flores has agreed to follow up with our clinic in 2 weeks. She was informed of the importance of frequent follow up visits to maximize her success with intensive lifestyle modifications for her multiple health conditions.  ALLERGIES: Allergies  Allergen Reactions   Ciprofloxacin Itching   Rocephin [Ceftriaxone] Itching   Penicillins Itching and Rash    Has patient had a PCN reaction causing immediate rash, facial/tongue/throat swelling, SOB or lightheadedness with hypotension: Unknown Has patient had a PCN reaction causing severe rash involving mucus membranes or skin necrosis: Unknown Has patient had a PCN reaction that required hospitalization Unknown Has patient had a PCN reaction occurring within the last 10 years: Yes If all of the above answers are "NO", then may proceed with Cephalosporin use.     MEDICATIONS: Current Outpatient Medications on File Prior to Visit  Medication Sig Dispense Refill   aspirin-acetaminophen-caffeine (EXCEDRIN MIGRAINE) 250-250-65 MG tablet Take 1 tablet by mouth every 6 (six) hours as needed for headache.     calcium carbonate (TUMS - DOSED IN MG ELEMENTAL CALCIUM) 500 MG chewable tablet Chew 1 tablet by mouth daily as needed for indigestion or heartburn.     meloxicam (MOBIC) 15 MG tablet Take 1 tablet (15 mg total) by mouth daily as needed for pain. 30 tablet 1   Norgestimate-Ethinyl Estradiol Triphasic 0.18/0.215/0.25 MG-35 MCG tablet Take 1 tablet by mouth daily.     omeprazole (PRILOSEC) 20 MG capsule Take 20 mg by mouth daily as needed.     sertraline (ZOLOFT) 100 MG tablet TAKE ONE-HALF TABLET BY  MOUTH IN THE MORNING AND 1  TABLET IN THE EVENING 125 tablet 1   No current facility-administered medications on file prior to visit.     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   Patellofemoral syndrome 02/17/2015   Plantar fasciitis 11/28/2017   Preventative health care 10/15/2017   GYN: Dr Tami Ribasallahan [facility]   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   Vitamin D deficiency 11/28/2017    PAST SURGICAL HISTORY: Past Surgical History:  Procedure Laterality Date   CERVICAL CONE BIOPSY     TONSILLECTOMY      SOCIAL HISTORY: Social History   Tobacco Use   Smoking status: Never Smoker   Smokeless tobacco: Never Used   Tobacco comment: smoked for 1 yr socially in college  Substance Use Topics   Alcohol use: Yes    Comment: social-occ   Drug use: Not Currently    FAMILY HISTORY: Family History  Problem Relation Age of Onset   Heart disease Mother  chf   Obesity Mother    Anxiety disorder Mother    Heart failure Mother    Skin cancer Father    Cancer Father        skin cancer   Heart disease Maternal Grandmother    Heart disease Maternal Uncle     ROS: Review of Systems  Constitutional: Positive for weight loss.  Cardiovascular: Negative for chest pain and claudication.  Gastrointestinal: Negative for diarrhea, nausea and vomiting.  Musculoskeletal: Negative for myalgias.       Negative for muscle weakness   Endo/Heme/Allergies:       Negative for polyphagia     PHYSICAL EXAM: Blood pressure 106/75, pulse 70, temperature 97.8 F (36.6 C), temperature source Oral, height 5\' 3"  (1.6 m), weight 222 lb (100.7 kg), SpO2 100 %. Body mass index is 39.33 kg/m. Physical Exam Vitals signs reviewed.    Constitutional:      Appearance: Normal appearance. She is obese.  HENT:     Head: Normocephalic.     Nose: Nose normal.  Neck:     Musculoskeletal: Normal range of motion.  Cardiovascular:     Rate and Rhythm: Normal rate.  Pulmonary:     Effort: Pulmonary effort is normal.  Musculoskeletal: Normal range of motion.  Skin:    General: Skin is warm and dry.  Neurological:     Mental Status: She is alert and oriented to person, place, and time.  Psychiatric:        Mood and Affect: Mood normal.        Behavior: Behavior normal.     RECENT LABS AND TESTS: BMET    Component Value Date/Time   NA 139 01/23/2018 0815   K 4.8 01/23/2018 0815   CL 102 01/23/2018 0815   CO2 22 01/23/2018 0815   GLUCOSE 89 01/23/2018 0815   GLUCOSE 106 (H) 04/06/2015 1136   BUN 14 01/23/2018 0815   CREATININE 0.87 01/23/2018 0815   CALCIUM 9.3 01/23/2018 0815   GFRNONAA 87 01/23/2018 0815   GFRAA 100 01/23/2018 0815   Lab Results  Component Value Date   HGBA1C 5.4 01/23/2018   HGBA1C 5.4 10/11/2017   Lab Results  Component Value Date   INSULIN 7.4 01/23/2018   INSULIN 10.3 10/11/2017   CBC    Component Value Date/Time   WBC 6.6 10/11/2017 0849   WBC 10.5 04/06/2015 1136   RBC 4.85 10/11/2017 0849   RBC 5.07 04/06/2015 1136   HGB 14.1 10/11/2017 0849   HCT 42.4 10/11/2017 0849   PLT 285 04/06/2015 1136   MCV 87 10/11/2017 0849   MCH 29.1 10/11/2017 0849   MCH 29.4 04/06/2015 1136   MCHC 33.3 10/11/2017 0849   MCHC 32.4 04/06/2015 1136   RDW 13.1 10/11/2017 0849   LYMPHSABS 1.7 10/11/2017 0849   MONOABS 1.1 (H) 02/28/2007 0247   EOSABS 0.0 10/11/2017 0849   BASOSABS 0.0 10/11/2017 0849   Iron/TIBC/Ferritin/ %Sat No results found for: IRON, TIBC, FERRITIN, IRONPCTSAT Lipid Panel     Component Value Date/Time   CHOL 250 (H) 01/23/2018 0815   TRIG 101 01/23/2018 0815   HDL 72 01/23/2018 0815   LDLCALC 158 (H) 01/23/2018 0815   Hepatic Function Panel     Component  Value Date/Time   PROT 7.0 01/23/2018 0815   ALBUMIN 3.8 01/23/2018 0815   AST 21 01/23/2018 0815   ALT 27 01/23/2018 0815   ALKPHOS 128 (H) 01/23/2018 0815   BILITOT 0.2 01/23/2018 0815  Component Value Date/Time   TSH 2.950 10/11/2017 0849     Ref. Range 01/23/2018 08:15  Vitamin D, 25-Hydroxy Latest Ref Range: 30.0 - 100.0 ng/mL 35.9     OBESITY BEHAVIORAL INTERVENTION VISIT  Today's visit was # 16   Starting weight: 266 lbs Starting date: 10/10/17 Today's weight : Weight: 222 lb (100.7 kg)  Today's date: 09/11/2018 Total lbs lost to date: 44 lbs At least 15 minutes were spent on discussing the following behavioral intervention visit.   ASK: We discussed the diagnosis of obesity with Selena PonderMichelle Lynn Flores today and Selena Flores agreed to give us permission to discuss obesity behavioral modification therapy today.  ASSESS: Selena Flores has the diagnosis of obesity and her BMI today is 39.34 Selena Flores is in the action stage of change   ADVISE: Selena Flores was educated on the multiple health risks of obesity as well as the benefit of weight loss to improve her health. She was advised of the need for long term treatment and the importance of lifestyle modifications to improve her current health and to decrease her risk of future health problems.  AGREE: Multiple dietary modification options and treatment options were discussed and  Selena Flores agreed to follow the recommendations documented in the above note.  ARRANGE: Selena Flores was educated on the importance of frequent visits to treat obesity as outlined per CMS and USPSTF guidelines and agreed to schedule her next follow up appointment today.  Otis PeakI, Zonia Caplin, am acting as transcriptionist for Alois Clicheracey Aguilar, PA-C  I, Alois Clicheracey Aguilar, PA-C have reviewed above note and agree with its content

## 2018-09-12 LAB — VITAMIN D 25 HYDROXY (VIT D DEFICIENCY, FRACTURES): Vit D, 25-Hydroxy: 47.3 ng/mL (ref 30.0–100.0)

## 2018-09-12 LAB — HEMOGLOBIN A1C
Est. average glucose Bld gHb Est-mCnc: 103 mg/dL
Hgb A1c MFr Bld: 5.2 % (ref 4.8–5.6)

## 2018-09-12 LAB — COMPREHENSIVE METABOLIC PANEL
ALT: 13 IU/L (ref 0–32)
AST: 14 IU/L (ref 0–40)
Albumin/Globulin Ratio: 1.2 (ref 1.2–2.2)
Albumin: 3.8 g/dL (ref 3.8–4.8)
Alkaline Phosphatase: 97 IU/L (ref 39–117)
BUN/Creatinine Ratio: 22 (ref 9–23)
BUN: 17 mg/dL (ref 6–20)
Bilirubin Total: 0.2 mg/dL (ref 0.0–1.2)
CO2: 24 mmol/L (ref 20–29)
Calcium: 9.3 mg/dL (ref 8.7–10.2)
Chloride: 99 mmol/L (ref 96–106)
Creatinine, Ser: 0.79 mg/dL (ref 0.57–1.00)
GFR calc Af Amer: 112 mL/min/{1.73_m2} (ref >59)
GFR calc non Af Amer: 97 mL/min/{1.73_m2} (ref >59)
Globulin, Total: 3.1 g/dL (ref 1.5–4.5)
Glucose: 84 mg/dL (ref 65–99)
Potassium: 5.4 mmol/L — ABNORMAL HIGH (ref 3.5–5.2)
Sodium: 139 mmol/L (ref 134–144)
Total Protein: 6.9 g/dL (ref 6.0–8.5)

## 2018-09-12 LAB — LIPID PANEL WITH LDL/HDL RATIO
Cholesterol, Total: 229 mg/dL — ABNORMAL HIGH (ref 100–199)
HDL: 85 mg/dL (ref >39)
LDL Calculated: 125 mg/dL — ABNORMAL HIGH (ref 0–99)
LDl/HDL Ratio: 1.5 ratio (ref 0.0–3.2)
Triglycerides: 93 mg/dL (ref 0–149)
VLDL Cholesterol Cal: 19 mg/dL (ref 5–40)

## 2018-09-12 LAB — INSULIN, RANDOM: INSULIN: 7.3 u[IU]/mL (ref 2.6–24.9)

## 2018-09-19 ENCOUNTER — Ambulatory Visit (INDEPENDENT_AMBULATORY_CARE_PROVIDER_SITE_OTHER): Payer: BC Managed Care – PPO | Admitting: Psychology

## 2018-09-19 DIAGNOSIS — F411 Generalized anxiety disorder: Secondary | ICD-10-CM

## 2018-09-30 ENCOUNTER — Ambulatory Visit (INDEPENDENT_AMBULATORY_CARE_PROVIDER_SITE_OTHER): Payer: BC Managed Care – PPO | Admitting: Physician Assistant

## 2018-10-15 ENCOUNTER — Other Ambulatory Visit: Payer: Self-pay | Admitting: Family Medicine

## 2018-10-17 ENCOUNTER — Ambulatory Visit (INDEPENDENT_AMBULATORY_CARE_PROVIDER_SITE_OTHER): Payer: BC Managed Care – PPO | Admitting: Psychology

## 2018-10-17 DIAGNOSIS — F411 Generalized anxiety disorder: Secondary | ICD-10-CM

## 2018-12-01 ENCOUNTER — Other Ambulatory Visit: Payer: Self-pay | Admitting: Family Medicine

## 2018-12-01 MED ORDER — SERTRALINE HCL 100 MG PO TABS: ORAL_TABLET | ORAL | 0 refills | Status: DC

## 2018-12-01 NOTE — Telephone Encounter (Signed)
Pt states her sertraline (ZOLOFT) 100 MG tablet is running late from mail order and she took last pill today. Pt wants  To know if you could send in a 7 day supply to local pharmacy?  [id number] - [place], [place] - [address] AT [place] [address] [phone] (Phone) [phone] (Fax)

## 2019-02-11 ENCOUNTER — Other Ambulatory Visit: Payer: Self-pay

## 2019-02-12 ENCOUNTER — Ambulatory Visit (INDEPENDENT_AMBULATORY_CARE_PROVIDER_SITE_OTHER): Payer: BC Managed Care – PPO | Admitting: Family Medicine

## 2019-02-12 ENCOUNTER — Other Ambulatory Visit: Payer: Self-pay

## 2019-02-12 ENCOUNTER — Encounter: Payer: Self-pay | Admitting: Family Medicine

## 2019-02-12 DIAGNOSIS — R040 Epistaxis: Secondary | ICD-10-CM | POA: Diagnosis not present

## 2019-02-12 DIAGNOSIS — E559 Vitamin D deficiency, unspecified: Secondary | ICD-10-CM

## 2019-02-12 DIAGNOSIS — E785 Hyperlipidemia, unspecified: Secondary | ICD-10-CM | POA: Diagnosis not present

## 2019-02-12 DIAGNOSIS — Z0000 Encounter for general adult medical examination without abnormal findings: Secondary | ICD-10-CM

## 2019-02-12 DIAGNOSIS — F419 Anxiety disorder, unspecified: Secondary | ICD-10-CM

## 2019-02-12 DIAGNOSIS — Z6841 Body Mass Index (BMI) 40.0 and over, adult: Secondary | ICD-10-CM

## 2019-02-12 DIAGNOSIS — K219 Gastro-esophageal reflux disease without esophagitis: Secondary | ICD-10-CM | POA: Diagnosis not present

## 2019-02-12 LAB — LIPID PANEL
Cholesterol: 240 mg/dL — ABNORMAL HIGH (ref 0–200)
HDL: 86.8 mg/dL (ref >39.00)
LDL Cholesterol: 130 mg/dL — ABNORMAL HIGH (ref 0–99)
NonHDL: 152.89
Total CHOL/HDL Ratio: 3
Triglycerides: 115 mg/dL (ref 0.0–149.0)
VLDL: 23 mg/dL (ref 0.0–40.0)

## 2019-02-12 LAB — COMPREHENSIVE METABOLIC PANEL
ALT: 15 U/L (ref 0–35)
AST: 15 U/L (ref 0–37)
Albumin: 3.7 g/dL (ref 3.5–5.2)
Alkaline Phosphatase: 89 U/L (ref 39–117)
BUN: 23 mg/dL (ref 6–23)
CO2: 29 mEq/L (ref 19–32)
Calcium: 9.3 mg/dL (ref 8.4–10.5)
Chloride: 103 mEq/L (ref 96–112)
Creatinine, Ser: 0.84 mg/dL (ref 0.40–1.20)
GFR: 76.62 mL/min (ref >60.00)
Glucose, Bld: 81 mg/dL (ref 70–99)
Potassium: 5.3 mEq/L — ABNORMAL HIGH (ref 3.5–5.1)
Sodium: 137 mEq/L (ref 135–145)
Total Bilirubin: 0.3 mg/dL (ref 0.2–1.2)
Total Protein: 7.2 g/dL (ref 6.0–8.3)

## 2019-02-12 LAB — CBC
HCT: 43.5 % (ref 36.0–46.0)
Hemoglobin: 14.3 g/dL (ref 12.0–15.0)
MCHC: 33 g/dL (ref 30.0–36.0)
MCV: 90.3 fl (ref 78.0–100.0)
Platelets: 314 10*3/uL (ref 150.0–400.0)
RBC: 4.82 Mil/uL (ref 3.87–5.11)
RDW: 13.5 % (ref 11.5–15.5)
WBC: 6.1 10*3/uL (ref 4.0–10.5)

## 2019-02-12 LAB — VITAMIN D 25 HYDROXY (VIT D DEFICIENCY, FRACTURES): VITD: 28.38 ng/mL — ABNORMAL LOW (ref 30.00–100.00)

## 2019-02-12 LAB — TSH: TSH: 3.38 u[IU]/mL (ref 0.35–4.50)

## 2019-02-12 MED ORDER — SERTRALINE HCL 100 MG PO TABS: ORAL_TABLET | ORAL | 1 refills | Status: DC

## 2019-02-12 MED ORDER — SERTRALINE HCL 100 MG PO TABS: ORAL_TABLET | ORAL | 0 refills | Status: DC

## 2019-02-12 MED ORDER — MUPIROCIN 2 % EX OINT: 1.0000 "application " | TOPICAL_OINTMENT | Freq: Two times a day (BID) | CUTANEOUS | 0 refills | Status: DC

## 2019-02-12 NOTE — Assessment & Plan Note (Signed)
Mupirocin ointment to nares qhs x 7days and report worsening

## 2019-02-12 NOTE — Assessment & Plan Note (Signed)
Supplement and monitor 

## 2019-02-12 NOTE — Assessment & Plan Note (Signed)
Was doing well so she stopped therapy and she finds she has flared a little. The Sertraline has been helpful

## 2019-02-12 NOTE — Patient Instructions (Addendum)
[email or web address]  Multivitamin with minerals, sleneium Vitamin D 01-1998 IU daily Aspirin EC at bedtime81 mg daily Probiotic daily  Melatonin 2.5-5 mg at bedtime  Omron blood pressure, upper arm, [email or web address] pulse oximeter, want oxygen in 90s   Vitals weekly  Preventive Care 42-37 Years Old, Female Preventive care refers to visits with your health care provider and lifestyle choices that can promote health and wellness. This includes:  A yearly physical exam. This may also be called an annual well check.  Regular dental visits and eye exams.  Immunizations.  Screening for certain conditions.  Healthy lifestyle choices, such as eating a healthy diet, getting regular exercise, not using drugs or products that contain nicotine and tobacco, and limiting alcohol use. What can I expect for my preventive care visit? Physical exam Your health care provider will check your:  Height and weight. This may be used to calculate body mass index (BMI), which tells if you are at a healthy weight.  Heart rate and blood pressure.  Skin for abnormal spots. Counseling Your health care provider may ask you questions about your:  Alcohol, tobacco, and drug use.  Emotional well-being.  Home and relationship well-being.  Sexual activity.  Eating habits.  Work and work Statistician.  Method of birth control.  Menstrual cycle.  Pregnancy history. What immunizations do I need?  Influenza (flu) vaccine  This is recommended every year. Tetanus, diphtheria, and pertussis (Tdap) vaccine  You may need a Td booster every 10 years. Varicella (chickenpox) vaccine  You may need this if you have not been vaccinated. Human papillomavirus (HPV) vaccine  If recommended by your health care provider, you may need three doses over 6 months. Measles, mumps, and rubella (MMR) vaccine  You may need at least one dose of MMR. You may also need a second  dose. Meningococcal conjugate (MenACWY) vaccine  One dose is recommended if you are age 31-21 years and a first-year college student living in a residence hall, or if you have one of several medical conditions. You may also need additional booster doses. Pneumococcal conjugate (PCV13) vaccine  You may need this if you have certain conditions and were not previously vaccinated. Pneumococcal polysaccharide (PPSV23) vaccine  You may need one or two doses if you smoke cigarettes or if you have certain conditions. Hepatitis A vaccine  You may need this if you have certain conditions or if you travel or work in places where you may be exposed to hepatitis A. Hepatitis B vaccine  You may need this if you have certain conditions or if you travel or work in places where you may be exposed to hepatitis B. Haemophilus influenzae type b (Hib) vaccine  You may need this if you have certain conditions. You may receive vaccines as individual doses or as more than one vaccine together in one shot (combination vaccines). Talk with your health care provider about the risks and benefits of combination vaccines. What tests do I need?  Blood tests  Lipid and cholesterol levels. These may be checked every 5 years starting at age 77.  Hepatitis C test.  Hepatitis B test. Screening  Diabetes screening. This is done by checking your blood sugar (glucose) after you have not eaten for a while (fasting).  Sexually transmitted disease (STD) testing.  BRCA-related cancer screening. This may be done if you have a family history of breast, ovarian, tubal, or peritoneal cancers.  Pelvic exam and Pap test. This may be done every 3 years starting at age  21. Starting at age 11, this may be done every 5 years if you have a Pap test in combination with an HPV test. Talk with your health care provider about your test results, treatment options, and if necessary, the need for more tests. Follow these instructions at  home: Eating and drinking   Eat a diet that includes fresh fruits and vegetables, whole grains, lean protein, and low-fat dairy.  Take vitamin and mineral supplements as recommended by your health care provider.  Do not drink alcohol if: ? Your health care provider tells you not to drink. ? You are pregnant, may be pregnant, or are planning to become pregnant.  If you drink alcohol: ? Limit how much you have to 0-1 drink a day. ? Be aware of how much alcohol is in your drink. In the [place] one drink equals one 12 oz bottle of beer (355 mL), one 5 oz glass of wine (148 mL), or one 1 oz glass of hard liquor (44 mL). Lifestyle  Take daily care of your teeth and gums.  Stay active. Exercise for at least 30 minutes on 5 or more days each week.  Do not use any products that contain nicotine or tobacco, such as cigarettes, e-cigarettes, and chewing tobacco. If you need help quitting, ask your health care provider.  If you are sexually active, practice safe sex. Use a condom or other form of birth control (contraception) in order to prevent pregnancy and STIs (sexually transmitted infections). If you plan to become pregnant, see your health care provider for a preconception visit. What's next?  Visit your health care provider once a year for a well check visit.  Ask your health care provider how often you should have your eyes and teeth checked.  Stay up to date on all vaccines. This information is not intended to replace advice given to you by your health care provider. Make sure you discuss any questions you have with your health care provider. Document Revised: 09/19/2017 Document Reviewed: 09/19/2017 Elsevier Patient Education  2020 [employer].

## 2019-02-12 NOTE — Assessment & Plan Note (Signed)
Encouraged heart healthy diet, increase exercise, avoid trans fats, consider a krill oil cap daily 

## 2019-02-12 NOTE — Assessment & Plan Note (Signed)
Encouraged DASH diet, decrease po intake and increase exercise as tolerated. Needs 7-8 hours of sleep nightly. Avoid trans fats, eat small, frequent meals every 4-5 hours with lean proteins, complex carbs and healthy fats. Minimize simple carbs, consider NOOM or [name] APP

## 2019-02-12 NOTE — Assessment & Plan Note (Signed)
Avoid offending foods, start probiotics. Do not eat large meals in late evening and consider raising head of bed.  

## 2019-02-13 MED ORDER — ATORVASTATIN CALCIUM 10 MG PO TABS: 10.0000 mg | ORAL_TABLET | Freq: Every day | ORAL | 3 refills | Status: DC

## 2019-02-13 MED ORDER — VITAMIN D (ERGOCALCIFEROL) 1.25 MG (50000 UNIT) PO CAPS: 50000.0000 [IU] | ORAL_CAPSULE | ORAL | 0 refills | Status: DC

## 2019-02-15 NOTE — Assessment & Plan Note (Signed)
Patient encouraged to maintain heart healthy diet, regular exercise, adequate sleep. Consider daily probiotics. Take medications as prescribed. Receives paps and MGMs with [facility] will obtain records.

## 2019-02-15 NOTE — Progress Notes (Signed)
Subjective:    Patient ID: Selena Flores, female    DOB: [birth year], 39 y.o.   MRN: [id number]  No chief complaint on file.   HPI Patient is in today for annual preventative exam and follow up on chronic medical concerns including hyperlipidemia, migraines and more. Her only acute concern is recent nosebleeds. She notes they have occurred over the past 3 days on the left side. No trauma. The nosebleeds have all been easy to stop with mild pressure. No history of nosebleeds or recent flare in allergic symptoms. She is maintaining quarantine and trying to eat a heart healthy diet and stay active. Denies CP/palp/SOB/HA/congestion/fevers/GI or GU c/o. Taking meds as prescribed  Past Medical History:  Diagnosis Date  . Anxiety   . Anxiety disorder 02/17/2015  . Chronic kidney disease    kidney infection 2008 ish  . Constipation   . Esophageal reflux 02/17/2015  . Food allergy    onions itching  . GERD (gastroesophageal reflux disease)   . Hyperlipidemia   . Intermittent palpitations 02/25/2018  . Melasma 02/17/2015  . Migraines   . Morbid obesity ([place]) 07/10/2017  . Morbid obesity with BMI of 45.0-49.9, adult ([place]) 02/17/2015  . 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 Plantar fasciitis 11/28/2017  . Preventative health care 10/15/2017   GYN: [facility]  . 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  . Vitamin D deficiency 11/28/2017    Past Surgical History:  Procedure Laterality Date  . CERVICAL CONE BIOPSY    . TONSILLECTOMY      Family History  Problem Relation Age of Onset  . Heart disease Mother        chf  . Obesity Mother   . Anxiety disorder Mother   . Heart failure Mother   . Skin cancer Father   . Cancer Father        skin cancer  . Heart disease Maternal Grandmother   . Heart disease Maternal Uncle     Social History   Socioeconomic History  . Marital status:  Significant Other    Spouse name: Camya Haydon  . Number of children: 0  . Years of education: Not on file  . Highest education level: Not on file  Occupational History  . Occupation: Firefighter    Comment: Customer Service   Tobacco Use  . Smoking status: Never Smoker  . Smokeless tobacco: Never Used  . Tobacco comment: smoked for 1 yr socially in college  Substance and Sexual Activity  . Alcohol use: Yes    Comment: social-occ  . Drug use: Not Currently  . Sexual activity: Yes  Other Topics Concern  . Not on file  Social History Narrative   Lives with boyfriend, has 3 dogs, works in Therapist, art with [employer]   No dietary restrictions, walking   Social Determinants of Radio broadcast assistant Strain:   . Difficulty of Paying Living Expenses: Not on file  Food Insecurity:   . Worried About Charity fundraiser in the Last Year: Not on file  . Ran Out of Food in the Last Year: Not on file  Transportation Needs:   . Lack of Transportation (Medical): Not on file  . Lack of Transportation (Non-Medical): Not on file  Physical Activity:   . Days of Exercise per Week: Not on file  . Minutes of Exercise per Session: Not on file  Stress:   .  Feeling of Stress : Not on file  Social Connections:   . Frequency of Communication with Friends and Family: Not on file  . Frequency of Social Gatherings with Friends and Family: Not on file  . Attends Religious Services: Not on file  . Active Member of Clubs or Organizations: Not on file  . Attends Banker Meetings: Not on file  . Marital Status: Not on file  Intimate Partner Violence:   . Fear of Current or Ex-Partner: Not on file  . Emotionally Abused: Not on file  . Physically Abused: Not on file  . Sexually Abused: Not on file    Outpatient Medications Prior to Visit  Medication Sig Dispense Refill  . aspirin-acetaminophen-caffeine (EXCEDRIN MIGRAINE) 250-250-65 MG tablet Take 1  tablet by mouth every 6 (six) hours as needed for headache.    . calcium carbonate (TUMS - DOSED IN MG ELEMENTAL CALCIUM) 500 MG chewable tablet Chew 1 tablet by mouth daily as needed for indigestion or heartburn.    . meloxicam (MOBIC) 15 MG tablet Take 1 tablet (15 mg total) by mouth daily as needed for pain. 30 tablet 1  . metFORMIN (GLUCOPHAGE) 500 MG tablet Take 1 tablet (500 mg total) by mouth daily with breakfast. 30 tablet 0  . Norgestimate-Ethinyl Estradiol Triphasic 0.18/0.215/0.25 MG-35 MCG tablet Take 1 tablet by mouth daily.    Marland Kitchen omeprazole (PRILOSEC) 20 MG capsule Take 20 mg by mouth daily as needed.    . sertraline (ZOLOFT) 100 MG tablet TAKE ONE-HALF TABLET BY  MOUTH IN THE MORNING AND 1  TABLET IN THE EVENING 30 tablet 0  . Vitamin D, Ergocalciferol, (DRISDOL) 1.25 MG (50000 UT) CAPS capsule Take 1 capsule (50,000 Units total) by mouth every 7 (seven) days. 4 capsule 0   No facility-administered medications prior to visit.    Allergies  Allergen Reactions  . Ciprofloxacin Itching  . Rocephin [Ceftriaxone] Itching  . Penicillins Itching and Rash    Has patient had a PCN reaction causing immediate rash, facial/tongue/throat swelling, SOB or lightheadedness with hypotension: Unknown Has patient had a PCN reaction causing severe rash involving mucus membranes or skin necrosis: Unknown Has patient had a PCN reaction that required hospitalization Unknown Has patient had a PCN reaction occurring within the last 10 years: Yes If all of the above answers are "NO", then may proceed with Cephalosporin use.     Review of Systems  Constitutional: Negative for chills, fever and malaise/fatigue.  HENT: Positive for nosebleeds. Negative for congestion and hearing loss.   Eyes: Negative for discharge.  Respiratory: Negative for cough, sputum production and shortness of breath.   Cardiovascular: Negative for chest pain, palpitations and leg swelling.  Gastrointestinal: Negative for  abdominal pain, blood in stool, constipation, diarrhea, heartburn, nausea and vomiting.  Genitourinary: Negative for dysuria, frequency, hematuria and urgency.  Musculoskeletal: Negative for back pain, falls and myalgias.  Skin: Negative for rash.  Neurological: Negative for dizziness, sensory change, loss of consciousness, weakness and headaches.  Endo/Heme/Allergies: Negative for environmental allergies. Does not bruise/bleed easily.  Psychiatric/Behavioral: Negative for depression and suicidal ideas. The patient is not nervous/anxious and does not have insomnia.        Objective:    Physical Exam Constitutional:      General: She is not in acute distress.    Appearance: She is well-developed.  HENT:     Head: Normocephalic and atraumatic.  Eyes:     Conjunctiva/sclera: Conjunctivae normal.  Neck:  Thyroid: No thyromegaly.  Cardiovascular:     Rate and Rhythm: Normal rate and regular rhythm.     Heart sounds: Normal heart sounds. No murmur.  Pulmonary:     Effort: Pulmonary effort is normal. No respiratory distress.     Breath sounds: Normal breath sounds.  Abdominal:     General: Bowel sounds are normal. There is no distension.     Palpations: Abdomen is soft. There is no mass.     Tenderness: There is no abdominal tenderness.  Musculoskeletal:     Cervical back: Neck supple.  Lymphadenopathy:     Cervical: No cervical adenopathy.  Skin:    General: Skin is warm and dry.  Neurological:     Mental Status: She is alert and oriented to person, place, and time.  Psychiatric:        Behavior: Behavior normal.     BP 114/80 (BP Location: Left Arm, Patient Position: Sitting, Cuff Size: Normal)   Pulse 85   Temp (!) 96.9 F (36.1 C) (Temporal)   Resp 18   Ht 5\' 4"  (1.626 m)   Wt 237 lb (107.5 kg)   SpO2 98%   BMI 40.68 kg/m  Wt Readings from Last 3 Encounters:  02/12/19 237 lb (107.5 kg)  09/11/18 222 lb (100.7 kg)  04/03/18 227 lb (103 kg)    Diabetic Foot  Exam - Simple   No data filed     Lab Results  Component Value Date   WBC 6.1 02/12/2019   HGB 14.3 02/12/2019   HCT 43.5 02/12/2019   PLT 314.0 02/12/2019   GLUCOSE 81 02/12/2019   CHOL 240 (H) 02/12/2019   TRIG 115.0 02/12/2019   HDL 86.80 02/12/2019   LDLCALC 130 (H) 02/12/2019   ALT 15 02/12/2019   AST 15 02/12/2019   NA 137 02/12/2019   K 5.3 (H) 02/12/2019   CL 103 02/12/2019   CREATININE 0.84 02/12/2019   BUN 23 02/12/2019   CO2 29 02/12/2019   TSH 3.38 02/12/2019   INR 0.9 05/12/2008   HGBA1C 5.2 09/11/2018    Lab Results  Component Value Date   TSH 3.38 02/12/2019   Lab Results  Component Value Date   WBC 6.1 02/12/2019   HGB 14.3 02/12/2019   HCT 43.5 02/12/2019   MCV 90.3 02/12/2019   PLT 314.0 02/12/2019   Lab Results  Component Value Date   NA 137 02/12/2019   K 5.3 (H) 02/12/2019   CO2 29 02/12/2019   GLUCOSE 81 02/12/2019   BUN 23 02/12/2019   CREATININE 0.84 02/12/2019   BILITOT 0.3 02/12/2019   ALKPHOS 89 02/12/2019   AST 15 02/12/2019   ALT 15 02/12/2019   PROT 7.2 02/12/2019   ALBUMIN 3.7 02/12/2019   CALCIUM 9.3 02/12/2019   ANIONGAP 10 04/06/2015   GFR 76.62 02/12/2019   Lab Results  Component Value Date   CHOL 240 (H) 02/12/2019   Lab Results  Component Value Date   HDL 86.80 02/12/2019   Lab Results  Component Value Date   LDLCALC 130 (H) 02/12/2019   Lab Results  Component Value Date   TRIG 115.0 02/12/2019   Lab Results  Component Value Date   CHOLHDL 3 02/12/2019   Lab Results  Component Value Date   HGBA1C 5.2 09/11/2018       Assessment & Plan:   Problem List Items Addressed This Visit    Hyperlipidemia    Encouraged heart healthy diet, increase exercise, avoid trans  fats, consider a krill oil cap daily      Relevant Medications   atorvastatin (LIPITOR) 10 MG tablet   Other Relevant Orders   Lipid panel (Completed)   TSH (Completed)   Anxiety disorder    Was doing well so she stopped therapy  and she finds she has flared a little. The Sertraline has been helpful      Relevant Medications   sertraline (ZOLOFT) 100 MG tablet   Preventative health care    Patient encouraged to maintain heart healthy diet, regular exercise, adequate sleep. Consider daily probiotics. Take medications as prescribed. Receives paps and MGMs with [facility] will obtain records.       Vitamin D deficiency    Supplement and  monitor      Relevant Medications   Vitamin D, Ergocalciferol, (DRISDOL) 1.25 MG (50000 UNIT) CAPS capsule   Other Relevant Orders   Comprehensive metabolic panel (Completed)   TSH (Completed)   VITAMIN D 25 Hydroxy (Vit-D Deficiency, Fractures) (Completed)   Esophageal reflux    Avoid offending foods, start probiotics. Do not eat large meals in late evening and consider raising head of bed.       Relevant Orders   TSH (Completed)   Morbid obesity with BMI of 45.0-49.9, adult ([facility])    Encouraged DASH diet, decrease po intake and increase exercise as tolerated. Needs 7-8 hours of sleep nightly. Avoid trans fats, eat small, frequent meals every 4-5 hours with lean proteins, complex carbs and healthy fats. Minimize simple carbs, consider NOOM or [name] APP      Relevant Orders   TSH (Completed)   Epistaxis    Mupirocin ointment to nares qhs x 7days and report worsening      Relevant Orders   CBC (Completed)      I have changed Yoshie L. Wooten's Vitamin D (Ergocalciferol). I am also having her start on mupirocin ointment and atorvastatin. Additionally, I am having her maintain her Norgestimate-Ethinyl Estradiol Triphasic, aspirin-acetaminophen-caffeine, omeprazole, calcium carbonate, meloxicam, metFORMIN, and sertraline.  Meds ordered this encounter  Medications  . DISCONTD: sertraline (ZOLOFT) 100 MG tablet    Sig: TAKE ONE-HALF TABLET BY  MOUTH IN THE MORNING AND 1  TABLET IN THE EVENING    Dispense:  30 tablet    Refill:  0    Requesting 1 year supply  .  mupirocin ointment (BACTROBAN) 2 %    Sig: Place 1 application into the nose 2 (two) times daily.    Dispense:  22 g    Refill:  0  . sertraline (ZOLOFT) 100 MG tablet    Sig: TAKE ONE-HALF TABLET BY  MOUTH IN THE MORNING AND 1  TABLET IN THE EVENING    Dispense:  90 tablet    Refill:  1    Requesting 1 year supply  . atorvastatin (LIPITOR) 10 MG tablet    Sig: Take 1 tablet (10 mg total) by mouth daily.    Dispense:  90 tablet    Refill:  3  . Vitamin D, Ergocalciferol, (DRISDOL) 1.25 MG (50000 UNIT) CAPS capsule    Sig: Take 1 capsule (50,000 Units total) by mouth every 7 (seven) days.    Dispense:  4 capsule    Refill:  0     Danise Edge, MD

## 2019-02-18 ENCOUNTER — Encounter: Payer: Self-pay | Admitting: Family Medicine

## 2019-04-10 DIAGNOSIS — Z01419 Encounter for gynecological examination (general) (routine) without abnormal findings: Secondary | ICD-10-CM | POA: Diagnosis not present

## 2019-04-10 DIAGNOSIS — Z124 Encounter for screening for malignant neoplasm of cervix: Secondary | ICD-10-CM | POA: Diagnosis not present

## 2019-04-10 DIAGNOSIS — Z6841 Body Mass Index (BMI) 40.0 and over, adult: Secondary | ICD-10-CM | POA: Diagnosis not present

## 2019-04-10 LAB — HM PAP SMEAR: HM Pap smear: NEGATIVE

## 2019-04-22 ENCOUNTER — Encounter: Payer: Self-pay | Admitting: *Deleted

## 2019-04-23 ENCOUNTER — Other Ambulatory Visit: Payer: Self-pay | Admitting: Family Medicine

## 2019-04-23 DIAGNOSIS — E559 Vitamin D deficiency, unspecified: Secondary | ICD-10-CM

## 2019-04-23 MED ORDER — VITAMIN D (ERGOCALCIFEROL) 1.25 MG (50000 UNIT) PO CAPS: 50000.0000 [IU] | ORAL_CAPSULE | ORAL | 0 refills | Status: DC

## 2019-08-13 ENCOUNTER — Ambulatory Visit: Payer: BC Managed Care – PPO | Admitting: Family Medicine

## 2019-08-25 ENCOUNTER — Encounter: Payer: Self-pay | Admitting: Family Medicine

## 2019-08-25 ENCOUNTER — Other Ambulatory Visit: Payer: Self-pay

## 2019-08-25 ENCOUNTER — Telehealth (INDEPENDENT_AMBULATORY_CARE_PROVIDER_SITE_OTHER): Payer: BC Managed Care – PPO | Admitting: Family Medicine

## 2019-08-25 VITALS — Wt 247.0 lb

## 2019-08-25 DIAGNOSIS — I8392 Asymptomatic varicose veins of left lower extremity: Secondary | ICD-10-CM

## 2019-08-25 DIAGNOSIS — M79672 Pain in left foot: Secondary | ICD-10-CM

## 2019-08-25 DIAGNOSIS — I8390 Asymptomatic varicose veins of unspecified lower extremity: Secondary | ICD-10-CM | POA: Insufficient documentation

## 2019-08-25 DIAGNOSIS — E785 Hyperlipidemia, unspecified: Secondary | ICD-10-CM

## 2019-08-25 DIAGNOSIS — M722 Plantar fascial fibromatosis: Secondary | ICD-10-CM

## 2019-08-25 DIAGNOSIS — E559 Vitamin D deficiency, unspecified: Secondary | ICD-10-CM | POA: Diagnosis not present

## 2019-08-25 MED ORDER — POLYMYXIN B-TRIMETHOPRIM 10000-0.1 UNIT/ML-% OP SOLN: 1.0000 [drp] | Freq: Four times a day (QID) | OPHTHALMIC | 0 refills | Status: DC

## 2019-08-25 NOTE — Assessment & Plan Note (Signed)
Left heel and now in the arch, comes and goes. Flared recently. Referred to podiatry for further evaluation. Ice and apply Lidocaine topically, stretch

## 2019-08-25 NOTE — Assessment & Plan Note (Signed)
Supplement and monitor 

## 2019-08-25 NOTE — Assessment & Plan Note (Signed)
Try compression hose and notify us if worsens for referral for vascular consult.

## 2019-08-25 NOTE — Assessment & Plan Note (Signed)
Encouraged heart healthy diet, increase exercise, avoid trans fats, consider a krill oil cap daily 

## 2019-08-25 NOTE — Progress Notes (Signed)
Virtual Visit via Video Note  I connected with Selena Flores on 08/25/19 at 10:20 AM EDT by a video enabled telemedicine application and verified that I am speaking with the correct person using two identifiers.  Location: Patient: home, patient and provider are both in visit Provider: home   I discussed the limitations of evaluation and management by telemedicine and the availability of in person appointments. The patient expressed understanding and agreed to proceed. Thelma BargeSheketia Richardson, CMA was able to set the patient up on video visit    Subjective:    Patient ID: Selena Flores, female    DOB: September 08, 1980, 39 y.o.   MRN: [id number]  Chief Complaint  Patient presents with  . Follow-up    HPI Patient is in today for follow up on chronic medical concerns. No recent febrile illness or hospitalizations. She had twice now a small bump that was tender to palpation develop on her left eye lid. It has resolved after warm compresses. She also notes her left heel pain had improved then suddenly the pain returned and now she pain in her arch as well. No redness or warmth. She also notes an increased varicose vein in her left calf. Mildly tender with palpation at times. Not warm or red. Denies CP/palp/SOB/HA/congestion/fevers/GI or GU c/o. Taking meds as prescribed  Past Medical History:  Diagnosis Date  . Anxiety   . Anxiety disorder 02/17/2015  . Chronic kidney disease    kidney infection 2008 ish  . Constipation   . Esophageal reflux 02/17/2015  . Food allergy    onions itching  . GERD (gastroesophageal reflux disease)   . Hyperlipidemia   . Intermittent palpitations 02/25/2018  . Melasma 02/17/2015  . Migraines   . Morbid obesity ([facility]) 07/10/2017  . Morbid obesity with BMI of 45.0-49.9, adult ([facility]) 02/17/2015  . 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 Plantar fasciitis 11/28/2017  . Preventative health care  10/15/2017   GYN: Dr Tami Ribasall[facility]  . 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  . Vitamin D deficiency 11/28/2017    Past Surgical History:  Procedure Laterality Date  . CERVICAL CONE BIOPSY    . TONSILLECTOMY      Family History  Problem Relation Age of Onset  . Heart disease Mother        chf  . Obesity Mother   . Anxiety disorder Mother   . Heart failure Mother   . Skin cancer Father   . Cancer Father        skin cancer  . Heart disease Maternal Grandmother   . Heart disease Maternal Uncle     Social History   Socioeconomic History  . Marital status: Significant Other    Spouse name: Gwinda Passedam Florio  . Number of children: 0  . Years of education: Not on file  . Highest education level: Not on file  Occupational History  . 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 Smoking status: Never Smoker  . Smokeless tobacco: Never Used  . Tobacco comment: smoked for 1 yr socially in college  Vaping Use  . Vaping Use: Never used  Substance and Sexual Activity  . Alcohol use: Yes    Comment: social-occ  . Drug use: Not Currently  . Sexual activity: Yes  Other Topics Concern  . Not on file  Social History Narrative   Lives with boyfriend, has 3 dogs, works in  customer service with [employer]   No dietary restrictions, walking   Social Determinants of Health   Financial Resource Strain:   . Difficulty of Paying Living Expenses:   Food Insecurity:   . Worried About Programme researcher, broadcasting/film/video in the Last Year:   . Barista in the Last Year:   Transportation Needs:   . Freight forwarder (Medical):   Marland Kitchen Lack of Transportation (Non-Medical):   Physical Activity:   . Days of Exercise per Week:   . Minutes of Exercise per Session:   Stress:   . Feeling of Stress :   Social Connections:   . Frequency of Communication with Friends and Family:   . Frequency of Social Gatherings with Friends and  Family:   . Attends Religious Services:   . Active Member of Clubs or Organizations:   . Attends Banker Meetings:   Marland Kitchen Marital Status:   Intimate Partner Violence:   . Fear of Current or Ex-Partner:   . Emotionally Abused:   Marland Kitchen Physically Abused:   . Sexually Abused:     Outpatient Medications Prior to Visit  Medication Sig Dispense Refill  . aspirin-acetaminophen-caffeine (EXCEDRIN MIGRAINE) 250-250-65 MG tablet Take 1 tablet by mouth every 6 (six) hours as needed for headache.    . calcium carbonate (TUMS - DOSED IN MG ELEMENTAL CALCIUM) 500 MG chewable tablet Chew 1 tablet by mouth daily as needed for indigestion or heartburn.    . Norgestimate-Ethinyl Estradiol Triphasic 0.18/0.215/0.25 MG-35 MCG tablet Take 1 tablet by mouth daily.    Marland Kitchen omeprazole (PRILOSEC) 20 MG capsule Take 20 mg by mouth daily as needed.    . sertraline (ZOLOFT) 100 MG tablet TAKE ONE-HALF TABLET BY  MOUTH IN THE MORNING AND 1  TABLET IN THE EVENING 90 tablet 1  . Vitamin D, Ergocalciferol, (DRISDOL) 1.25 MG (50000 UNIT) CAPS capsule Take 1 capsule (50,000 Units total) by mouth every 7 (seven) days. 4 capsule 0  . atorvastatin (LIPITOR) 10 MG tablet Take 1 tablet (10 mg total) by mouth daily. 90 tablet 3  . meloxicam (MOBIC) 15 MG tablet Take 1 tablet (15 mg total) by mouth daily as needed for pain. 30 tablet 1  . metFORMIN (GLUCOPHAGE) 500 MG tablet Take 1 tablet (500 mg total) by mouth daily with breakfast. 30 tablet 0  . mupirocin ointment (BACTROBAN) 2 % Place 1 application into the nose 2 (two) times daily. 22 g 0   No facility-administered medications prior to visit.    Allergies  Allergen Reactions  . Ciprofloxacin Itching  . Rocephin [Ceftriaxone] Itching  . Penicillins Itching and Rash    Has patient had a PCN reaction causing immediate rash, facial/tongue/throat swelling, SOB or lightheadedness with hypotension: Unknown Has patient had a PCN reaction causing severe rash involving mucus  membranes or skin necrosis: Unknown Has patient had a PCN reaction that required hospitalization Unknown Has patient had a PCN reaction occurring within the last 10 years: Yes If all of the above answers are "NO", then may proceed with Cephalosporin use.     Review of Systems  Constitutional: Negative for fever and malaise/fatigue.  HENT: Negative for congestion.   Eyes: Negative for blurred vision, double vision, photophobia, discharge and redness.  Respiratory: Negative for shortness of breath.   Cardiovascular: Negative for chest pain, palpitations and leg swelling.  Gastrointestinal: Negative for abdominal pain, blood in stool and nausea.  Genitourinary: Negative for dysuria and frequency.  Musculoskeletal: Positive for  joint pain. Negative for falls.  Skin: Negative for rash.  Neurological: Negative for dizziness, loss of consciousness and headaches.  Endo/Heme/Allergies: Negative for environmental allergies.  Psychiatric/Behavioral: Negative for depression. The patient is nervous/anxious.        Objective:    Physical Exam Constitutional:      Appearance: Normal appearance. She is not ill-appearing.  HENT:     Head: Normocephalic and atraumatic.     Right Ear: External ear normal.     Left Ear: External ear normal.  Eyes:     General:        Right eye: No discharge.        Left eye: No discharge.  Pulmonary:     Effort: Pulmonary effort is normal.  Neurological:     Mental Status: She is alert and oriented to person, place, and time.  Psychiatric:        Behavior: Behavior normal.     Wt 247 lb (112 kg)   LMP 08/12/2019   BMI 42.40 kg/m  Wt Readings from Last 3 Encounters:  08/25/19 247 lb (112 kg)  02/12/19 237 lb (107.5 kg)  09/11/18 222 lb (100.7 kg)    Diabetic Foot Exam - Simple   No data filed     Lab Results  Component Value Date   WBC 6.1 02/12/2019   HGB 14.3 02/12/2019   HCT 43.5 02/12/2019   PLT 314.0 02/12/2019   GLUCOSE 81 02/12/2019    CHOL 240 (H) 02/12/2019   TRIG 115.0 02/12/2019   HDL 86.80 02/12/2019   LDLCALC 130 (H) 02/12/2019   ALT 15 02/12/2019   AST 15 02/12/2019   NA 137 02/12/2019   K 5.3 (H) 02/12/2019   CL 103 02/12/2019   CREATININE 0.84 02/12/2019   BUN 23 02/12/2019   CO2 29 02/12/2019   TSH 3.38 02/12/2019   INR 0.9 05/12/2008   HGBA1C 5.2 09/11/2018    Lab Results  Component Value Date   TSH 3.38 02/12/2019   Lab Results  Component Value Date   WBC 6.1 02/12/2019   HGB 14.3 02/12/2019   HCT 43.5 02/12/2019   MCV 90.3 02/12/2019   PLT 314.0 02/12/2019   Lab Results  Component Value Date   NA 137 02/12/2019   K 5.3 (H) 02/12/2019   CO2 29 02/12/2019   GLUCOSE 81 02/12/2019   BUN 23 02/12/2019   CREATININE 0.84 02/12/2019   BILITOT 0.3 02/12/2019   ALKPHOS 89 02/12/2019   AST 15 02/12/2019   ALT 15 02/12/2019   PROT 7.2 02/12/2019   ALBUMIN 3.7 02/12/2019   CALCIUM 9.3 02/12/2019   ANIONGAP 10 04/06/2015   GFR 76.62 02/12/2019   Lab Results  Component Value Date   CHOL 240 (H) 02/12/2019   Lab Results  Component Value Date   HDL 86.80 02/12/2019   Lab Results  Component Value Date   LDLCALC 130 (H) 02/12/2019   Lab Results  Component Value Date   TRIG 115.0 02/12/2019   Lab Results  Component Value Date   CHOLHDL 3 02/12/2019   Lab Results  Component Value Date   HGBA1C 5.2 09/11/2018       Assessment & Plan:   Problem List Items Addressed This Visit    Hyperlipidemia    Encouraged heart healthy diet, increase exercise, avoid trans fats, consider a krill oil cap daily      Relevant Orders   Comprehensive metabolic panel   Lipid panel   Vitamin D deficiency  Supplement and monitor      Relevant Orders   Comprehensive metabolic panel   VITAMIN D 25 Hydroxy (Vit-D Deficiency, Fractures)   Plantar fasciitis    Left heel and now in the arch, comes and goes. Flared recently. Referred to podiatry for further evaluation. Ice and apply Lidocaine  topically, stretch      Relevant Orders   Ambulatory referral to Podiatry   Varicose vein of leg    Try compression hose and notify us if worsens for referral for vascular consult.        Other Visit Diagnoses    Arch pain of left foot    -  Primary   Relevant Orders   Ambulatory referral to Podiatry   CBC      I have discontinued Darleen L. Flores's meloxicam, metFORMIN, mupirocin ointment, and atorvastatin. I am also having her start on trimethoprim-polymyxin b. Additionally, I am having her maintain her Norgestimate-Ethinyl Estradiol Triphasic, aspirin-acetaminophen-caffeine, omeprazole, calcium carbonate, sertraline, and Vitamin D (Ergocalciferol).  Meds ordered this encounter  Medications  . trimethoprim-polymyxin b (POLYTRIM) ophthalmic solution    Sig: Place 1-2 drops into both eyes every 6 (six) hours.    Dispense:  10 mL    Refill:  0    Do not fill unless patient requests    I discussed the assessment and treatment plan with the patient. The patient was provided an opportunity to ask questions and all were answered. The patient agreed with the plan and demonstrated an understanding of the instructions.   The patient was advised to call back or seek an in-person evaluation if the symptoms worsen or if the condition fails to improve as anticipated.  I provided 20 minutes of non-face-to-face time during this encounter.   Danise Edge, MD

## 2019-09-02 ENCOUNTER — Other Ambulatory Visit: Payer: Self-pay | Admitting: Podiatry

## 2019-09-02 ENCOUNTER — Encounter: Payer: Self-pay | Admitting: Podiatry

## 2019-09-02 ENCOUNTER — Ambulatory Visit (INDEPENDENT_AMBULATORY_CARE_PROVIDER_SITE_OTHER): Payer: BC Managed Care – PPO

## 2019-09-02 ENCOUNTER — Ambulatory Visit: Payer: BC Managed Care – PPO | Admitting: Podiatry

## 2019-09-02 ENCOUNTER — Other Ambulatory Visit: Payer: Self-pay

## 2019-09-02 DIAGNOSIS — M722 Plantar fascial fibromatosis: Secondary | ICD-10-CM | POA: Diagnosis not present

## 2019-09-02 MED ORDER — DICLOFENAC SODIUM 75 MG PO TBEC: 75.0000 mg | DELAYED_RELEASE_TABLET | Freq: Two times a day (BID) | ORAL | 2 refills | Status: DC

## 2019-09-02 NOTE — Patient Instructions (Signed)

## 2019-09-03 ENCOUNTER — Other Ambulatory Visit (INDEPENDENT_AMBULATORY_CARE_PROVIDER_SITE_OTHER): Payer: BC Managed Care – PPO

## 2019-09-03 DIAGNOSIS — E559 Vitamin D deficiency, unspecified: Secondary | ICD-10-CM

## 2019-09-03 DIAGNOSIS — M79672 Pain in left foot: Secondary | ICD-10-CM | POA: Diagnosis not present

## 2019-09-03 DIAGNOSIS — E785 Hyperlipidemia, unspecified: Secondary | ICD-10-CM | POA: Diagnosis not present

## 2019-09-03 LAB — COMPREHENSIVE METABOLIC PANEL
ALT: 20 U/L (ref 0–35)
AST: 31 U/L (ref 0–37)
Albumin: 3.7 g/dL (ref 3.5–5.2)
Alkaline Phosphatase: 78 U/L (ref 39–117)
BUN: 15 mg/dL (ref 6–23)
CO2: 28 mEq/L (ref 19–32)
Calcium: 9 mg/dL (ref 8.4–10.5)
Chloride: 102 mEq/L (ref 96–112)
Creatinine, Ser: 0.77 mg/dL (ref 0.40–1.20)
GFR: 84.45 mL/min (ref >60.00)
Glucose, Bld: 86 mg/dL (ref 70–99)
Potassium: 4.3 mEq/L (ref 3.5–5.1)
Sodium: 135 mEq/L (ref 135–145)
Total Bilirubin: 0.3 mg/dL (ref 0.2–1.2)
Total Protein: 7.3 g/dL (ref 6.0–8.3)

## 2019-09-03 LAB — LIPID PANEL
Cholesterol: 232 mg/dL — ABNORMAL HIGH (ref 0–200)
HDL: 89.5 mg/dL (ref >39.00)
LDL Cholesterol: 125 mg/dL — ABNORMAL HIGH (ref 0–99)
NonHDL: 142.05
Total CHOL/HDL Ratio: 3
Triglycerides: 85 mg/dL (ref 0.0–149.0)
VLDL: 17 mg/dL (ref 0.0–40.0)

## 2019-09-03 LAB — CBC
HCT: 41.5 % (ref 36.0–46.0)
Hemoglobin: 14 g/dL (ref 12.0–15.0)
MCHC: 33.6 g/dL (ref 30.0–36.0)
MCV: 90.8 fl (ref 78.0–100.0)
Platelets: 290 10*3/uL (ref 150.0–400.0)
RBC: 4.57 Mil/uL (ref 3.87–5.11)
RDW: 14 % (ref 11.5–15.5)
WBC: 6.5 10*3/uL (ref 4.0–10.5)

## 2019-09-03 LAB — VITAMIN D 25 HYDROXY (VIT D DEFICIENCY, FRACTURES): VITD: 28.7 ng/mL — ABNORMAL LOW (ref 30.00–100.00)

## 2019-09-08 NOTE — Progress Notes (Signed)
Subjective:   Patient ID: Selena Flores, female   DOB: 39 y.o.   MRN: [id number]   HPI Patient states that she is getting pain in the bottom of the left heel and its been going on for around 5 months.  Does not remember injury and states she is not dealt with this before likes to be active and does not smoke   Review of Systems  All other systems reviewed and are negative.       Objective:  Physical Exam Vitals and nursing note reviewed.  Constitutional:      Appearance: She is well-developed.  Pulmonary:     Effort: Pulmonary effort is normal.  Musculoskeletal:        General: Normal range of motion.  Skin:    General: Skin is warm.  Neurological:     Mental Status: She is alert.     Neurovascular status intact muscle strength adequate range of motion within normal limits.  Patient is found to have exquisite discomfort plantar aspect left heel at the insertional point of the tendon into the calcaneus with inflammation fluid around the medial band that is found to have good digital perfusion well oriented x3     Assessment:  Acute plantar fasciitis left inflammation fluid at the insertional band calcaneus     Plan:  H&P condition reviewed sterile prep done and injected the fascia 3 mg Kenalog 5 mg Xylocaine and applied fascial brace to lift up the arch.  Gave instructions for physical therapy shoe gear modifications and reappoint to recheck  X-rays indicate small spur no indication stress fracture arthritis

## 2019-09-30 ENCOUNTER — Ambulatory Visit: Payer: BC Managed Care – PPO | Admitting: Podiatry

## 2019-11-18 ENCOUNTER — Other Ambulatory Visit: Payer: Self-pay

## 2019-11-18 ENCOUNTER — Telehealth: Payer: Self-pay | Admitting: Family Medicine

## 2019-11-18 DIAGNOSIS — F418 Other specified anxiety disorders: Secondary | ICD-10-CM

## 2019-11-18 MED ORDER — SERTRALINE HCL 100 MG PO TABS: ORAL_TABLET | ORAL | 1 refills | Status: DC

## 2019-11-18 NOTE — Telephone Encounter (Signed)
Medication:   sertraline (ZOLOFT) 100 MG tablet [[id number]]   Has the patient contacted their pharmacy?  (If no, request that the patient contact the pharmacy for the refill.) (If yes, when and what did the pharmacy advise?)     Preferred Pharmacy (with phone number or street name):  [facility] [place], [place] - [address] AT [address] [facility] [employer] [facility] Phone:  [phone]  Fax:  [phone]          Agent: Please be advised that RX refills may take up to 3 business days. We ask that you follow-up with your pharmacy.

## 2019-11-19 ENCOUNTER — Other Ambulatory Visit: Payer: Self-pay

## 2019-11-19 DIAGNOSIS — F418 Other specified anxiety disorders: Secondary | ICD-10-CM

## 2019-11-19 MED ORDER — SERTRALINE HCL 100 MG PO TABS: ORAL_TABLET | ORAL | 0 refills | Status: DC

## 2019-11-19 NOTE — Telephone Encounter (Signed)
  sertraline (ZOLOFT) 100 MG tablet [[id number]]     Patient sent to the wrong pharmacy , patient would like it sent to a local pharmacy  @ [facility]   [address] [place], [place] [address]   [phone]

## 2019-11-19 NOTE — Telephone Encounter (Signed)
Changes have been noted

## 2020-01-17 ENCOUNTER — Other Ambulatory Visit: Payer: Self-pay | Admitting: Family Medicine

## 2020-01-17 DIAGNOSIS — F418 Other specified anxiety disorders: Secondary | ICD-10-CM

## 2020-01-17 DIAGNOSIS — E559 Vitamin D deficiency, unspecified: Secondary | ICD-10-CM

## 2020-01-18 MED ORDER — SERTRALINE HCL 100 MG PO TABS: ORAL_TABLET | ORAL | 0 refills | Status: DC

## 2020-02-16 ENCOUNTER — Other Ambulatory Visit: Payer: Self-pay | Admitting: *Deleted

## 2020-02-16 DIAGNOSIS — F418 Other specified anxiety disorders: Secondary | ICD-10-CM

## 2020-02-16 MED ORDER — SERTRALINE HCL 100 MG PO TABS: ORAL_TABLET | ORAL | 0 refills | Status: DC

## 2020-03-10 ENCOUNTER — Other Ambulatory Visit: Payer: Self-pay

## 2020-03-10 ENCOUNTER — Ambulatory Visit (INDEPENDENT_AMBULATORY_CARE_PROVIDER_SITE_OTHER): Payer: BC Managed Care – PPO | Admitting: Family Medicine

## 2020-03-10 ENCOUNTER — Encounter: Payer: Self-pay | Admitting: Family Medicine

## 2020-03-10 VITALS — BP 106/74 | HR 88 | Temp 98.1°F | Resp 16 | Ht 64.0 in | Wt 260.2 lb

## 2020-03-10 DIAGNOSIS — Z7289 Other problems related to lifestyle: Secondary | ICD-10-CM

## 2020-03-10 DIAGNOSIS — Z0000 Encounter for general adult medical examination without abnormal findings: Secondary | ICD-10-CM | POA: Diagnosis not present

## 2020-03-10 DIAGNOSIS — E559 Vitamin D deficiency, unspecified: Secondary | ICD-10-CM

## 2020-03-10 DIAGNOSIS — L821 Other seborrheic keratosis: Secondary | ICD-10-CM

## 2020-03-10 DIAGNOSIS — K219 Gastro-esophageal reflux disease without esophagitis: Secondary | ICD-10-CM | POA: Diagnosis not present

## 2020-03-10 DIAGNOSIS — E785 Hyperlipidemia, unspecified: Secondary | ICD-10-CM

## 2020-03-10 DIAGNOSIS — F419 Anxiety disorder, unspecified: Secondary | ICD-10-CM

## 2020-03-10 LAB — CBC WITH DIFFERENTIAL/PLATELET
Basophils Absolute: 0 10*3/uL (ref 0.0–0.1)
Basophils Relative: 0.3 % (ref 0.0–3.0)
Eosinophils Absolute: 0 10*3/uL (ref 0.0–0.7)
Eosinophils Relative: 0.5 % (ref 0.0–5.0)
HCT: 42.3 % (ref 36.0–46.0)
Hemoglobin: 14.2 g/dL (ref 12.0–15.0)
Lymphocytes Relative: 22.6 % (ref 12.0–46.0)
Lymphs Abs: 1.7 10*3/uL (ref 0.7–4.0)
MCHC: 33.5 g/dL (ref 30.0–36.0)
MCV: 89.2 fl (ref 78.0–100.0)
Monocytes Absolute: 0.5 10*3/uL (ref 0.1–1.0)
Monocytes Relative: 6.2 % (ref 3.0–12.0)
Neutro Abs: 5.4 10*3/uL (ref 1.4–7.7)
Neutrophils Relative %: 70.4 % (ref 43.0–77.0)
Platelets: 269 10*3/uL (ref 150.0–400.0)
RBC: 4.74 Mil/uL (ref 3.87–5.11)
RDW: 13.5 % (ref 11.5–15.5)
WBC: 7.6 10*3/uL (ref 4.0–10.5)

## 2020-03-10 LAB — VITAMIN D 25 HYDROXY (VIT D DEFICIENCY, FRACTURES): VITD: 24.04 ng/mL — ABNORMAL LOW (ref 30.00–100.00)

## 2020-03-10 LAB — COMPREHENSIVE METABOLIC PANEL
ALT: 16 U/L (ref 0–35)
AST: 16 U/L (ref 0–37)
Albumin: 3.5 g/dL (ref 3.5–5.2)
Alkaline Phosphatase: 94 U/L (ref 39–117)
BUN: 13 mg/dL (ref 6–23)
CO2: 30 mEq/L (ref 19–32)
Calcium: 8.9 mg/dL (ref 8.4–10.5)
Chloride: 100 mEq/L (ref 96–112)
Creatinine, Ser: 0.78 mg/dL (ref 0.40–1.20)
GFR: 97.11 mL/min (ref >60.00)
Glucose, Bld: 83 mg/dL (ref 70–99)
Potassium: 4.7 mEq/L (ref 3.5–5.1)
Sodium: 135 mEq/L (ref 135–145)
Total Bilirubin: 0.3 mg/dL (ref 0.2–1.2)
Total Protein: 6.9 g/dL (ref 6.0–8.3)

## 2020-03-10 LAB — LIPID PANEL
Cholesterol: 232 mg/dL — ABNORMAL HIGH (ref 0–200)
HDL: 86.3 mg/dL (ref >39.00)
LDL Cholesterol: 119 mg/dL — ABNORMAL HIGH (ref 0–99)
NonHDL: 145.62
Total CHOL/HDL Ratio: 3
Triglycerides: 135 mg/dL (ref 0.0–149.0)
VLDL: 27 mg/dL (ref 0.0–40.0)

## 2020-03-10 LAB — TSH: TSH: 2.94 u[IU]/mL (ref 0.35–4.50)

## 2020-03-10 MED ORDER — VITAMIN D (ERGOCALCIFEROL) 1.25 MG (50000 UNIT) PO CAPS: 50000.0000 [IU] | ORAL_CAPSULE | ORAL | 4 refills | Status: DC

## 2020-03-10 NOTE — Progress Notes (Signed)
Subjective:    Patient ID: Selena Flores, female    DOB: Dec 29, 1980, 40 y.o.   MRN: [id number]  Chief Complaint  Patient presents with  . Annual Exam    HPI Patient is in today for annual preventative exam. She feels well today. No recent febrile illness or hospitalizations. She has a couple of lesions on her back that have been present for months and are stable. Lesion on abdomen has been present for years and is stable. When they become irritated they can be pruritic but are largely asymptomatic. She is trying to eat well and maintain a heart healthy diet. Tries to walk several days a week. Denies CP/palp/SOB/HA/congestion/fevers/GI or GU c/o. Taking meds as prescribed  Past Medical History:  Diagnosis Date  . Anxiety   . Anxiety disorder 02/17/2015  . Chronic kidney disease    kidney infection 2008 ish  . Constipation   . Esophageal reflux 02/17/2015  . Food allergy    onions itching  . GERD (gastroesophageal reflux disease)   . Hyperlipidemia   . Intermittent palpitations 02/25/2018  . Melasma 02/17/2015  . Migraines   . Morbid obesity (HCC) 07/10/2017  . Morbid obesity with BMI of 45.0-49.9, adult (HCC) 02/17/2015  . 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 Plantar fasciitis 11/28/2017  . Preventative health care 10/15/2017   GYN: Dr Tami Rib[facility]  . 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  . Vitamin D deficiency 11/28/2017    Past Surgical History:  Procedure Laterality Date  . CERVICAL CONE BIOPSY    . TONSILLECTOMY      Family History  Problem Relation Age of Onset  . Heart disease Mother        chf  . Obesity Mother   . Anxiety disorder Mother   . Heart failure Mother   . Skin cancer Father   . Cancer Father        skin cancer  . Heart disease Maternal Grandmother   . Heart disease Maternal Uncle     Social History   Socioeconomic History  . Marital status:  Significant Other    Spouse name: Shandiin Eisenbeis  . Number of children: 0  . Years of education: Not on file  . Highest education level: Not on file  Occupational History  . Occupation: Arts development officer    Comment: Customer Service   Tobacco Use  . Smoking status: Never Smoker  . Smokeless tobacco: Never Used  . Tobacco comment: smoked for 1 yr socially in college  Vaping Use  . Vaping Use: Never used  Substance and Sexual Activity  . Alcohol use: Yes    Comment: social-occ  . Drug use: Not Currently  . Sexual activity: Yes  Other Topics Concern  . Not on file  Social History Narrative   Lives with boyfriend, has 3 dogs, works in Clinical biochemist with [employer]   No dietary restrictions, walking   Social Determinants of Corporate investment banker Strain: Not on file  Food Insecurity: Not on file  Transportation Needs: Not on file  Physical Activity: Not on file  Stress: Not on file  Social Connections: Not on file  Intimate Partner Violence: Not on file    Outpatient Medications Prior to Visit  Medication Sig Dispense Refill  . aspirin-acetaminophen-caffeine (EXCEDRIN MIGRAINE) 250-250-65 MG tablet Take 1 tablet by mouth every 6 (six) hours as needed for headache.    . calcium carbonate (TUMS -  DOSED IN MG ELEMENTAL CALCIUM) 500 MG chewable tablet Chew 1 tablet by mouth daily as needed for indigestion or heartburn.    . Norgestimate-Ethinyl Estradiol Triphasic 0.18/0.215/0.25 MG-35 MCG tablet Take 1 tablet by mouth daily.    . sertraline (ZOLOFT) 100 MG tablet TAKE ONE-HALF TABLET BY  MOUTH IN THE MORNING AND 1  TABLET IN THE EVENING 90 tablet 0  . diclofenac (VOLTAREN) 75 MG EC tablet Take 1 tablet (75 mg total) by mouth 2 (two) times daily. 50 tablet 2  . omeprazole (PRILOSEC) 20 MG capsule Take 20 mg by mouth daily as needed.    . trimethoprim-polymyxin b (POLYTRIM) ophthalmic solution Place 1-2 drops into both eyes every 6 (six) hours. 10 mL 0  .  Vitamin D, Ergocalciferol, (DRISDOL) 1.25 MG (50000 UNIT) CAPS capsule Take 1 capsule (50,000 Units total) by mouth every 7 (seven) days. 4 capsule 0   No facility-administered medications prior to visit.    Allergies  Allergen Reactions  . Ciprofloxacin Itching  . Rocephin [Ceftriaxone] Itching  . Penicillins Itching and Rash    Has patient had a PCN reaction causing immediate rash, facial/tongue/throat swelling, SOB or lightheadedness with hypotension: Unknown Has patient had a PCN reaction causing severe rash involving mucus membranes or skin necrosis: Unknown Has patient had a PCN reaction that required hospitalization Unknown Has patient had a PCN reaction occurring within the last 10 years: Yes If all of the above answers are "NO", then may proceed with Cephalosporin use.     Review of Systems  Constitutional: Negative for chills, fever and malaise/fatigue.  HENT: Negative for congestion and hearing loss.   Eyes: Negative for discharge.  Respiratory: Negative for cough, sputum production and shortness of breath.   Cardiovascular: Negative for chest pain, palpitations and leg swelling.  Gastrointestinal: Negative for abdominal pain, blood in stool, constipation, diarrhea, heartburn, nausea and vomiting.  Genitourinary: Negative for dysuria, frequency, hematuria and urgency.  Musculoskeletal: Negative for back pain, falls and myalgias.  Skin: Negative for rash.  Neurological: Negative for dizziness, sensory change, loss of consciousness, weakness and headaches.  Endo/Heme/Allergies: Negative for environmental allergies. Does not bruise/bleed easily.  Psychiatric/Behavioral: Negative for depression and suicidal ideas. The patient is not nervous/anxious and does not have insomnia.        Objective:    Physical Exam Constitutional:      General: She is not in acute distress.    Appearance: She is not diaphoretic.  HENT:     Head: Normocephalic and atraumatic.     Right Ear:  External ear normal.     Left Ear: External ear normal.     Nose: Nose normal.     Mouth/Throat:     Mouth: Oropharynx is clear and moist.     Pharynx: No oropharyngeal exudate.  Eyes:     General: No scleral icterus.       Right eye: No discharge.        Left eye: No discharge.     Conjunctiva/sclera: Conjunctivae normal.     Pupils: Pupils are equal, round, and reactive to light.  Neck:     Thyroid: No thyromegaly.  Cardiovascular:     Rate and Rhythm: Normal rate and regular rhythm.     Pulses: Intact distal pulses.     Heart sounds: Normal heart sounds. No murmur heard.   Pulmonary:     Effort: Pulmonary effort is normal. No respiratory distress.     Breath sounds: Normal breath sounds. No wheezing  or rales.  Abdominal:     General: Bowel sounds are normal. There is no distension.     Palpations: Abdomen is soft. There is no mass.     Tenderness: There is no abdominal tenderness.  Musculoskeletal:        General: No tenderness or edema. Normal range of motion.     Cervical back: Normal range of motion and neck supple.  Lymphadenopathy:     Cervical: No cervical adenopathy.  Skin:    General: Skin is warm and dry.     Findings: No rash.     Comments: 2<1 cm lesions on back and one on abdomen. All raised, waxy and brownish gray.   Neurological:     Mental Status: She is alert and oriented to person, place, and time.     Cranial Nerves: No cranial nerve deficit.     Coordination: Coordination normal.     Deep Tendon Reflexes: Reflexes are normal and symmetric. Reflexes normal.     BP 106/74   Pulse 88   Temp 98.1 F (36.7 C)   Resp 16   Ht 5\' 4"  (1.626 m)   Wt 260 lb 3.2 oz (118 kg)   SpO2 99%   BMI 44.66 kg/m  Wt Readings from Last 3 Encounters:  03/10/20 260 lb 3.2 oz (118 kg)  08/25/19 247 lb (112 kg)  02/12/19 237 lb (107.5 kg)    Diabetic Foot Exam - Simple   No data filed    Lab Results  Component Value Date   WBC 7.6 03/10/2020   HGB 14.2  03/10/2020   HCT 42.3 03/10/2020   PLT 269.0 03/10/2020   GLUCOSE 83 03/10/2020   CHOL 232 (H) 03/10/2020   TRIG 135.0 03/10/2020   HDL 86.30 03/10/2020   LDLCALC 119 (H) 03/10/2020   ALT 16 03/10/2020   AST 16 03/10/2020   NA 135 03/10/2020   K 4.7 03/10/2020   CL 100 03/10/2020   CREATININE 0.78 03/10/2020   BUN 13 03/10/2020   CO2 30 03/10/2020   TSH 2.94 03/10/2020   INR 0.9 05/12/2008   HGBA1C 5.2 09/11/2018    Lab Results  Component Value Date   TSH 2.94 03/10/2020   Lab Results  Component Value Date   WBC 7.6 03/10/2020   HGB 14.2 03/10/2020   HCT 42.3 03/10/2020   MCV 89.2 03/10/2020   PLT 269.0 03/10/2020   Lab Results  Component Value Date   NA 135 03/10/2020   K 4.7 03/10/2020   CO2 30 03/10/2020   GLUCOSE 83 03/10/2020   BUN 13 03/10/2020   CREATININE 0.78 03/10/2020   BILITOT 0.3 03/10/2020   ALKPHOS 94 03/10/2020   AST 16 03/10/2020   ALT 16 03/10/2020   PROT 6.9 03/10/2020   ALBUMIN 3.5 03/10/2020   CALCIUM 8.9 03/10/2020   ANIONGAP 10 04/06/2015   GFR 97.11 03/10/2020   Lab Results  Component Value Date   CHOL 232 (H) 03/10/2020   Lab Results  Component Value Date   HDL 86.30 03/10/2020   Lab Results  Component Value Date   LDLCALC 119 (H) 03/10/2020   Lab Results  Component Value Date   TRIG 135.0 03/10/2020   Lab Results  Component Value Date   CHOLHDL 3 03/10/2020   Lab Results  Component Value Date   HGBA1C 5.2 09/11/2018       Assessment & Plan:   Problem List Items Addressed This Visit    Hyperlipidemia  Encouraged heart healthy diet, increase exercise, avoid trans fats, consider a krill oil cap daily      Relevant Orders   Comprehensive metabolic panel (Completed)   Lipid panel (Completed)   Anxiety disorder    Doing well on Sertraline. No changes. Has occasional attacks when driving for instance but it is rare and manageable. No longer needing therapy      Preventative health care    Patient  encouraged to maintain heart healthy diet, regular exercise, adequate sleep. Consider daily probiotics. Take medications as prescribed. Will request a copy of last pap for GYN      Relevant Orders   CBC with Differential/Platelet (Completed)   TSH (Completed)   Vitamin D deficiency    Supplement and monitor      Relevant Orders   VITAMIN D 25 Hydroxy (Vit-D Deficiency, Fractures) (Completed)   Esophageal reflux    No longer taking Omeprazole. Doing well. Avoid offending foods, start probiotics. Do not eat large meals in late evening and consider raising head of bed.       Seborrheic keratoses    2 lesions on back and one on abdomen. The one on abdomen has been present for years and unchanged. No concerning characteristics to the lesions. She will notify us and see dermatology if they change rapidly or she becomes concerned       Other Visit Diagnoses    Other problems related to lifestyle    -  Primary   Relevant Orders   Hepatitis C Antibody (Completed)      I have discontinued Chamaine L. Wooten's omeprazole, Vitamin D (Ergocalciferol), trimethoprim-polymyxin b, and diclofenac. I am also having her maintain her Norgestimate-Ethinyl Estradiol Triphasic, aspirin-acetaminophen-caffeine, calcium carbonate, and sertraline.  No orders of the defined types were placed in this encounter.    Danise Edge, MD

## 2020-03-10 NOTE — Patient Instructions (Addendum)
MIND diet or NOOM APP Preventive Care 38-38 Years Old, Female Preventive care refers to lifestyle choices and visits with your health care provider that can promote health and wellness. This includes:  A yearly physical exam. This is also called an annual wellness visit.  Regular dental and eye exams.  Immunizations.  Screening for certain conditions.  Healthy lifestyle choices, such as: ? Eating a healthy diet. ? Getting regular exercise. ? Not using drugs or products that contain nicotine and tobacco. ? Limiting alcohol use. What can I expect for my preventive care visit? Physical exam Your health care provider may check your:  Height and weight. These may be used to calculate your BMI (body mass index). BMI is a measurement that tells if you are at a healthy weight.  Heart rate and blood pressure.  Body temperature.  Skin for abnormal spots. Counseling Your health care provider may ask you questions about your:  Past medical problems.  Family's medical history.  Alcohol, tobacco, and drug use.  Emotional well-being.  Home life and relationship well-being.  Sexual activity.  Diet, exercise, and sleep habits.  Work and work Statistician.  Access to firearms.  Method of birth control.  Menstrual cycle.  Pregnancy history. What immunizations do I need? Vaccines are usually given at various ages, according to a schedule. Your health care provider will recommend vaccines for you based on your age, medical history, and lifestyle or other factors, such as travel or where you work.   What tests do I need? Blood tests  Lipid and cholesterol levels. These may be checked every 5 years starting at age 36.  Hepatitis C test.  Hepatitis B test. Screening  Diabetes screening. This is done by checking your blood sugar (glucose) after you have not eaten for a while (fasting).  STD (sexually transmitted disease) testing, if you are at risk.  BRCA-related cancer  screening. This may be done if you have a family history of breast, ovarian, tubal, or peritoneal cancers.  Pelvic exam and Pap test. This may be done every 3 years starting at age 75. Starting at age 54, this may be done every 5 years if you have a Pap test in combination with an HPV test. Talk with your health care provider about your test results, treatment options, and if necessary, the need for more tests.   Follow these instructions at home: Eating and drinking  Eat a healthy diet that includes fresh fruits and vegetables, whole grains, lean protein, and low-fat dairy products.  Take vitamin and mineral supplements as recommended by your health care provider.  Do not drink alcohol if: ? Your health care provider tells you not to drink. ? You are pregnant, may be pregnant, or are planning to become pregnant.  If you drink alcohol: ? Limit how much you have to 0-1 drink a day. ? Be aware of how much alcohol is in your drink. In the [place] one drink equals one 12 oz bottle of beer (355 mL), one 5 oz glass of wine (148 mL), or one 1 oz glass of hard liquor (44 mL).   Lifestyle  Take daily care of your teeth and gums. Brush your teeth every morning and night with fluoride toothpaste. Floss one time each day.  Stay active. Exercise for at least 30 minutes 5 or more days each week.  Do not use any products that contain nicotine or tobacco, such as cigarettes, e-cigarettes, and chewing tobacco. If you need help quitting, ask your health  care provider.  Do not use drugs.  If you are sexually active, practice safe sex. Use a condom or other form of protection to prevent STIs (sexually transmitted infections).  If you do not wish to become pregnant, use a form of birth control. If you plan to become pregnant, see your health care provider for a prepregnancy visit.  Find healthy ways to cope with stress, such as: ? Meditation, yoga, or listening to music. ? Journaling. ? Talking to a  trusted person. ? Spending time with friends and family. Safety  Always wear your seat belt while driving or riding in a vehicle.  Do not drive: ? If you have been drinking alcohol. Do not ride with someone who has been drinking. ? When you are tired or distracted. ? While texting.  Wear a helmet and other protective equipment during sports activities.  If you have firearms in your house, make sure you follow all gun safety procedures.  Seek help if you have been physically or sexually abused. What's next?  Go to your health care provider once a year for an annual wellness visit.  Ask your health care provider how often you should have your eyes and teeth checked.  Stay up to date on all vaccines. This information is not intended to replace advice given to you by your health care provider. Make sure you discuss any questions you have with your health care provider. Document Revised: 09/06/2019 Document Reviewed: 09/19/2017 Elsevier Patient Education  2021 [employer].

## 2020-03-10 NOTE — Assessment & Plan Note (Signed)
Encouraged heart healthy diet, increase exercise, avoid trans fats, consider a krill oil cap daily 

## 2020-03-10 NOTE — Assessment & Plan Note (Signed)
Supplement and monitor 

## 2020-03-10 NOTE — Assessment & Plan Note (Addendum)
Patient encouraged to maintain heart healthy diet, regular exercise, adequate sleep. Consider daily probiotics. Take medications as prescribed. Will request a copy of last pap for GYN

## 2020-03-10 NOTE — Assessment & Plan Note (Addendum)
Doing well on Sertraline. No changes. Has occasional attacks when driving for instance but it is rare and manageable. No longer needing therapy

## 2020-03-10 NOTE — Assessment & Plan Note (Signed)
No longer taking Omeprazole. Doing well. Avoid offending foods, start probiotics. Do not eat large meals in late evening and consider raising head of bed.

## 2020-03-11 LAB — HEPATITIS C ANTIBODY
Hepatitis C Ab: NONREACTIVE
SIGNAL TO CUT-OFF: 0.01 (ref <1.00)

## 2020-03-13 DIAGNOSIS — L821 Other seborrheic keratosis: Secondary | ICD-10-CM | POA: Insufficient documentation

## 2020-03-13 NOTE — Assessment & Plan Note (Signed)
2 lesions on back and one on abdomen. The one on abdomen has been present for years and unchanged. No concerning characteristics to the lesions. She will notify us and see dermatology if they change rapidly or she becomes concerned

## 2020-04-07 ENCOUNTER — Other Ambulatory Visit: Payer: Self-pay | Admitting: Family Medicine

## 2020-04-07 DIAGNOSIS — F418 Other specified anxiety disorders: Secondary | ICD-10-CM

## 2020-05-02 DIAGNOSIS — Z01419 Encounter for gynecological examination (general) (routine) without abnormal findings: Secondary | ICD-10-CM | POA: Diagnosis not present

## 2020-05-02 DIAGNOSIS — Z6841 Body Mass Index (BMI) 40.0 and over, adult: Secondary | ICD-10-CM | POA: Diagnosis not present

## 2020-09-20 ENCOUNTER — Ambulatory Visit: Payer: BC Managed Care – PPO | Admitting: Medical

## 2020-09-20 ENCOUNTER — Other Ambulatory Visit: Payer: Self-pay

## 2020-09-20 VITALS — BP 128/72 | HR 100 | Temp 98.1°F | Resp 18 | Ht 64.0 in | Wt 269.8 lb

## 2020-09-20 DIAGNOSIS — R0981 Nasal congestion: Secondary | ICD-10-CM

## 2020-09-20 DIAGNOSIS — J0100 Acute maxillary sinusitis, unspecified: Secondary | ICD-10-CM | POA: Diagnosis not present

## 2020-09-20 MED ORDER — AZITHROMYCIN 250 MG PO TABS: ORAL_TABLET | ORAL | 0 refills | Status: AC

## 2020-09-20 MED ORDER — FLUTICASONE PROPIONATE 50 MCG/ACT NA SUSP: 2.0000 | Freq: Every day | NASAL | 1 refills | Status: DC

## 2020-09-20 MED ORDER — BENZONATATE 100 MG PO CAPS: 100.0000 mg | ORAL_CAPSULE | Freq: Three times a day (TID) | ORAL | 0 refills | Status: DC | PRN

## 2020-09-20 NOTE — Patient Instructions (Addendum)
For 2 weeks of nasal congestion with maxillary sinus presssure now. Considering allergic rhinitis with secondary sinus infection.   Rx benzonatate for cough. Rx azithromycin for sinus infection. Rx flonase for nasal congestion. Can use zyrtec as well.  Follow up 10-14 days or as needed.

## 2020-09-20 NOTE — Progress Notes (Signed)
Subjective:    Patient ID: Selena Flores, female    DOB: Dec 05, 1980, 40 y.o.   MRN: [id number]  HPI  Pt in for symptoms for 2 weeks. She has cough and pnd at night. When she coughs not bringing up mucus except for morning. No fever, no chills or sweats. Mild fatigue as notes not sleeping well. No wheezing. Mild  sinus pain. Pt over past 2 weeks has taken about 4 covid test otc and all have been negative.   Having minimal sneezing.   LMP- 2-3 weeks ago.   Pt tried claritin, mucinex and nyquil. Nothing has really helped much.   No heart burn reported.  In the past around this time of year thinks has occasional fall allergis.  Review of Systems  Constitutional:  Negative for chills, fatigue and fever.  HENT:  Negative for congestion, ear discharge and ear pain.   Respiratory:  Positive for cough. Negative for chest tightness, shortness of breath and wheezing.   Cardiovascular:  Negative for chest pain and palpitations.  Gastrointestinal:  Negative for abdominal pain.  Genitourinary:  Negative for dysuria and enuresis.  Musculoskeletal:  Negative for back pain.    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   Patellofemoral syndrome 02/17/2015   Plantar fasciitis 11/28/2017   Preventative health care 10/15/2017   GYN: Dr Tami Ribas [facility]   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   Vitamin D deficiency 11/28/2017     Social History   Socioeconomic History   Marital status: Significant Other     Spouse name: Imanie Darrow   Number of children: 0   Years of education: Not on file   Highest education level: Not on file  Occupational History   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   Tobacco Use   Smoking status: Never   Smokeless tobacco: Never   Tobacco comments:    smoked for 1 yr socially in college  Vaping Use   Vaping Use: Never used  Substance and Sexual Activity   Alcohol use: Yes    Comment: social-occ   Drug use: Not Currently   Sexual activity: Yes  Other Topics Concern   Not on file  Social History Narrative   Lives with boyfriend, has 3 dogs, works in Clinical biochemist with [employer]   No dietary restrictions, walking   Social Determinants of Corporate investment banker Strain: Not on file  Food Insecurity: Not on file  Transportation Needs: Not on file  Physical Activity: Not on file  Stress: Not on file  Social Connections: Not on file  Intimate Partner Violence: Not on file    Past Surgical History:  Procedure Laterality Date   CERVICAL CONE BIOPSY     TONSILLECTOMY      Family History  Problem Relation Age of Onset   Heart disease Mother        chf   Obesity Mother    Anxiety disorder Mother  Heart failure Mother    Skin cancer Father    Cancer Father        skin cancer   Heart disease Maternal Grandmother    Heart disease Maternal Uncle     Allergies  Allergen Reactions   Ciprofloxacin Itching   Rocephin [Ceftriaxone] Itching   Penicillins Itching and Rash    Has patient had a PCN reaction causing immediate rash, facial/tongue/throat swelling, SOB or lightheadedness with hypotension: Unknown Has patient had a PCN reaction causing severe rash involving mucus membranes or skin necrosis: Unknown Has patient had a PCN reaction that required hospitalization Unknown Has patient had a PCN reaction occurring within the last 10 years: Yes If all of the above answers are "NO", then may proceed with  Cephalosporin use.     Current Outpatient Medications on File Prior to Visit  Medication Sig Dispense Refill   aspirin-acetaminophen-caffeine (EXCEDRIN MIGRAINE) 250-250-65 MG tablet Take 1 tablet by mouth every 6 (six) hours as needed for headache.     calcium carbonate (TUMS - DOSED IN MG ELEMENTAL CALCIUM) 500 MG chewable tablet Chew 1 tablet by mouth daily as needed for indigestion or heartburn.     Norgestimate-Ethinyl Estradiol Triphasic 0.18/0.215/0.25 MG-35 MCG tablet Take 1 tablet by mouth daily.     sertraline (ZOLOFT) 100 MG tablet TAKE 1/2 TABLET EVERY MORNING AND TAKE 1 TABLET  EVERY EVENING 135 tablet 1   Vitamin D, Ergocalciferol, (DRISDOL) 1.25 MG (50000 UNIT) CAPS capsule Take 1 capsule (50,000 Units total) by mouth every 7 (seven) days. 4 capsule 4   No current facility-administered medications on file prior to visit.    BP 128/72 (BP Location: Left Arm, Patient Position: Sitting, Cuff Size: Large)   Pulse 100   Temp 98.1 F (36.7 C) (Oral)   Resp 18   Ht 5\' 4"  (1.626 m)   Wt 269 lb 12.8 oz (122.4 kg)   SpO2 97%   BMI 46.31 kg/m       Objective:   Physical Exam  General Mental Status- Alert. General Appearance- Not in acute distress.   Skin General: Color- Normal Color. Moisture- Normal Moisture.  Neck Carotid Arteries- Normal color. Moisture- Normal Moisture. No carotid bruits. No JVD.  Chest and Lung Exam Auscultation: Breath Sounds:-Normal.  Cardiovascular Auscultation:Rythm- Regular. Murmurs & Other Heart Sounds:Auscultation of the heart reveals- No Murmurs.   Neurologic Cranial Nerve exam:- CN III-XII intact(No nystagmus), symmetric smile. Strength:- 5/5 equal and symmetric strength both upper and lower extremities.   Heent- left tm mild central dullness. Maxillary sinus pressure mild. Pnd present.      Assessment & Plan:   For 2 weeks of nasal congestion with maxillary sinus presssure now. Considering allergic rhinitis with secondary  sinus infection.   Rx benzonatate for cough. Rx azithromycin for sinus infection. Rx flonase for nasal congestion. Can use zyrtec as well.  Follow up 10-14 days or as needed

## 2020-09-21 ENCOUNTER — Other Ambulatory Visit: Payer: Self-pay | Admitting: Family Medicine

## 2020-09-21 DIAGNOSIS — F418 Other specified anxiety disorders: Secondary | ICD-10-CM

## 2020-10-07 ENCOUNTER — Encounter: Payer: Self-pay | Admitting: Family Medicine

## 2020-10-07 DIAGNOSIS — E785 Hyperlipidemia, unspecified: Secondary | ICD-10-CM

## 2020-10-07 DIAGNOSIS — R251 Tremor, unspecified: Secondary | ICD-10-CM

## 2020-10-07 DIAGNOSIS — R739 Hyperglycemia, unspecified: Secondary | ICD-10-CM

## 2020-10-13 ENCOUNTER — Other Ambulatory Visit: Payer: Self-pay

## 2020-10-13 ENCOUNTER — Other Ambulatory Visit (INDEPENDENT_AMBULATORY_CARE_PROVIDER_SITE_OTHER): Payer: BC Managed Care – PPO

## 2020-10-13 DIAGNOSIS — R739 Hyperglycemia, unspecified: Secondary | ICD-10-CM

## 2020-10-13 DIAGNOSIS — E785 Hyperlipidemia, unspecified: Secondary | ICD-10-CM | POA: Diagnosis not present

## 2020-10-13 DIAGNOSIS — R251 Tremor, unspecified: Secondary | ICD-10-CM | POA: Diagnosis not present

## 2020-10-13 LAB — LIPID PANEL
Cholesterol: 243 mg/dL — ABNORMAL HIGH (ref 0–200)
HDL: 83 mg/dL (ref >39.00)
LDL Cholesterol: 132 mg/dL — ABNORMAL HIGH (ref 0–99)
NonHDL: 160.49
Total CHOL/HDL Ratio: 3
Triglycerides: 141 mg/dL (ref 0.0–149.0)
VLDL: 28.2 mg/dL (ref 0.0–40.0)

## 2020-10-13 LAB — COMPREHENSIVE METABOLIC PANEL
ALT: 11 U/L (ref 0–35)
AST: 15 U/L (ref 0–37)
Albumin: 3.4 g/dL — ABNORMAL LOW (ref 3.5–5.2)
Alkaline Phosphatase: 92 U/L (ref 39–117)
BUN: 18 mg/dL (ref 6–23)
CO2: 29 mEq/L (ref 19–32)
Calcium: 8.9 mg/dL (ref 8.4–10.5)
Chloride: 102 mEq/L (ref 96–112)
Creatinine, Ser: 0.8 mg/dL (ref 0.40–1.20)
GFR: 93.82 mL/min (ref >60.00)
Glucose, Bld: 81 mg/dL (ref 70–99)
Potassium: 4.7 mEq/L (ref 3.5–5.1)
Sodium: 138 mEq/L (ref 135–145)
Total Bilirubin: 0.3 mg/dL (ref 0.2–1.2)
Total Protein: 6.9 g/dL (ref 6.0–8.3)

## 2020-10-13 LAB — CBC WITH DIFFERENTIAL/PLATELET
Basophils Absolute: 0 10*3/uL (ref 0.0–0.1)
Basophils Relative: 0.4 % (ref 0.0–3.0)
Eosinophils Absolute: 0.1 10*3/uL (ref 0.0–0.7)
Eosinophils Relative: 1 % (ref 0.0–5.0)
HCT: 41.3 % (ref 36.0–46.0)
Hemoglobin: 13.5 g/dL (ref 12.0–15.0)
Lymphocytes Relative: 27 % (ref 12.0–46.0)
Lymphs Abs: 1.6 10*3/uL (ref 0.7–4.0)
MCHC: 32.7 g/dL (ref 30.0–36.0)
MCV: 90.1 fl (ref 78.0–100.0)
Monocytes Absolute: 0.4 10*3/uL (ref 0.1–1.0)
Monocytes Relative: 7.1 % (ref 3.0–12.0)
Neutro Abs: 3.9 10*3/uL (ref 1.4–7.7)
Neutrophils Relative %: 64.5 % (ref 43.0–77.0)
Platelets: 292 10*3/uL (ref 150.0–400.0)
RBC: 4.58 Mil/uL (ref 3.87–5.11)
RDW: 13.6 % (ref 11.5–15.5)
WBC: 6 10*3/uL (ref 4.0–10.5)

## 2020-10-13 LAB — HEMOGLOBIN A1C: Hgb A1c MFr Bld: 5.4 % (ref 4.6–6.5)

## 2020-10-13 LAB — TSH: TSH: 3.68 u[IU]/mL (ref 0.35–5.50)

## 2021-02-28 ENCOUNTER — Other Ambulatory Visit: Payer: Self-pay

## 2021-02-28 ENCOUNTER — Telehealth (INDEPENDENT_AMBULATORY_CARE_PROVIDER_SITE_OTHER): Payer: BC Managed Care – PPO | Admitting: Family

## 2021-02-28 ENCOUNTER — Telehealth: Payer: Self-pay | Admitting: Family

## 2021-02-28 ENCOUNTER — Ambulatory Visit (HOSPITAL_BASED_OUTPATIENT_CLINIC_OR_DEPARTMENT_OTHER)
Admission: RE | Admit: 2021-02-28 | Discharge: 2021-02-28 | Disposition: A | Discharge status: Home / Self Care | Payer: BC Managed Care – PPO | Source: Ambulatory Visit | Attending: Family | Admitting: Family

## 2021-02-28 DIAGNOSIS — R918 Other nonspecific abnormal finding of lung field: Secondary | ICD-10-CM | POA: Diagnosis not present

## 2021-02-28 DIAGNOSIS — J40 Bronchitis, not specified as acute or chronic: Secondary | ICD-10-CM | POA: Diagnosis not present

## 2021-02-28 DIAGNOSIS — R059 Cough, unspecified: Secondary | ICD-10-CM

## 2021-02-28 MED ORDER — AZITHROMYCIN 250 MG PO TABS: ORAL_TABLET | ORAL | 0 refills | Status: AC

## 2021-02-28 MED ORDER — BENZONATATE 100 MG PO CAPS: 100.0000 mg | ORAL_CAPSULE | Freq: Three times a day (TID) | ORAL | 0 refills | Status: DC | PRN

## 2021-02-28 NOTE — Assessment & Plan Note (Signed)
New. Will obtain CXR to rule out PNA. Rx for zpak, tessalon prn. Pt is advised to schedule in person evaluation if symptoms are not improved in 2-3 days.

## 2021-02-28 NOTE — Patient Instructions (Signed)
Please complete chest x-ray at the [facility] Imaging Department. Start zpak (antibiotics). You may use tessalon as needed for cough.

## 2021-02-28 NOTE — Telephone Encounter (Signed)
Spoke to patient. Reviewed CXR results. She is allergic to cephalosporins/penicillins and fluoroquinolones which limits our treatment options. Will plan to continue her on azithromycin, she will reach out to [place] if symptoms worsen or if they fail to improve. 1 week follow up visit has been scheduled.

## 2021-02-28 NOTE — Progress Notes (Signed)
Subjective:     Patient ID: Selena Flores, female    DOB: Jan 31, 1980, 41 y.o.   MRN: [id number]  Chief Complaint  Patient presents with   Cough    Complains of productive cough   Generalized Body Aches    Complains of some body aches yesterday     Cough   Productive cough, body aches. Began on 1/26.  Thinks she had a fever on 1/27.  Cough was green/yellow mucous. Had rattling in her chest when she would breath at night.  By Sunday- nose was "running all day."  Monday cough and nasal congestion seemed to be getting better.  Mucous cleared up.  This past Sunday the cough came back "really bad." Chest hurts from coughing.  Temp 99. Green/yellow/brown mucous. She has been using mucinex prn, tylenol cold.  Tested yesterday for covid and was negative.    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   Patellofemoral syndrome 02/17/2015   Plantar fasciitis 11/28/2017   Preventative health care 10/15/2017   GYN: Dr Tami Ribas [facility]   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   Vitamin D deficiency 11/28/2017    Past Surgical History:  Procedure Laterality Date   CERVICAL CONE BIOPSY     TONSILLECTOMY      Family History  Problem Relation Age of Onset   Heart disease Mother        chf   Obesity Mother    Anxiety disorder Mother    Heart failure Mother    Skin  cancer Father    Cancer Father        skin cancer   Heart disease Maternal Grandmother    Heart disease Maternal Uncle     Social History   Socioeconomic History   Marital status: Married    Spouse name: Sultana Tierney   Number of children: 0   Years of education: Not on file   Highest education level: Not on file  Occupational History   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   Tobacco Use   Smoking status: Never   Smokeless tobacco: Never   Tobacco comments:    smoked for 1 yr socially in college  Vaping Use   Vaping Use: Never used  Substance and Sexual Activity   Alcohol use: Yes    Comment: social-occ   Drug use: Not Currently   Sexual activity: Yes  Other Topics Concern   Not on file  Social History Narrative   Lives with boyfriend, has 3 dogs, 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     calcium carbonate (TUMS - DOSED IN MG ELEMENTAL CALCIUM) 500 MG chewable tablet Chew 1 tablet by mouth daily as needed for indigestion or heartburn.     Norgestimate-Ethinyl Estradiol Triphasic 0.18/0.215/0.25 MG-35 MCG tablet Take 1 tablet by mouth daily.     sertraline (ZOLOFT) 100 MG tablet TAKE 1/2 TABLET EVERY      MORNING AND 1 TABLET EVERY EVENING 135 tablet 1   benzonatate (TESSALON) 100 MG capsule Take 1 capsule (100 mg total) by mouth 3 (three) times daily as needed for cough. 30 capsule 0   fluticasone (FLONASE) 50 MCG/ACT  nasal spray Place 2 sprays into both nostrils daily. 16 g 1   Vitamin D, Ergocalciferol, (DRISDOL) 1.25 MG (50000 UNIT) CAPS capsule Take 1 capsule (50,000 Units total) by mouth every 7 (seven) days. 4 capsule 4   No facility-administered medications prior to visit.    Allergies  Allergen Reactions   Ciprofloxacin Itching   Rocephin [Ceftriaxone] Itching   Penicillins Itching and Rash    Has patient had a PCN reaction causing immediate rash, facial/tongue/throat swelling, SOB or lightheadedness with hypotension: Unknown Has patient had a PCN reaction causing severe rash involving mucus membranes or skin necrosis: Unknown Has patient had a PCN reaction that required hospitalization Unknown Has patient had a PCN reaction occurring within the last 10 years: Yes If all of the above answers are "NO", then may proceed with Cephalosporin use.     Review of Systems  Respiratory:  Positive for cough.      See HPI Objective:    Physical Exam Constitutional:      General: She is not in acute distress.    Appearance: Normal appearance. She is well-developed.  HENT:     Head: Normocephalic and atraumatic.     Right Ear: External ear normal.     Left Ear: External ear normal.  Eyes:     General: No scleral icterus. Neck:     Thyroid: No thyromegaly.  Cardiovascular:     Heart sounds: No murmur heard. Pulmonary:     Effort: Pulmonary effort is normal.  Musculoskeletal:     Cervical back: Neck supple.  Neurological:     Mental Status: She is alert and oriented to person, place, and time.  Psychiatric:        Mood and Affect: Mood normal.        Behavior: Behavior normal.        Thought Content: Thought content normal.        Judgment: Judgment normal.    There were no vitals taken for this visit. Wt Readings from Last 3 Encounters:  09/20/20 269 lb 12.8 oz (122.4 kg)  03/10/20 260 lb 3.2 oz (118 kg)  08/25/19 247 lb (112 kg)       Assessment & Plan:   Problem List Items  Addressed This Visit       Unprioritized   Bronchitis    New. Will obtain CXR to rule out PNA. Rx for zpak, tessalon prn. Pt is advised to schedule in person evaluation if symptoms are not improved in 2-3 days.        Other Visit Diagnoses     Cough, unspecified  type    -  Primary   Relevant Orders   DG Chest 2 View       I have discontinued Evetta L. Wooten's Vitamin D (Ergocalciferol), benzonatate, and fluticasone. I am also having her start on azithromycin and benzonatate. Additionally, I am having her maintain her Norgestimate-Ethinyl Estradiol Triphasic, aspirin-acetaminophen-caffeine, calcium carbonate, and sertraline.  Meds ordered this encounter  Medications   azithromycin (ZITHROMAX) 250 MG tablet    Sig: Take 2 tablets on day 1, then 1 tablet daily on days 2 through 5    Dispense:  6 tablet    Refill:  0    Order Specific Question:   Supervising Provider    Answer:   Danise Edge A [[id number]]   benzonatate (TESSALON) 100 MG capsule    Sig: Take 1 capsule (100 mg total) by mouth 3 (three) times daily as needed.    Dispense:  20 capsule    Refill:  0    Order Specific Question:   Supervising Provider    Answer:   Danise Edge A [[id number]]

## 2021-03-07 ENCOUNTER — Other Ambulatory Visit: Payer: Self-pay

## 2021-03-07 ENCOUNTER — Ambulatory Visit (INDEPENDENT_AMBULATORY_CARE_PROVIDER_SITE_OTHER): Payer: BC Managed Care – PPO | Admitting: Family

## 2021-03-07 ENCOUNTER — Ambulatory Visit (HOSPITAL_BASED_OUTPATIENT_CLINIC_OR_DEPARTMENT_OTHER)
Admission: RE | Admit: 2021-03-07 | Discharge: 2021-03-07 | Disposition: A | Discharge status: Home / Self Care | Payer: BC Managed Care – PPO | Source: Ambulatory Visit | Attending: Family | Admitting: Family

## 2021-03-07 VITALS — BP 110/68 | HR 94 | Temp 98.1°F | Resp 16 | Wt 274.0 lb

## 2021-03-07 DIAGNOSIS — J189 Pneumonia, unspecified organism: Secondary | ICD-10-CM | POA: Diagnosis not present

## 2021-03-07 MED ORDER — ALBUTEROL SULFATE HFA 108 (90 BASE) MCG/ACT IN AERS: 2.0000 | INHALATION_SPRAY | Freq: Four times a day (QID) | RESPIRATORY_TRACT | 0 refills | Status: DC | PRN

## 2021-03-07 NOTE — Patient Instructions (Addendum)
Please complete chest x-ray on the first floor. You may use albuterol every 6 hours as needed for cough/wheezing

## 2021-03-07 NOTE — Progress Notes (Signed)
Subjective:     Patient ID: Selena Flores, female    DOB: [birth year], 41 y.o.   MRN: [id number]  Chief Complaint  Patient presents with   Pneumonia    Here for follow up, " a little better but still congestion and cough"    HPI Patient is in today for follow up.  Pt was seen on 02/28/21 for a video visit with c/o cough.  CXR was performed and noted PNA at the right base. Patient has multiple antibiotic drug allergies so she was placed on azithromycin.  She reports that cough comes and goes.  Cough is coming from post nasal drip. Nasal drainage is clear.  She denies recent fever.    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   Patellofemoral syndrome 02/17/2015   Plantar fasciitis 11/28/2017   Preventative health care 10/15/2017   GYN: Dr Tami Ribas [facility]   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   Vitamin D deficiency 11/28/2017    Past Surgical History:  Procedure Laterality Date   CERVICAL CONE BIOPSY     TONSILLECTOMY      Family History  Problem Relation Age of Onset   Heart disease Mother        chf   Obesity Mother    Anxiety disorder Mother    Heart failure Mother    Skin cancer Father    Cancer Father        skin cancer   Heart disease Maternal Grandmother    Heart disease Maternal Uncle     Social History   Socioeconomic History    Marital status: Married    Spouse name: Nikitta Sobiech   Number of children: 0   Years of education: Not on file   Highest education level: Not on file  Occupational History   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   Tobacco Use   Smoking status: Never   Smokeless tobacco: Never   Tobacco comments:    smoked for 1 yr socially in college  Vaping Use   Vaping Use: Never used  Substance and Sexual Activity   Alcohol use: Yes    Comment: social-occ   Drug use: Not Currently   Sexual activity: Yes  Other Topics Concern   Not on file  Social History Narrative   Lives with boyfriend, has 3 dogs, 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     benzonatate (TESSALON) 100 MG capsule Take 1 capsule (100 mg total) by mouth 3 (three) times daily as needed. 20 capsule 0   calcium carbonate (TUMS - DOSED IN MG ELEMENTAL CALCIUM) 500 MG chewable tablet Chew 1 tablet by mouth daily as needed for indigestion or heartburn.     Norgestimate-Ethinyl Estradiol Triphasic 0.18/0.215/0.25 MG-35 MCG tablet Take 1 tablet by mouth daily.     sertraline (ZOLOFT) 100 MG tablet TAKE 1/2 TABLET EVERY      MORNING AND 1 TABLET EVERY EVENING 135 tablet 1   No facility-administered medications prior to visit.    Allergies  Allergen Reactions   Ciprofloxacin Itching   Rocephin [Ceftriaxone] Itching   Penicillins Itching and Rash    Has patient had a PCN reaction  causing immediate rash, facial/tongue/throat swelling, SOB or lightheadedness with hypotension: Unknown Has patient had a PCN reaction causing severe rash involving mucus membranes or skin necrosis: Unknown Has patient had a PCN reaction that required hospitalization Unknown Has patient had a PCN reaction occurring within the last 10 years: Yes If all of the above answers are "NO", then may proceed with Cephalosporin use.     ROS See HPI    Objective:    Physical Exam Constitutional:      General: She is not in acute distress.    Appearance: Normal appearance. She is well-developed.  HENT:     Head: Normocephalic and atraumatic.     Right Ear: External ear normal.     Left Ear: External ear normal.  Eyes:     General: No scleral icterus. Neck:     Thyroid: No thyromegaly.  Cardiovascular:     Rate and Rhythm: Normal rate and regular rhythm.     Heart sounds: Normal heart sounds. No murmur heard. Pulmonary:     Effort: Pulmonary effort is normal. No respiratory distress.     Breath sounds: Examination of the right-lower field reveals wheezing. Wheezing present.  Musculoskeletal:     Cervical back: Neck supple.  Skin:    General: Skin is warm and dry.  Neurological:     Mental Status: She is alert and oriented to person, place, and time.  Psychiatric:        Mood and Affect: Mood normal.        Behavior: Behavior normal.        Thought Content: Thought content normal.        Judgment: Judgment normal.    BP 110/68 (BP Location: Right Arm, Patient Position: Sitting, Cuff Size: Large)    Pulse 94    Temp 98.1 F (36.7 C) (Oral)    Resp 16    Wt 274 lb (124.3 kg)    LMP 02/06/2021    SpO2 100%    BMI 47.03 kg/m  Wt Readings from Last 3 Encounters:  03/07/21 274 lb (124.3 kg)  09/20/20 269 lb 12.8 oz (122.4 kg)  03/10/20 260 lb 3.2 oz (118 kg)       Assessment & Plan:   Problem List Items Addressed This Visit       Unprioritized   Pneumonia of right lower lobe  due to infectious organism - Primary    Follow up CXR shows resolution of PNA. She does have some wheezing RLL on exam. Recommended albuterol 2 puffs every 6 hours for the next few days. In addition,  recommended nasal saline spray for nasal congestion.  Call if symptoms do not continue to improve.       Relevant Medications   albuterol (VENTOLIN HFA) 108 (90 Base) MCG/ACT inhaler   Other Relevant Orders   DG Chest 2 View (Completed)    I am having Jerrilyn L. Wooten start on albuterol. I am also having her maintain her Norgestimate-Ethinyl Estradiol Triphasic, aspirin-acetaminophen-caffeine, calcium carbonate, sertraline, and benzonatate.  Meds ordered this encounter  Medications   albuterol (VENTOLIN HFA) 108 (90 Base) MCG/ACT inhaler    Sig: Inhale 2 puffs into the lungs every 6 (six) hours as needed for wheezing or shortness of breath.    Dispense:  8 g    Refill:  0    Order Specific Question:   Supervising Provider    Answer:   Danise Edge A [[id number]]

## 2021-03-08 DIAGNOSIS — J189 Pneumonia, unspecified organism: Secondary | ICD-10-CM | POA: Insufficient documentation

## 2021-03-08 NOTE — Assessment & Plan Note (Signed)
Follow up CXR shows resolution of PNA. She does have some wheezing RLL on exam. Recommended albuterol 2 puffs every 6 hours for the next few days. In addition, recommended nasal saline spray for nasal congestion.  Call if symptoms do not continue to improve.

## 2021-03-13 ENCOUNTER — Encounter: Payer: BC Managed Care – PPO | Admitting: Family Medicine

## 2021-03-27 ENCOUNTER — Other Ambulatory Visit: Payer: Self-pay | Admitting: Family Medicine

## 2021-03-27 DIAGNOSIS — F418 Other specified anxiety disorders: Secondary | ICD-10-CM

## 2021-04-17 NOTE — Progress Notes (Signed)
? ?Subjective:  ? ? Patient ID: Selena Flores, female    DOB: Dec 23, 1980, 41 y.o.   MRN: [id number] ? ?Chief Complaint  ?Patient presents with  ? Annual Exam  ? ? ?HPI ?Patient is in today for her annual physical exam. She feels well today. No recent febrile illness or hospitalizations. She has recently gotten married and is happy. She is trying to increase her activity level and to maintain a heart healthy diet. Denies CP/palp/SOB/HA/fevers/GI or GU c/o. Taking meds as prescribed. Does note some recent congestion and she has had recent pneumonia but she feels that has resolved.  ? ?Past Medical History:  ?Diagnosis Date  ? Allergy   ? Anxiety   ? Anxiety disorder 02/17/2015  ? Chronic kidney disease   ? kidney infection 2008 ish  ? Constipation   ? Depression   ? Esophageal reflux 02/17/2015  ? Food allergy   ? onions itching  ? GERD (gastroesophageal reflux disease)   ? Hyperlipidemia   ? Intermittent palpitations 02/25/2018  ? Melasma 02/17/2015  ? Migraines   ? Morbid obesity (HCC) 07/10/2017  ? Morbid obesity with BMI of 45.0-49.9, adult (HCC) 02/17/2015  ? 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 Plantar fasciitis 11/28/2017  ? Preventative health care 10/15/2017  ? GYN: Dr Tami Ribas [facility]  ? 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  ? Vitamin D deficiency 11/28/2017  ? ? ?Past Surgical History:  ?Procedure Laterality Date  ? CERVICAL CONE BIOPSY    ? TONSILLECTOMY    ? ? ?Family History  ?Problem Relation Age of Onset  ? Heart disease Mother   ?     chf  ? Obesity Mother   ? Anxiety disorder Mother   ? Heart failure Mother   ? Skin cancer Father   ? Cancer Father   ?     skin cancer  ? Heart disease Maternal Grandmother   ? Heart disease Maternal Uncle   ? ? ?Social History  ? ?Socioeconomic History  ? Marital status: Married  ?  Spouse name: Mishaal Lansdale  ? Number of children: 0  ? Years of education: Not on file  ?  Highest education level: Not on file  ?Occupational History  ? 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 Smoking status: Never  ? Smokeless tobacco: Never  ? Tobacco comments:  ?  smoked for 1 yr socially in college  ?Vaping Use  ? Vaping Use: Never used  ?Substance and Sexual Activity  ? Alcohol use: Yes  ?  Comment: social-occ  ? Drug use: Never  ? Sexual activity: Yes  ?  Birth control/protection: Pill  ?Other Topics Concern  ? Not on file  ?Social History Narrative  ? 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 No dietary restrictions, walking  ? ?Social Determinants of Health  ? ?Financial Resource Strain: Not on file  ?Food Insecurity: Not on file  ?Transportation Needs: Not on file  ?Physical Activity: Not on file  ?Stress: Not on file  ?Social Connections: Not on file  ?Intimate Partner Violence: Not on file  ? ? ?Outpatient Medications Prior to Visit  ?Medication Sig Dispense Refill  ? aspirin-acetaminophen-caffeine (EXCEDRIN MIGRAINE) 250-250-65 MG tablet Take 1 tablet by mouth every 6 (six) hours as needed for headache.    ? calcium carbonate (TUMS - DOSED IN MG ELEMENTAL  CALCIUM) 500 MG chewable tablet Chew 1 tablet by mouth daily as needed for indigestion or heartburn.    ? Norgestimate-Ethinyl Estradiol Triphasic 0.18/0.215/0.25 MG-35 MCG tablet Take 1 tablet by mouth daily.    ? sertraline (ZOLOFT) 100 MG tablet TAKE 1/2 TABLET EVERY      MORNING AND 1 TABLET EVERY EVENING 135 tablet 1  ? albuterol (VENTOLIN HFA) 108 (90 Base) MCG/ACT inhaler Inhale 2 puffs into the lungs every 6 (six) hours as needed for wheezing or shortness of breath. 8 g 0  ? benzonatate (TESSALON) 100 MG capsule Take 1 capsule (100 mg total) by mouth 3 (three) times daily as needed. 20 capsule 0  ? ?No facility-administered medications prior to visit.  ? ? ?Allergies  ?Allergen Reactions  ? Ciprofloxacin Itching  ? Rocephin [Ceftriaxone]  Itching  ? Penicillins Itching and Rash  ?  Has patient had a PCN reaction causing immediate rash, facial/tongue/throat swelling, SOB or lightheadedness with hypotension: Unknown ?Has patient had a PCN reaction causing severe rash involving mucus membranes or skin necrosis: Unknown ?Has patient had a PCN reaction that required hospitalization Unknown ?Has patient had a PCN reaction occurring within the last 10 years: Yes ?If all of the above answers are "NO", then may proceed with Cephalosporin use. ?  ? ? ?Review of Systems  ?Constitutional:  Negative for chills, fever and malaise/fatigue.  ?HENT:  Negative for congestion and hearing loss.   ?Eyes:  Negative for discharge.  ?Respiratory:  Negative for cough, sputum production and shortness of breath.   ?Cardiovascular:  Negative for chest pain, palpitations and leg swelling.  ?Gastrointestinal:  Negative for abdominal pain, blood in stool, constipation, diarrhea, heartburn, nausea and vomiting.  ?Genitourinary:  Negative for dysuria, frequency, hematuria and urgency.  ?Musculoskeletal:  Negative for back pain, falls and myalgias.  ?Skin:  Negative for rash.  ?Neurological:  Negative for dizziness, sensory change, loss of consciousness, weakness and headaches.  ?Endo/Heme/Allergies:  Negative for environmental allergies. Does not bruise/bleed easily.  ?Psychiatric/Behavioral:  Negative for depression and suicidal ideas. The patient is not nervous/anxious and does not have insomnia.   ? ?   ?Objective:  ?  ?Physical Exam ?Constitutional:   ?   General: She is not in acute distress. ?   Appearance: She is well-developed.  ?HENT:  ?   Head: Normocephalic and atraumatic.  ?Eyes:  ?   Conjunctiva/sclera: Conjunctivae normal.  ?Neck:  ?   Thyroid: No thyromegaly.  ?Cardiovascular:  ?   Rate and Rhythm: Normal rate and regular rhythm.  ?   Heart sounds: Normal heart sounds. No murmur heard. ?Pulmonary:  ?   Effort: Pulmonary effort is normal. No respiratory distress.  ?    Breath sounds: Normal breath sounds.  ?Abdominal:  ?   General: Bowel sounds are normal. There is no distension.  ?   Palpations: Abdomen is soft. There is no mass.  ?   Tenderness: There is no abdominal tenderness.  ?Musculoskeletal:  ?   Cervical back: Neck supple.  ?Lymphadenopathy:  ?   Cervical: No cervical adenopathy.  ?Skin: ?   General: Skin is warm and dry.  ?Neurological:  ?   Mental Status: She is alert and oriented to person, place, and time.  ?Psychiatric:     ?   Behavior: Behavior normal.  ? ? ?BP 116/78 (BP Location: Left Arm, Patient Position: Sitting, Cuff Size: Normal)   Pulse 82   Resp 20   Ht 5\' 4"  (1.626 m)  Wt 277 lb (125.6 kg)   SpO2 98%   BMI 47.55 kg/m?  ?Wt Readings from Last 3 Encounters:  ?04/18/21 277 lb (125.6 kg)  ?03/07/21 274 lb (124.3 kg)  ?09/20/20 269 lb 12.8 oz (122.4 kg)  ? ? ?Diabetic Foot Exam - Simple   ?No data filed ?  ? ?Lab Results  ?Component Value Date  ? WBC 9.6 04/18/2021  ? HGB 13.6 04/18/2021  ? HCT 41.1 04/18/2021  ? PLT 353.0 04/18/2021  ? GLUCOSE 82 04/18/2021  ? CHOL 260 (H) 04/18/2021  ? TRIG 123.0 04/18/2021  ? HDL 82.90 04/18/2021  ? LDLCALC 152 (H) 04/18/2021  ? ALT 10 04/18/2021  ? AST 13 04/18/2021  ? NA 135 04/18/2021  ? K 4.6 04/18/2021  ? CL 99 04/18/2021  ? CREATININE 0.81 04/18/2021  ? BUN 16 04/18/2021  ? CO2 28 04/18/2021  ? TSH 2.54 04/18/2021  ? INR 0.9 05/12/2008  ? HGBA1C 5.5 04/18/2021  ? ? ?Lab Results  ?Component Value Date  ? TSH 2.54 04/18/2021  ? ?Lab Results  ?Component Value Date  ? WBC 9.6 04/18/2021  ? HGB 13.6 04/18/2021  ? HCT 41.1 04/18/2021  ? MCV 88.5 04/18/2021  ? PLT 353.0 04/18/2021  ? ?Lab Results  ?Component Value Date  ? NA 135 04/18/2021  ? K 4.6 04/18/2021  ? CO2 28 04/18/2021  ? GLUCOSE 82 04/18/2021  ? BUN 16 04/18/2021  ? CREATININE 0.81 04/18/2021  ? BILITOT 0.3 04/18/2021  ? ALKPHOS 106 04/18/2021  ? AST 13 04/18/2021  ? ALT 10 04/18/2021  ? PROT 7.5 04/18/2021  ? ALBUMIN 3.7 04/18/2021  ? CALCIUM 9.4  04/18/2021  ? ANIONGAP 10 04/06/2015  ? GFR 92.09 04/18/2021  ? ?Lab Results  ?Component Value Date  ? CHOL 260 (H) 04/18/2021  ? ?Lab Results  ?Component Value Date  ? HDL 82.90 04/18/2021  ? ?Lab Results  ?Component Va

## 2021-04-18 ENCOUNTER — Ambulatory Visit (INDEPENDENT_AMBULATORY_CARE_PROVIDER_SITE_OTHER): Payer: BC Managed Care – PPO | Admitting: Family Medicine

## 2021-04-18 ENCOUNTER — Encounter: Payer: Self-pay | Admitting: Family Medicine

## 2021-04-18 VITALS — BP 116/78 | HR 82 | Resp 20 | Ht 64.0 in | Wt 277.0 lb

## 2021-04-18 DIAGNOSIS — T7840XD Allergy, unspecified, subsequent encounter: Secondary | ICD-10-CM

## 2021-04-18 DIAGNOSIS — Z0000 Encounter for general adult medical examination without abnormal findings: Secondary | ICD-10-CM

## 2021-04-18 DIAGNOSIS — E559 Vitamin D deficiency, unspecified: Secondary | ICD-10-CM | POA: Diagnosis not present

## 2021-04-18 DIAGNOSIS — E785 Hyperlipidemia, unspecified: Secondary | ICD-10-CM | POA: Diagnosis not present

## 2021-04-18 MED ORDER — CETIRIZINE HCL 10 MG PO TABS: 10.0000 mg | ORAL_TABLET | Freq: Every day | ORAL | 11 refills | Status: AC

## 2021-04-18 NOTE — Patient Instructions (Signed)
Vitamin C 1000 mg, Multivitamin with minerals, mucinex twice 400-800 mg each dose, elderberry ? ?Preventive Care 27-39 Years Old, Female ?Preventive care refers to lifestyle choices and visits with your health care provider that can promote health and wellness. Preventive care visits are also called wellness exams. ?What can I expect for my preventive care visit? ?Counseling ?During your preventive care visit, your health care provider may ask about your: ?Medical history, including: ?Past medical problems. ?Family medical history. ?Pregnancy history. ?Current health, including: ?Menstrual cycle. ?Method of birth control. ?Emotional well-being. ?Home life and relationship well-being. ?Sexual activity and sexual health. ?Lifestyle, including: ?Alcohol, nicotine or tobacco, and drug use. ?Access to firearms. ?Diet, exercise, and sleep habits. ?Work and work Statistician. ?Sunscreen use. ?Safety issues such as seatbelt and bike helmet use. ?Physical exam ?Your health care provider may check your: ?Height and weight. These may be used to calculate your BMI (body mass index). BMI is a measurement that tells if you are at a healthy weight. ?Waist circumference. This measures the distance around your waistline. This measurement also tells if you are at a healthy weight and may help predict your risk of certain diseases, such as type 2 diabetes and high blood pressure. ?Heart rate and blood pressure. ?Body temperature. ?Skin for abnormal spots. ?What immunizations do I need? ?Vaccines are usually given at various ages, according to a schedule. Your health care provider will recommend vaccines for you based on your age, medical history, and lifestyle or other factors, such as travel or where you work. ?What tests do I need? ?Screening ?Your health care provider may recommend screening tests for certain conditions. This may include: ?Pelvic exam and Pap test. ?Lipid and cholesterol levels. ?Diabetes screening. This is done by  checking your blood sugar (glucose) after you have not eaten for a while (fasting). ?Hepatitis B test. ?Hepatitis C test. ?HIV (human immunodeficiency virus) test. ?STI (sexually transmitted infection) testing, if you are at risk. ?BRCA-related cancer screening. This may be done if you have a family history of breast, ovarian, tubal, or peritoneal cancers. ?Talk with your health care provider about your test results, treatment options, and if necessary, the need for more tests. ?Follow these instructions at home: ?Eating and drinking ? ?Eat a healthy diet that includes fresh fruits and vegetables, whole grains, lean protein, and low-fat dairy products. ?Take vitamin and mineral supplements as recommended by your health care provider. ?Do not drink alcohol if: ?Your health care provider tells you not to drink. ?You are pregnant, may be pregnant, or are planning to become pregnant. ?If you drink alcohol: ?Limit how much you have to 0-1 drink a day. ?Know how much alcohol is in your drink. In the [place] one drink equals one 12 oz bottle of beer (355 mL), one 5 oz glass of wine (148 mL), or one 1? oz glass of hard liquor (44 mL). ?Lifestyle ?Brush your teeth every morning and night with fluoride toothpaste. Floss one time each day. ?Exercise for at least 30 minutes 5 or more days each week. ?Do not use any products that contain nicotine or tobacco. These products include cigarettes, chewing tobacco, and vaping devices, such as e-cigarettes. If you need help quitting, ask your health care provider. ?Do not use drugs. ?If you are sexually active, practice safe sex. Use a condom or other form of protection to prevent STIs. ?If you do not wish to become pregnant, use a form of birth control. If you plan to become pregnant, see your health care  provider for a prepregnancy visit. ?Find healthy ways to manage stress, such as: ?Meditation, yoga, or listening to music. ?Journaling. ?Talking to a trusted person. ?Spending time  with friends and family. ?Minimize exposure to UV radiation to reduce your risk of skin cancer. ?Safety ?Always wear your seat belt while driving or riding in a vehicle. ?Do not drive: ?If you have been drinking alcohol. Do not ride with someone who has been drinking. ?If you have been using any mind-altering substances or drugs. ?While texting. ?When you are tired or distracted. ?Wear a helmet and other protective equipment during sports activities. ?If you have firearms in your house, make sure you follow all gun safety procedures. ?Seek help if you have been physically or sexually abused. ?What's next? ?Go to your health care provider once a year for an annual wellness visit. ?Ask your health care provider how often you should have your eyes and teeth checked. ?Stay up to date on all vaccines. ?This information is not intended to replace advice given to you by your health care provider. Make sure you discuss any questions you have with your health care provider. ?Document Revised: 07/06/2020 Document Reviewed: 07/06/2020 ?Elsevier Patient Education ? [place]. ? ?

## 2021-04-18 NOTE — Assessment & Plan Note (Signed)
Supplement and monitor 

## 2021-04-19 DIAGNOSIS — T7840XA Allergy, unspecified, initial encounter: Secondary | ICD-10-CM | POA: Insufficient documentation

## 2021-04-19 LAB — COMPREHENSIVE METABOLIC PANEL
ALT: 10 U/L (ref 0–35)
AST: 13 U/L (ref 0–37)
Albumin: 3.7 g/dL (ref 3.5–5.2)
Alkaline Phosphatase: 106 U/L (ref 39–117)
BUN: 16 mg/dL (ref 6–23)
CO2: 28 mEq/L (ref 19–32)
Calcium: 9.4 mg/dL (ref 8.4–10.5)
Chloride: 99 mEq/L (ref 96–112)
Creatinine, Ser: 0.81 mg/dL (ref 0.40–1.20)
GFR: 92.09 mL/min (ref >60.00)
Glucose, Bld: 82 mg/dL (ref 70–99)
Potassium: 4.6 mEq/L (ref 3.5–5.1)
Sodium: 135 mEq/L (ref 135–145)
Total Bilirubin: 0.3 mg/dL (ref 0.2–1.2)
Total Protein: 7.5 g/dL (ref 6.0–8.3)

## 2021-04-19 LAB — CBC WITH DIFFERENTIAL/PLATELET
Basophils Absolute: 0.1 10*3/uL (ref 0.0–0.1)
Basophils Relative: 1 % (ref 0.0–3.0)
Eosinophils Absolute: 0.1 10*3/uL (ref 0.0–0.7)
Eosinophils Relative: 0.8 % (ref 0.0–5.0)
HCT: 41.1 % (ref 36.0–46.0)
Hemoglobin: 13.6 g/dL (ref 12.0–15.0)
Lymphocytes Relative: 28 % (ref 12.0–46.0)
Lymphs Abs: 2.7 10*3/uL (ref 0.7–4.0)
MCHC: 33 g/dL (ref 30.0–36.0)
MCV: 88.5 fl (ref 78.0–100.0)
Monocytes Absolute: 0.6 10*3/uL (ref 0.1–1.0)
Monocytes Relative: 5.8 % (ref 3.0–12.0)
Neutro Abs: 6.2 10*3/uL (ref 1.4–7.7)
Neutrophils Relative %: 64.4 % (ref 43.0–77.0)
Platelets: 353 10*3/uL (ref 150.0–400.0)
RBC: 4.64 Mil/uL (ref 3.87–5.11)
RDW: 14.5 % (ref 11.5–15.5)
WBC: 9.6 10*3/uL (ref 4.0–10.5)

## 2021-04-19 LAB — LIPID PANEL
Cholesterol: 260 mg/dL — ABNORMAL HIGH (ref 0–200)
HDL: 82.9 mg/dL (ref >39.00)
LDL Cholesterol: 152 mg/dL — ABNORMAL HIGH (ref 0–99)
NonHDL: 176.9
Total CHOL/HDL Ratio: 3
Triglycerides: 123 mg/dL (ref 0.0–149.0)
VLDL: 24.6 mg/dL (ref 0.0–40.0)

## 2021-04-19 LAB — VITAMIN D 25 HYDROXY (VIT D DEFICIENCY, FRACTURES): VITD: 34.37 ng/mL (ref 30.00–100.00)

## 2021-04-19 LAB — HEMOGLOBIN A1C: Hgb A1c MFr Bld: 5.5 % (ref 4.6–6.5)

## 2021-04-19 LAB — TSH: TSH: 2.54 u[IU]/mL (ref 0.35–5.50)

## 2021-04-19 NOTE — Assessment & Plan Note (Signed)
Cetirizine prn °

## 2021-04-19 NOTE — Assessment & Plan Note (Signed)
Patient encouraged to maintain heart healthy diet, regular exercise, adequate sleep. Consider daily probiotics. Take medications as prescribed. Labs ordered and reviewed 

## 2021-04-19 NOTE — Assessment & Plan Note (Signed)
Encourage heart healthy diet such as MIND or DASH diet, increase exercise, avoid trans fats, simple carbohydrates and processed foods, consider a krill or fish or flaxseed oil cap daily.  °

## 2021-05-10 DIAGNOSIS — Z124 Encounter for screening for malignant neoplasm of cervix: Secondary | ICD-10-CM | POA: Diagnosis not present

## 2021-05-10 DIAGNOSIS — Z01419 Encounter for gynecological examination (general) (routine) without abnormal findings: Secondary | ICD-10-CM | POA: Diagnosis not present

## 2021-05-10 LAB — HM PAP SMEAR
HM Pap smear: NORMAL
HPV, high-risk: NEGATIVE

## 2021-05-18 LAB — HM PAP SMEAR: HM Pap smear: NEGATIVE

## 2021-05-18 LAB — RESULTS CONSOLE HPV: CHL HPV: NEGATIVE

## 2021-05-30 ENCOUNTER — Encounter: Payer: Self-pay | Admitting: Family Medicine

## 2021-06-02 ENCOUNTER — Ambulatory Visit: Payer: BC Managed Care – PPO | Admitting: Family

## 2021-06-02 VITALS — BP 117/75 | HR 86 | Temp 98.2°F | Resp 16 | Ht 64.0 in | Wt 279.0 lb

## 2021-06-02 DIAGNOSIS — R0789 Other chest pain: Secondary | ICD-10-CM

## 2021-06-02 MED ORDER — PANTOPRAZOLE SODIUM 40 MG PO TBEC: 40.0000 mg | DELAYED_RELEASE_TABLET | Freq: Every day | ORAL | 3 refills | Status: DC

## 2021-06-02 NOTE — Patient Instructions (Signed)
Please begin protonix once daily for reflux symptoms. ?You should be contacted about scheduling your abdominal ultrasound.  ?Go to the ER if you develop severe/worsening chest pain.  ?

## 2021-06-02 NOTE — Assessment & Plan Note (Addendum)
Suspect reflux is culprit. Will give trial of protonix. She did have one episode of RUQ pain so I will check an abdominal US to further evaluate GB.  EKG tracing is personally reviewed.  EKG notes NSR.  No acute changes. She understands that if she develops new/worsening symptoms she is to go to the ER.  ?

## 2021-06-02 NOTE — Progress Notes (Addendum)
? ?Subjective:  ? ?By signing my name below, I, Cassell Clement, attest that this documentation has been prepared under the direction and in the presence of Alma Downs' Su[place], NP 06/02/2021  ? ? Patient ID: Selena Flores, female    DOB: [birth year], 41 y.o.   MRN: [id number] ? ?Chief Complaint  ?Patient presents with  ? Chest Pain  ?  Complains of episodes of chest pain radiating to the back  ? Gastroesophageal Reflux  ?  Complains of gastric reflux  ? ? ?HPI ?Patient is in today for an office visit  ? ?Chest Pain - She complains of sharp chest pain that radiates to her back. She describes pain as a shooting pain. Pain would eventually subside over time. The first time it happened, she was bending down and felt as though she was going to die. Symptoms have occurred 4 or 5 times in the last two weeks. She reports that the last time the symptom occurred was on 05/30/2021. Occasionally symptoms appear after eating a meal. She confirms that she has a history of standard reflux years ago where she experienced heartburn after eating a meal. She states that she has been burping more frequently. She also states that she has history of pain under her ribs. Denies of SOB, calf pain or swelling, and reports no history of clots in the family. She does note that she is currently on birth control.  ? ? ? ?Health Maintenance Due  ?Topic Date Due  ? COVID-19 Vaccine (4 - Booster for Moderna series) 03/13/2020  ? ? ?Past Medical History:  ?Diagnosis Date  ? Allergy   ? Anxiety   ? Anxiety disorder 02/17/2015  ? Chronic kidney disease   ? kidney infection 2008 ish  ? Constipation   ? Depression   ? Esophageal reflux 02/17/2015  ? Food allergy   ? onions itching  ? GERD (gastroesophageal reflux disease)   ? Hyperlipidemia   ? Intermittent palpitations 02/25/2018  ? Melasma 02/17/2015  ? Migraines   ? Morbid obesity (HCC) 07/10/2017  ? Morbid obesity with BMI of 45.0-49.9, adult (HCC) 02/17/2015  ? Obsessive compulsive  personality disorder (HCC) 02/17/2015  ? 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 Plantar fasciitis 11/28/2017  ? Preventative health care 10/15/2017  ? GYN: Dr Tami Ribas [facility]  ? 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  ? Vitamin D deficiency 11/28/2017  ? ? ?Past Surgical History:  ?Procedure Laterality Date  ? CERVICAL CONE BIOPSY    ? TONSILLECTOMY    ? ? ?Family History  ?Problem Relation Age of Onset  ? Heart disease Mother   ?     chf  ? Obesity Mother   ? Anxiety disorder Mother   ? Heart failure Mother   ? Skin cancer Father   ? Cancer Father   ?     skin cancer  ? Heart disease Maternal Grandmother   ? Heart disease Maternal Uncle   ? ? ?Social History  ? ?Socioeconomic History  ? Marital status: Married  ?  Spouse name: Veyda Kaufman  ? Number of children: 0  ? Years of education: Not on file  ? Highest education level: Not on file  ?Occupational History  ? 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 Smoking status: Never  ? Smokeless tobacco: Never  ? Tobacco comments:  ?  smoked for 1 yr socially in college  ?Vaping Use  ? Vaping Use: Never  used  ?Substance and Sexual Activity  ? Alcohol use: Yes  ?  Comment: social-occ  ? Drug use: Never  ? Sexual activity: Yes  ?  Birth control/protection: Pill  ?Other Topics Concern  ? Not on file  ?Social History Narrative  ? 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 No dietary restrictions, walking  ? ?Social Determinants of Health  ? ?Financial Resource Strain: Not on file  ?Food Insecurity: Not on file  ?Transportation Needs: Not on file  ?Physical Activity: Not on file  ?Stress: Not on file  ?Social Connections: Not on file  ?Intimate Partner Violence: Not on file  ? ? ?Outpatient Medications Prior to Visit  ?Medication Sig Dispense Refill  ? aspirin-acetaminophen-caffeine (EXCEDRIN MIGRAINE) 250-250-65 MG tablet Take 1 tablet by mouth every 6 (six) hours  as needed for headache.    ? calcium carbonate (TUMS - DOSED IN MG ELEMENTAL CALCIUM) 500 MG chewable tablet Chew 1 tablet by mouth daily as needed for indigestion or heartburn.    ? cetirizine (ZYRTEC) 10 MG tablet Take 1 tablet (10 mg total) by mouth daily. 30 tablet 11  ? Norgestimate-Ethinyl Estradiol Triphasic 0.18/0.215/0.25 MG-35 MCG tablet Take 1 tablet by mouth daily.    ? sertraline (ZOLOFT) 100 MG tablet TAKE 1/2 TABLET EVERY      MORNING AND 1 TABLET EVERY EVENING 135 tablet 1  ? ?No facility-administered medications prior to visit.  ? ? ?Allergies  ?Allergen Reactions  ? Ciprofloxacin Itching  ? Rocephin [Ceftriaxone] Itching  ? Penicillins Itching and Rash  ?  Has patient had a PCN reaction causing immediate rash, facial/tongue/throat swelling, SOB or lightheadedness with hypotension: Unknown ?Has patient had a PCN reaction causing severe rash involving mucus membranes or skin necrosis: Unknown ?Has patient had a PCN reaction that required hospitalization Unknown ?Has patient had a PCN reaction occurring within the last 10 years: Yes ?If all of the above answers are "NO", then may proceed with Cephalosporin use. ?  ? ? ?Review of Systems  ?Respiratory:  Negative for shortness of breath.   ?Cardiovascular:  Positive for chest pain (Radiates to back). Negative for leg swelling.  ? ?   ?Objective:  ?  ?Physical Exam ?Constitutional:   ?   General: She is not in acute distress. ?   Appearance: Normal appearance. She is not ill-appearing.  ?HENT:  ?   Head: Normocephalic and atraumatic.  ?   Right Ear: External ear normal.  ?   Left Ear: External ear normal.  ?Eyes:  ?   Extraocular Movements: Extraocular movements intact.  ?   Pupils: Pupils are equal, round, and reactive to light.  ?Cardiovascular:  ?   Rate and Rhythm: Normal rate and regular rhythm.  ?   Heart sounds: Normal heart sounds. No murmur heard. ?  No gallop.  ?Pulmonary:  ?   Effort: Pulmonary effort is normal. No respiratory distress.  ?    Breath sounds: Normal breath sounds. No wheezing or rales.  ?Skin: ?   General: Skin is warm and dry.  ?Neurological:  ?   Mental Status: She is alert and oriented to person, place, and time.  ?Psychiatric:     ?   Mood and Affect: Mood normal.     ?   Behavior: Behavior normal.     ?   Judgment: Judgment normal.  ? ? ?BP 117/75 (BP Location: Right Arm, Patient Position: Sitting, Cuff Size: Large)  Pulse 86   Temp 98.2 ?F (36.8 ?C) (Oral)   Resp 16   Ht 5\' 4"  (1.626 m)   Wt 279 lb (126.6 kg)   SpO2 98%   BMI 47.89 kg/m?  ?Wt Readings from Last 3 Encounters:  ?06/02/21 279 lb (126.6 kg)  ?04/18/21 277 lb (125.6 kg)  ?03/07/21 274 lb (124.3 kg)  ? ? ?   ?Assessment & Plan:  ? ?Problem List Items Addressed This Visit   ? ?  ? Unprioritized  ? Atypical chest pain - Primary  ?  Suspect reflux is culprit. Will give trial of protonix. She did have one episode of RUQ pain so I will check an abdominal US to further evaluate GB.  EKG tracing is personally reviewed.  EKG notes NSR.  No acute changes. She understands that if she develops new/worsening symptoms she is to go to the ER.  ? ?  ?  ? Relevant Orders  ? EKG 12-Lead (Completed)  ? US Abdomen Complete  ? ? ? ?Meds ordered this encounter  ?Medications  ? pantoprazole (PROTONIX) 40 MG tablet  ?  Sig: Take 1 tablet (40 mg total) by mouth daily.  ?  Dispense:  30 tablet  ?  Refill:  3  ?  Order Specific Question:   Supervising Provider  ?  Answer:   Danise EdgeBLYTH, STACEY A [[id number]]  ? ? ?I, Lemont FillersMelissa S O'Sullivan, NP, personally preformed the services described in this documentation.  All medical record entries made by the scribe were at my direction and in my presence.  I have reviewed the chart and discharge instructions (if applicable) and agree that the record reflects my personal performance and is accurate and complete. 06/02/2021 ? ? ?I,Amber Collins,acting as a Neurosurgeonscribe for Merck & CoMelissa S O'Sullivan, NP.,have documented all relevant documentation on the behalf of Lemont FillersMelissa S  O'Sullivan, NP,as directed by  Lemont FillersMelissa S O'Sullivan, NP while in the presence of Lemont FillersMelissa S O'Sullivan, NP. ? ? ? ?Lemont FillersMelissa S O'Sullivan, NP ? ?

## 2021-06-05 ENCOUNTER — Ambulatory Visit (HOSPITAL_BASED_OUTPATIENT_CLINIC_OR_DEPARTMENT_OTHER)
Admission: RE | Admit: 2021-06-05 | Discharge: 2021-06-05 | Disposition: A | Discharge status: Home / Self Care | Payer: BC Managed Care – PPO | Source: Ambulatory Visit | Attending: Family | Admitting: Family

## 2021-06-05 DIAGNOSIS — R0789 Other chest pain: Secondary | ICD-10-CM | POA: Diagnosis not present

## 2021-06-05 DIAGNOSIS — K7689 Other specified diseases of liver: Secondary | ICD-10-CM | POA: Diagnosis not present

## 2021-06-07 ENCOUNTER — Telehealth: Payer: Self-pay

## 2021-06-07 ENCOUNTER — Telehealth: Payer: Self-pay | Admitting: Family

## 2021-06-07 DIAGNOSIS — R160 Hepatomegaly, not elsewhere classified: Secondary | ICD-10-CM | POA: Insufficient documentation

## 2021-06-07 NOTE — Telephone Encounter (Signed)
Please advise pt that her gallbladder looks good.  Radiologist did note a 3cm wide spot in her liver that we should further evaluate with MRI.  I have placed the MRI order.   ?

## 2021-06-07 NOTE — Telephone Encounter (Signed)
Patient advised of results and new findings. She was made aware will receive a call from radiology to set up MRI ?

## 2021-06-07 NOTE — Telephone Encounter (Signed)
Nurse Assessment ?Nurse: Yetta Barre, RN, Rene Kocher Date/Time (Eastern Time): 06/06/2021 8:12:24 PM ?Is there an on-call provider listed? ---Yes ?Please list name of person reporting value (Lab ?Employee) and a contact number. ---[facility] [phone] ?Please document the following items: Lab name Lab ?value (read back to lab to verify) Reference range ?for lab value Date and time blood was drawn Collect ?time of birth for bilirubin results ?---Ultrasound of Abdomen 1st Impression 3.3cm ?hypoechoic mass within the right lobe liver ?Recommendation is MRI with and without contrast No ?abnormalities to explain RUQ abdominal pain. ?Please collect the patient contact information from ?the lab. (name, phone number and address) ---Carmelia Roller [phone] ?Disp. Time (Eastern ?Time) Disposition Final User ?06/06/2021 8:24:45 PM Called On-Call Provider Yetta Barre, RN, [place] ?06/06/2021 8:25:18 PM Clinical Call Yes Yetta Barre, RN, Rene Kocher ?Paging ?DoctorName Phone DateTime Result/ ?Outcome Message Type Notes ?Leticia Penna [phone] ?06/06/2021 ?8:24:45 ?PM ?Called On ?Call Provider - ?Reached ?Doctor Paged ?PLEASE NOTE: All timestamps contained within this report are represented as [place] Standard Time. ?CONFIDENTIALTY NOTICE: This fax transmission is intended only for the addressee. It contains information that is legally privileged, confidential or ?otherwise protected from use or disclosure. If you are not the intended recipient, you are strictly prohibited from reviewing, disclosing, copying using ?or disseminating any of this information or taking any action in reliance on or regarding this information. If you have received this fax in error, please ?notify us immediately by telephone so that we can arrange for its return to [place]. Phone: [phone], Toll-Free: [phone], Fax: [phone] ?Page: 2 of 2 ?Call Id: [id number] ?Paging ?DoctorName Phone DateTime Result/ ?Outcome Message Type Notes ?Leticia Penna ?06/06/2021 ?8:25:06 ?PM ?Spoke with On ?Call - General Message Result Provided the [place] results to the on call provider ?

## 2021-06-08 ENCOUNTER — Telehealth: Payer: Self-pay | Admitting: Family Medicine

## 2021-06-08 ENCOUNTER — Other Ambulatory Visit: Payer: Self-pay | Admitting: Family Medicine

## 2021-06-08 DIAGNOSIS — R0789 Other chest pain: Secondary | ICD-10-CM

## 2021-06-08 DIAGNOSIS — E785 Hyperlipidemia, unspecified: Secondary | ICD-10-CM

## 2021-06-08 NOTE — Telephone Encounter (Signed)
Pt called stating that she would like a referral back to the [facility] she visited back a few years ago to address the chest pain symptoms that she is currently experiencing. Pt saw Melissa last appt for these symptoms.

## 2021-06-09 NOTE — Telephone Encounter (Signed)
Referral has been placed already

## 2021-06-13 ENCOUNTER — Encounter: Payer: Self-pay | Admitting: Family

## 2021-06-17 ENCOUNTER — Ambulatory Visit (HOSPITAL_BASED_OUTPATIENT_CLINIC_OR_DEPARTMENT_OTHER): Payer: BC Managed Care – PPO

## 2021-06-24 ENCOUNTER — Ambulatory Visit (HOSPITAL_BASED_OUTPATIENT_CLINIC_OR_DEPARTMENT_OTHER)
Admission: RE | Admit: 2021-06-24 | Discharge: 2021-06-24 | Disposition: A | Discharge status: Home / Self Care | Payer: BC Managed Care – PPO | Source: Ambulatory Visit | Attending: Family | Admitting: Family

## 2021-06-24 DIAGNOSIS — K7689 Other specified diseases of liver: Secondary | ICD-10-CM | POA: Diagnosis not present

## 2021-06-24 DIAGNOSIS — R160 Hepatomegaly, not elsewhere classified: Secondary | ICD-10-CM | POA: Insufficient documentation

## 2021-06-24 MED ORDER — GADOBUTROL 1 MMOL/ML IV SOLN
10.0000 mL | Freq: Once | INTRAVENOUS | Status: AC | PRN
  Administered 2021-06-24: 10 mL via INTRAVENOUS

## 2021-06-27 ENCOUNTER — Telehealth: Payer: Self-pay | Admitting: Family

## 2021-06-27 DIAGNOSIS — R160 Hepatomegaly, not elsewhere classified: Secondary | ICD-10-CM

## 2021-06-27 NOTE — Assessment & Plan Note (Signed)
Appeared benign on MRI 6/23- should repeat in 3 months.

## 2021-06-27 NOTE — Telephone Encounter (Signed)
Spot in her liver appears non-cancerous. The radiologist would like for her to repeat MRI in 3 months though.

## 2021-06-28 NOTE — Telephone Encounter (Signed)
Called but no answer, lvm for patient to call back for results and provider's comments

## 2021-06-28 NOTE — Telephone Encounter (Signed)
Results given to patient, she will follow up on Friday as scheduled.

## 2021-06-30 ENCOUNTER — Ambulatory Visit: Payer: BC Managed Care – PPO | Admitting: Family

## 2021-06-30 DIAGNOSIS — K219 Gastro-esophageal reflux disease without esophagitis: Secondary | ICD-10-CM | POA: Diagnosis not present

## 2021-06-30 DIAGNOSIS — R160 Hepatomegaly, not elsewhere classified: Secondary | ICD-10-CM

## 2021-06-30 DIAGNOSIS — R0789 Other chest pain: Secondary | ICD-10-CM | POA: Diagnosis not present

## 2021-06-30 NOTE — Progress Notes (Signed)
Subjective:   By signing my name below, I, Selena Flores, attest that this documentation has been prepared under the direction and in the presence of Sandford Craze NP, 06/30/2021   Patient ID: Selena Flores, female    DOB: 19-Apr-1980, 41 y.o.   MRN: [id number]  No chief complaint on file.   HPI Patient is in today for an office visit.  Chest Pain/Reflux - She is taking 40 Mg of Protonix. She states that her reflux symptoms have improved. She also reports nochest pain. She is not sure whether the medication improved her chest pain since the symptoms did not appear prior to the last visit. She is scheduled to see a cardiologist on 07/04/2021. MRI Results - She is inquiring about her MRI results of her liver. She did research on the Internet about benign hepatic adenoma and states that symptoms could appear from hormonal birth control. She reports that she has been on hormonal birth control for years. She goes to [facility] for Ob[facility]GYN. She is considering discontinuing her birth control medications. She has concerns about the possibility of blood clots while taking the medication.   Left Leg Pain - She states that she occasionally gets leg pain in her left thigh. She states that pain feels like a cramp. She also states that she has varicose veins in her lower legs. She is going to follow up with her cardiologist.    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   Patellofemoral syndrome 02/17/2015   Plantar fasciitis 11/28/2017   Preventative health care 10/15/2017   GYN: Dr [facility]   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   Vitamin D deficiency 11/28/2017    Past Surgical History:  Procedure Laterality Date   CERVICAL CONE BIOPSY     TONSILLECTOMY      Family History  Problem Relation Age of Onset   Heart disease Mother        chf   Obesity Mother    Anxiety disorder Mother    Heart failure Mother    Skin cancer Father    Cancer Father        skin cancer   Heart disease Maternal Grandmother    Heart disease Maternal Uncle     Social History   Socioeconomic History   Marital status: Married    Spouse name: Mailyn Steichen   Number of children: 0   Years of education: Not on file   Highest education level: Not on file  Occupational History   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   Tobacco Use   Smoking status: Never   Smokeless tobacco: Never   Tobacco comments:    smoked for 1 yr socially in college  Vaping Use   Vaping Use: Never used  Substance  and Sexual Activity   Alcohol use: Yes    Comment: social-occ   Drug use: Never   Sexual activity: Yes    Birth control/protection: Pill  Other Topics Concern   Not on file  Social History Narrative   Lives with boyfriend, has 3 dogs, works in Clinical biochemist with [employer]   No dietary restrictions, walking   Social Determinants of Corporate investment banker Strain: Not on file  Food Insecurity: Not on file  Transportation Needs: Not on file  Physical Activity: Not on file  Stress: Not on file  Social Connections: Not on file  Intimate Partner Violence: Not on file    Outpatient Medications Prior to Visit  Medication Sig Dispense Refill   aspirin-acetaminophen-caffeine (EXCEDRIN MIGRAINE) 250-250-65 MG  tablet Take 1 tablet by mouth every 6 (six) hours as needed for headache.     calcium carbonate (TUMS - DOSED IN MG ELEMENTAL CALCIUM) 500 MG chewable tablet Chew 1 tablet by mouth daily as needed for indigestion or heartburn.     cetirizine (ZYRTEC) 10 MG tablet Take 1 tablet (10 mg total) by mouth daily. 30 tablet 11   Norgestimate-Ethinyl Estradiol Triphasic 0.18/0.215/0.25 MG-35 MCG tablet Take 1 tablet by mouth daily.     pantoprazole (PROTONIX) 40 MG tablet Take 1 tablet (40 mg total) by mouth daily. 30 tablet 3   sertraline (ZOLOFT) 100 MG tablet TAKE 1/2 TABLET EVERY      MORNING AND 1 TABLET EVERY EVENING 135 tablet 1   No facility-administered medications prior to visit.    Allergies  Allergen Reactions   Ciprofloxacin Itching   Rocephin [Ceftriaxone] Itching   Penicillins Itching and Rash    Has patient had a PCN reaction causing immediate rash, facial/tongue/throat swelling, SOB or lightheadedness with hypotension: Unknown Has patient had a PCN reaction causing severe rash involving mucus membranes or skin necrosis: Unknown Has patient had a PCN reaction that required hospitalization Unknown Has patient had a PCN reaction occurring within the last 10 years: Yes If all of the above answers are "NO", then may proceed with Cephalosporin use.     Review of Systems  Musculoskeletal:  Positive for myalgias (Left Thigh).       Objective:    Physical Exam Constitutional:      General: She is not in acute distress.    Appearance: Normal appearance. She is not ill-appearing.  HENT:     Head: Normocephalic and atraumatic.     Right Ear: External ear normal.     Left Ear: External ear normal.  Eyes:     Extraocular Movements: Extraocular movements intact.     Pupils: Pupils are equal, round, and reactive to light.  Cardiovascular:     Rate and Rhythm: Normal rate and regular rhythm.     Heart sounds: Normal heart sounds. No murmur heard.    No gallop.  Pulmonary:      Effort: Pulmonary effort is normal. No respiratory distress.     Breath sounds: Normal breath sounds. No wheezing or rales.  Skin:    General: Skin is warm and dry.  Neurological:     Mental Status: She is alert and oriented to person, place, and time.  Psychiatric:        Mood and Affect: Mood normal.        Behavior: Behavior normal.        Judgment: Judgment normal.     There were no vitals taken for this visit. Wt Readings  from Last 3 Encounters:  06/02/21 279 lb (126.6 kg)  04/18/21 277 lb (125.6 kg)  03/07/21 274 lb (124.3 kg)       Assessment & Plan:   Problem List Items Addressed This Visit   None   No orders of the defined types were placed in this encounter.   I, Selena ClementAmber Collins, personally preformed the services described in this documentation.  All medical record entries made by the scribe were at my direction and in my presence.  I have reviewed the chart and discharge instructions (if applicable) and agree that the record reflects my personal performance and is accurate and complete. 06/30/2021   I,Amber Collins,acting as a scribe for Lemont FillersMelissa S O'Sullivan, NP.,have documented all relevant documentation on the behalf of Lemont FillersMelissa S O'Sullivan, NP,as directed by  Lemont FillersMelissa S O'Sullivan, NP while in the presence of Lemont FillersMelissa S O'Sullivan, NP.   U.S. Bancorpmber Collins

## 2021-07-03 NOTE — Assessment & Plan Note (Signed)
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 She has been on oral birth control for 20 years and is concerned that this is a concern. I advised her to further discuss with GYN.  Will plan to repeat MRI in 3 months per radiology recommendations.

## 2021-07-03 NOTE — Assessment & Plan Note (Signed)
Improved. She is scheduled to see cardiology for further evaluation.

## 2021-07-03 NOTE — Assessment & Plan Note (Signed)
Improved on protonix. Continue same.

## 2021-07-04 ENCOUNTER — Encounter: Payer: Self-pay | Admitting: Cardiology

## 2021-07-04 ENCOUNTER — Ambulatory Visit (INDEPENDENT_AMBULATORY_CARE_PROVIDER_SITE_OTHER): Payer: BC Managed Care – PPO | Admitting: Cardiology

## 2021-07-04 VITALS — BP 117/85 | HR 90 | Ht 64.0 in | Wt 279.4 lb

## 2021-07-04 DIAGNOSIS — R079 Chest pain, unspecified: Secondary | ICD-10-CM | POA: Diagnosis not present

## 2021-07-04 DIAGNOSIS — Z79899 Other long term (current) drug therapy: Secondary | ICD-10-CM

## 2021-07-04 DIAGNOSIS — E785 Hyperlipidemia, unspecified: Secondary | ICD-10-CM | POA: Diagnosis not present

## 2021-07-04 MED ORDER — METOPROLOL TARTRATE 100 MG PO TABS: ORAL_TABLET | ORAL | 0 refills | Status: DC

## 2021-07-04 MED ORDER — ROSUVASTATIN CALCIUM 5 MG PO TABS: 5.0000 mg | ORAL_TABLET | Freq: Every evening | ORAL | 3 refills | Status: DC

## 2021-07-04 NOTE — Progress Notes (Signed)
Cardiology Office Note:    Date:  07/04/2021   ID:  Selena Flores, DOB January 14, 1981, MRN [id number]  PCP:  Mosie Lukes, MD  Cardiologist:  Berniece Salines, DO  Electrophysiologist:  None   Referring MD: Mosie Lukes, MD   " I have had symptoms with chest pain"  History of Present Illness:    Selena Flores is a 41 y.o. female with a hx of chest pain 1-2 months prior when she was reaching for her dog's bowl. This has not occurred since but has occurred years prior.  She was supposed to have an echo but decided against it due to financial reasons. She states at the time it was a sharp centralized pain that radiated to her back and lasted no more than 2 minutes. She denies associated vision changes, dizziness, chest pain, shortness of breath, numbness and tingling.  Since then she has been treated for acid reflux by her PCP.  She continues to be worried about her family history.  Her mother passed away due to a heart condition in her 107s.  She states at the time her mother told her that she felt off called EMS and by the time she was in EMS she was unresponsive and her heart had stopped.  There is also question of a heart condition with her grandmother which she is unsure about.    She is aware of her elevated cholesterol and states that since she can remember it has always been over 200. Her primary care doctor has mentioned fish oil and statin prior to.  She has not started a statin due to fear of dangerous side effects.  She is amenable to trying a statin at this time.  She is also concerned that her birth control could cause blood clots.  She denies any personal and family history of VTE. She does not have children and does not desire children.   Of note she denies daytime sleepiness.  But endorses she may snore a little.  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   calcium carbonate (TUMS - DOSED IN MG ELEMENTAL CALCIUM) 500 MG chewable tablet Chew 1 tablet by mouth daily as needed for indigestion or heartburn.   cetirizine (ZYRTEC) 10 MG tablet Take 1 tablet (10 mg total) by mouth daily.   metoprolol tartrate (LOPRESSOR) 100 MG tablet Take 2 hours prior to CT   Norgestimate-Ethinyl Estradiol  Triphasic 0.18/0.215/0.25 MG-35 MCG tablet Take 1 tablet by mouth daily.   pantoprazole (PROTONIX) 40 MG tablet Take 1 tablet (40 mg total) by mouth daily.   rosuvastatin (CRESTOR) 5 MG tablet Take 1 tablet (5 mg total) by mouth at bedtime.   sertraline (ZOLOFT) 100 MG tablet TAKE 1/2 TABLET EVERY      MORNING AND 1 TABLET EVERY EVENING     Allergies:   Ciprofloxacin, Rocephin [ceftriaxone], and Penicillins   Social History   Socioeconomic History   Marital status: Married    Spouse name: Ayza Delich   Number of children: 0   Years of education: Not on file   Highest education level: Not on file  Occupational History   Occupation: Customer  service sr specialist    Comment: Customer Service   Tobacco Use   Smoking status: Never   Smokeless tobacco: Never   Tobacco comments:    smoked for 1 yr socially in college  Vaping Use   Vaping Use: Never used  Substance and Sexual Activity   Alcohol use: Yes    Comment: social-occ   Drug use: Never   Sexual activity: Yes    Birth control/protection: Pill  Other Topics Concern   Not on file  Social History Narrative   Lives with boyfriend, has 3 dogs, works in Clinical biochemist with [employer]   No dietary restrictions, walking   Social Determinants of Corporate investment banker Strain: Not on file  Food Insecurity: Not on file  Transportation Needs: Not on file  Physical Activity: Not on file  Stress: Not on file  Social Connections: Not on file     Family History: The patient's family history includes Anxiety disorder in her mother; Cancer in her father; Heart disease in her maternal grandmother, maternal uncle, and mother; Heart failure in her mother; Obesity in her mother; Skin cancer in her father.  ROS:   Review of Systems  Review of Systems  Constitutional:  Negative for fever and weight loss.  Eyes:  Negative for blurred vision.  Respiratory:  Negative for cough, shortness of breath and stridor.   Cardiovascular:  Negative for chest pain, palpitations, orthopnea, claudication and leg swelling.  Gastrointestinal:  Negative for abdominal pain, heartburn, nausea and vomiting.  Musculoskeletal:  Negative for joint pain.  Skin:  Negative for rash.  Neurological:  Negative for dizziness and headaches.  Endo/Heme/Allergies:  Does not bruise/bleed easily.  Psychiatric/Behavioral:  The patient is not nervous/anxious and does not have insomnia.     EKGs/Labs/Other Studies Reviewed:    The following studies were reviewed today:   EKG:  The ekg 06/02/2021 NSR.  Recent Labs: 04/18/2021: ALT 10; BUN 16; Creatinine, Ser 0.81; Hemoglobin 13.6; Platelets 353.0;  Potassium 4.6; Sodium 135; TSH 2.54  Recent Lipid Panel    Component Value Date/Time   CHOL 260 (H) 04/18/2021 1419   CHOL 229 (H) 09/11/2018 1145   TRIG 123.0 04/18/2021 1419   HDL 82.90 04/18/2021 1419   HDL 85 09/11/2018 1145   CHOLHDL 3 04/18/2021 1419   VLDL 24.6 04/18/2021 1419   LDLCALC 152 (H) 04/18/2021 1419   LDLCALC 125 (H) 09/11/2018 1145    Physical Exam:    VS:  BP 117/85 (BP Location: Left Arm, Patient Position: Sitting, Cuff Size: Large)   Pulse 90   Ht 5\' 4"  (1.626 m)   Wt 279 lb 6.4 oz (126.7 kg)   LMP 06/26/2021   SpO2 96%   BMI 47.96 kg/m  Wt Readings from Last 3 Encounters:  07/04/21 279 lb 6.4 oz (126.7 kg)  06/30/21 279 lb (126.6 kg)  06/02/21 279 lb (126.6 kg)     GEN: Well nourished, well developed in no acute distress HEENT: Normal NECK: No JVD CARDIAC: S1S2 noted,RRR, no murmurs, rubs, gallops RESPIRATORY:  Clear to auscultation without rales, wheezing or rhonchi  EXTREMITIES: No edema, No cyanosis, no clubbing MUSCULOSKELETAL:  No deformity  SKIN: Warm and dry NEUROLOGIC:  Alert and oriented x 3, non-focal PSYCHIATRIC:  Normal affect, good insight  ASSESSMENT:    1. Medication management   2. Chest pain of uncertain etiology   3. Hyperlipidemia, unspecified hyperlipidemia type   4. Chest pain, unspecified type    PLAN:     1. Medication management Suspect familial HLD. Will start statin today. Follow up in 12 weeks for repeat lipid panel. Continue Protonix.  - Lipoprotein A (LPA) - Basic Metabolic Panel (BMET) - Magnesium  2. Chest pain of uncertain etiology Hx of non-specified intermittent chest pain. Strong family hx of heart disease. Previously treated for GERD. Obtain CT below for appropriate risk stratification. Will hold off on echo given lack of active symptoms.  - Lipoprotein A (LPA) - Basic Metabolic Panel (BMET) - Magnesium - CT CORONARY MORPH W/CTA COR W/SCORE W/CA W/CM &/OR WO/CM; Future  3. Hyperlipidemia,  unspecified hyperlipidemia type LPA as above. Follow up in 12 weeks for repeat panel.  - Lipid panel  Patient seen and examined, note reviewed with the signed Resident. I personally reviewed laboratory data, imaging studies and relevant notes. I independently examined the patient and formulated the important aspects of the plan. I have personally discussed the plan with the patient and/or family. Comments or changes to the note/plan are indicated below.  Her chest pain is atypical but given her family history and risk factors we will pursue a coronary CT scan in this patient. Hyperlipidemia-start low-dose statin.  Repeat blood work in 12 weeks. LP(a) will be done as well. Concern for sleep apnea- Fatigue/with snoring will benefit from home sleep study.    Berniece Salines DO, MS [facility] Attending Cardiologist [place]  [address] [place],  [address] [phone] Website: [email or web address]   The patient is in agreement with the above plan. The patient left the office in stable condition.  The patient will follow up in 6 months.    Medication Adjustments/Labs and Tests Ordered: Current medicines are reviewed at length with the patient today.  Concerns regarding medicines are outlined above.  Orders Placed This Encounter  Procedures   CT CORONARY MORPH W/CTA COR W/SCORE W/CA W/CM &/OR WO/CM   Lipoprotein A (LPA)   Basic Metabolic Panel (BMET)   Magnesium   Lipid panel   Meds ordered this encounter  Medications   rosuvastatin (CRESTOR) 5 MG tablet    Sig: Take 1 tablet (5 mg total) by mouth at bedtime.    Dispense:  90 tablet    Refill:  3   metoprolol tartrate (LOPRESSOR) 100 MG tablet    Sig: Take 2 hours prior to CT    Dispense:  1 tablet    Refill:  0    Patient Instructions  Medication Instructions:  Your physician has recommended you make the following change in your medication:  START: Crestor 5 mg nightly *If you need a  refill on your cardiac medications before your next appointment, please call your pharmacy*   Lab Work: Your physician recommends that you return  for lab work in:  TODAY: Lp(a), BMET, [place] In 12 weeks: Lipids If you have labs (blood work) drawn today and your tests are completely normal, you will receive your results only by: [place] (if you have MyChart) OR A paper copy in the mail If you have any lab test that is abnormal or we need to change your treatment, we will call you to review the results.   Testing/Procedures:   Your cardiac CT will be scheduled at one of the below locations:   [facility] [address] [place], [place] [address] [phone]    If scheduled at [facility], please arrive at the [facility] and Children's Entrance (Entrance C2) of [facility] 30 minutes prior to test start time. You can use the FREE valet parking offered at entrance C (encouraged to control the heart rate for the test)  Proceed to the [facility] Radiology Department (first floor) to check-in and test prep.  All radiology patients and guests should use entrance C2 at [facility], accessed from [facility], even though the hospital's physical address listed is [address]     Please follow these instructions carefully (unless otherwise directed):   On the Night Before the Test: Be sure to Drink plenty of water. Do not consume any caffeinated/decaffeinated beverages or chocolate 12 hours prior to your test. Do not take any antihistamines 12 hours prior to your test.  On the Day of the Test: Drink plenty of water until 1 hour prior to the test. Do not eat any food 4 hours prior to the test. You may take your regular medications prior to the test.  Take metoprolol (Lopressor) two hours prior to test. FEMALES- please wear underwire-free bra if available, avoid dresses & tight clothing  After the Test: Drink plenty  of water. After receiving IV contrast, you may experience a mild flushed feeling. This is normal. On occasion, you may experience a mild rash up to 24 hours after the test. This is not dangerous. If this occurs, you can take Benadryl 25 mg and increase your fluid intake. If you experience trouble breathing, this can be serious. If it is severe call 911 IMMEDIATELY. If it is mild, please call our office. If you take any of these medications: Glipizide/Metformin, Avandament, Glucavance, please do not take 48 hours after completing test unless otherwise instructed.  We will call to schedule your test 2-4 weeks out understanding that some insurance companies will need an authorization prior to the service being performed.   For non-scheduling related questions, please contact the cardiac imaging nurse navigator should you have any questions/concerns: Marchia Bond, Cardiac Imaging Nurse Navigator Gordy Clement, Cardiac Imaging Nurse Navigator [facility] Direct Office Dial: [phone]   For scheduling needs, including cancellations and rescheduling, please call [place], [phone].    Follow-Up: At [facility], you and your health needs are our priority.  As part of our continuing mission to provide you with exceptional heart care, we have created designated Provider Care Teams.  These Care Teams include your primary Cardiologist (physician) and Advanced Practice Providers (APPs -  Physician Assistants and Nurse Practitioners) who all work together to provide you with the care you need, when you need it.  We recommend signing up for the patient portal called "MyChart".  Sign up information is provided on this After Visit Summary.  MyChart is used to connect with patients for Virtual Visits (Telemedicine).  Patients  are able to view lab/test results, encounter notes, upcoming appointments, etc.  Non-urgent messages can be sent to your provider as well.   To learn  more about what you can do with MyChart, go to [email or web address].    Your next appointment:   After CT scan  The format for your next appointment:   In Person  Provider:   Berniece Salines, DO    Other Instructions   Important Information About Sugar         Adopting a Healthy Lifestyle.  Know what a healthy weight is for you (roughly BMI <25) and aim to maintain this   Aim for 7+ servings of fruits and vegetables daily   65-80+ fluid ounces of water or unsweet tea for healthy kidneys   Limit to max 1 drink of alcohol per day; avoid smoking/tobacco   Limit animal fats in diet for cholesterol and heart health - choose grass fed whenever available   Avoid highly processed foods, and foods high in saturated/trans fats   Aim for low stress - take time to unwind and care for your mental health   Aim for 150 min of moderate intensity exercise weekly for heart health, and weights twice weekly for bone health   Aim for 7-9 hours of sleep daily   When it comes to diets, agreement about the perfect plan isnt easy to find, even among the experts. Experts at the [employer] developed an idea known as the Healthy Eating Plate. Just imagine a plate divided into logical, healthy portions.   The emphasis is on diet quality:   Load up on vegetables and fruits - one-half of your plate: Aim for color and variety, and remember that potatoes dont count.   Go for whole grains - one-quarter of your plate: Whole wheat, barley, wheat berries, quinoa, oats, brown rice, and foods made with them. If you want pasta, go with whole wheat pasta.   Protein power - one-quarter of your plate: Fish, chicken, beans, and nuts are all healthy, versatile protein sources. Limit red meat.   The diet, however, does go beyond the plate, offering a few other suggestions.   Use healthy plant oils, such as olive, canola, soy, corn, sunflower and peanut. Check the labels, and avoid  partially hydrogenated oil, which have unhealthy trans fats.   If youre thirsty, drink water. Coffee and tea are good in moderation, but skip sugary drinks and limit milk and dairy products to one or two daily servings.   The type of carbohydrate in the diet is more important than the amount. Some sources of carbohydrates, such as vegetables, fruits, whole grains, and beans-are healthier than others.   Finally, stay active  Signed, Berniece Salines, DO  07/04/2021 7:18 PM    [facility]

## 2021-07-04 NOTE — Patient Instructions (Addendum)
Medication Instructions:  Your physician has recommended you make the following change in your medication:  START: Crestor 5 mg nightly *If you need a refill on your cardiac medications before your next appointment, please call your pharmacy*   Lab Work: Your physician recommends that you return for lab work in:  TODAY: Lp(a), BMET, [place] In 12 weeks: Lipids If you have labs (blood work) drawn today and your tests are completely normal, you will receive your results only by: [place] (if you have MyChart) OR A paper copy in the mail If you have any lab test that is abnormal or we need to change your treatment, we will call you to review the results.   Testing/Procedures:   Your cardiac CT will be scheduled at one of the below locations:   [facility] [address] [place], [place] [address] [phone]    If scheduled at [facility], please arrive at the [facility] and Children's Entrance (Entrance C2) of [facility] 30 minutes prior to test start time. You can use the FREE valet parking offered at entrance C (encouraged to control the heart rate for the test)  Proceed to the [facility] Radiology Department (first floor) to check-in and test prep.  All radiology patients and guests should use entrance C2 at [facility], accessed from [facility], even though the hospital's physical address listed is [address].     Please follow these instructions carefully (unless otherwise directed):   On the Night Before the Test: Be sure to Drink plenty of water. Do not consume any caffeinated/decaffeinated beverages or chocolate 12 hours prior to your test. Do not take any antihistamines 12 hours prior to your test.  On the Day of the Test: Drink plenty of water until 1 hour prior to the test. Do not eat any food 4 hours prior to the test. You may take your regular medications prior to the test.  Take metoprolol  (Lopressor) two hours prior to test. FEMALES- please wear underwire-free bra if available, avoid dresses & tight clothing  After the Test: Drink plenty of water. After receiving IV contrast, you may experience a mild flushed feeling. This is normal. On occasion, you may experience a mild rash up to 24 hours after the test. This is not dangerous. If this occurs, you can take Benadryl 25 mg and increase your fluid intake. If you experience trouble breathing, this can be serious. If it is severe call 911 IMMEDIATELY. If it is mild, please call our office. If you take any of these medications: Glipizide/Metformin, Avandament, Glucavance, please do not take 48 hours after completing test unless otherwise instructed.  We will call to schedule your test 2-4 weeks out understanding that some insurance companies will need an authorization prior to the service being performed.   For non-scheduling related questions, please contact the cardiac imaging nurse navigator should you have any questions/concerns: Marchia Bond, Cardiac Imaging Nurse Navigator Gordy Clement, Cardiac Imaging Nurse Navigator [place] [facility] Direct Office Dial: [phone]   For scheduling needs, including cancellations and rescheduling, please call [place], [phone].    Follow-Up: At [facility], you and your health needs are our priority.  As part of our continuing mission to provide you with exceptional heart care, we have created designated Provider Care Teams.  These Care Teams include your primary Cardiologist (physician) and Advanced Practice Providers (APPs -  Physician Assistants and Nurse Practitioners) who all work together  to provide you with the care you need, when you need it.  We recommend signing up for the patient portal called "MyChart".  Sign up information is provided on this After Visit Summary.  MyChart is used to connect with patients for Virtual Visits (Telemedicine).   Patients are able to view lab/test results, encounter notes, upcoming appointments, etc.  Non-urgent messages can be sent to your provider as well.   To learn more about what you can do with MyChart, go to [email or web address].    Your next appointment:   After CT scan  The format for your next appointment:   In Person  Provider:   Berniece Salines, DO    Other Instructions   Important Information About Sugar

## 2021-07-05 LAB — BASIC METABOLIC PANEL
BUN/Creatinine Ratio: 24 — ABNORMAL HIGH (ref 9–23)
BUN: 19 mg/dL (ref 6–20)
CO2: 26 mmol/L (ref 20–29)
Calcium: 9.5 mg/dL (ref 8.7–10.2)
Chloride: 100 mmol/L (ref 96–106)
Creatinine, Ser: 0.8 mg/dL (ref 0.57–1.00)
Glucose: 80 mg/dL (ref 70–99)
Potassium: 4.9 mmol/L (ref 3.5–5.2)
Sodium: 138 mmol/L (ref 134–144)
eGFR: 97 mL/min/{1.73_m2} (ref >59)

## 2021-07-05 LAB — MAGNESIUM: Magnesium: 2.1 mg/dL (ref 1.6–2.3)

## 2021-07-05 LAB — LIPOPROTEIN A (LPA): Lipoprotein (a): 447.3 nmol/L — ABNORMAL HIGH (ref <75.0)

## 2021-07-07 ENCOUNTER — Encounter: Payer: Self-pay | Admitting: Cardiology

## 2021-07-21 ENCOUNTER — Ambulatory Visit: Payer: BC Managed Care – PPO | Admitting: Family

## 2021-07-21 VITALS — BP 115/74 | HR 74 | Temp 98.0°F | Resp 16 | Ht 64.0 in | Wt 280.0 lb

## 2021-07-21 DIAGNOSIS — L03114 Cellulitis of left upper limb: Secondary | ICD-10-CM | POA: Diagnosis not present

## 2021-07-21 DIAGNOSIS — Z23 Encounter for immunization: Secondary | ICD-10-CM

## 2021-07-21 MED ORDER — DOXYCYCLINE HYCLATE 100 MG PO TABS: 100.0000 mg | ORAL_TABLET | Freq: Two times a day (BID) | ORAL | 0 refills | Status: DC

## 2021-07-21 NOTE — Progress Notes (Signed)
Selena Flores is a 41 y.o. 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     calcium carbonate (TUMS - DOSED IN MG ELEMENTAL CALCIUM) 500 MG chewable tablet Chew 1 tablet by mouth daily as needed for indigestion or heartburn.     cetirizine (ZYRTEC) 10 MG tablet Take 1 tablet (10 mg total) by mouth daily. 30 tablet 11   doxycycline (VIBRA-TABS) 100 MG tablet Take 1 tablet (100 mg total) by mouth 2 (two) times daily. 14 tablet 0   metoprolol tartrate (LOPRESSOR) 100 MG tablet Take 2 hours prior to CT 1 tablet 0   Norgestimate-Ethinyl Estradiol Triphasic 0.18/0.215/0.25 MG-35 MCG tablet Take 1 tablet by mouth daily.     pantoprazole (PROTONIX) 40 MG tablet Take 1 tablet (40 mg total) by mouth daily. 30 tablet 3   rosuvastatin (CRESTOR) 5 MG tablet Take 1 tablet (5 mg total) by mouth at bedtime. 90 tablet 3   sertraline (ZOLOFT) 100 MG tablet TAKE 1/2 TABLET EVERY      MORNING AND 1 TABLET EVERY EVENING 135 tablet 1   No current facility-administered medications for this visit.     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   Patellofemoral syndrome 02/17/2015   Plantar fasciitis 11/28/2017   Preventative health care 10/15/2017   GYN: Dr Tami Ribas [facility]   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   Vitamin D deficiency 11/28/2017    Past Surgical History:  Procedure Laterality Date   CERVICAL CONE BIOPSY     TONSILLECTOMY      Family History  Problem Relation Age of Onset   Heart disease Mother        chf   Obesity Mother    Anxiety disorder Mother    Heart failure Mother    Skin cancer Father    Cancer Father        skin cancer   Heart disease Maternal Grandmother    Heart disease Maternal Uncle     Social History   Tobacco Use   Smoking status: Never  Smokeless tobacco: Never   Tobacco comments:    smoked for 1 yr socially in college  Substance Use Topics   Alcohol use: Yes    Comment: social-occ    Subjective:   Patient requesting to have dog bite on inner left arm evaluated; had to break up a fight between her dogs yesterday and got bitten in the process;  Tdap does need to be updated;  Allergic to PCN and FQ;      Objective:  Vitals:   07/21/21 1126  BP: 115/74  Pulse: 74  Resp: 16  Temp: 98 F (36.7 C)  TempSrc: Oral  SpO2: 98%  Weight: 280 lb (127 kg)  Height: 5\' 4"  (1.626 m)    General: Well developed, well nourished, in no acute distress   Skin : Warm and dry. 2 puncture wounds noted on left forearm-; medial bite does have some mild surrounding redness;  Head: Normocephalic and atraumatic  Lungs: Respirations unlabored;  Neurologic: Alert and oriented; speech intact; face symmetrical; moves all extremities well; CNII-XII intact without focal deficit   Assessment:  1. Cellulitis of left upper extremity   2. Need for diphtheria-tetanus-pertussis (Tdap) vaccine     Plan:  Secondary to dog bite; Tdap updated; Rx for Doxycycline 100 mg bid x 7 days; follow up worse, no better.   No follow-ups on file.  Orders Placed This Encounter  Procedures   Tdap vaccine greater than or equal to 7yo IM   Tdap vaccine greater than or equal to 7yo IM    Requested Prescriptions   Signed Prescriptions Disp Refills   doxycycline (VIBRA-TABS) 100 MG tablet 14 tablet 0    Sig: Take 1 tablet (100 mg total) by mouth 2 (two) times daily.

## 2021-07-24 ENCOUNTER — Telehealth (HOSPITAL_COMMUNITY): Payer: Self-pay | Admitting: *Deleted

## 2021-07-24 NOTE — Telephone Encounter (Signed)
Attempted to call patient regarding upcoming cardiac CT appointment. °Left message on voicemail with name and callback number ° °Shaynna Husby RN Navigator Cardiac Imaging °[facility] °[phone] Office °[phone] Cell ° °

## 2021-07-26 ENCOUNTER — Telehealth (HOSPITAL_COMMUNITY): Payer: Self-pay | Admitting: *Deleted

## 2021-07-26 NOTE — Telephone Encounter (Signed)
Reaching out to patient to offer assistance regarding upcoming cardiac imaging study; pt verbalizes understanding of appt date/time, parking situation and where to check in, pre-test NPO status and medications ordered, and verified current allergies; name and call back number provided for further questions should they arise  Danielly Ackerley RN Navigator Cardiac Imaging [facility] [phone] office [phone] cell  Patient to take 100mg metoprolol tartrate two hours prior to her cardiac CT scan.  She is aware to arrive at 8am. 

## 2021-07-27 ENCOUNTER — Ambulatory Visit (HOSPITAL_COMMUNITY)
Admission: RE | Admit: 2021-07-27 | Discharge: 2021-07-27 | Disposition: A | Discharge status: Home / Self Care | Payer: BC Managed Care – PPO | Source: Ambulatory Visit | Attending: Cardiology | Admitting: Cardiology

## 2021-07-27 ENCOUNTER — Encounter (HOSPITAL_COMMUNITY): Payer: Self-pay

## 2021-07-27 DIAGNOSIS — R079 Chest pain, unspecified: Secondary | ICD-10-CM | POA: Insufficient documentation

## 2021-07-27 MED ORDER — NITROGLYCERIN 0.4 MG SL SUBL
0.8000 mg | SUBLINGUAL_TABLET | Freq: Once | SUBLINGUAL | Status: AC
  Administered 2021-07-27: 0.8 mg via SUBLINGUAL

## 2021-07-27 MED ORDER — IOHEXOL 350 MG/ML SOLN
100.0000 mL | Freq: Once | INTRAVENOUS | Status: AC | PRN
  Administered 2021-07-27: 100 mL via INTRAVENOUS

## 2021-07-27 MED ORDER — NITROGLYCERIN 0.4 MG SL SUBL
SUBLINGUAL_TABLET | SUBLINGUAL | Status: AC
  Filled 2021-07-27: qty 2

## 2021-08-04 ENCOUNTER — Encounter: Payer: Self-pay | Admitting: Cardiology

## 2021-08-04 MED ORDER — ROSUVASTATIN CALCIUM 5 MG PO TABS: 5.0000 mg | ORAL_TABLET | Freq: Every evening | ORAL | 3 refills | Status: DC

## 2021-08-30 ENCOUNTER — Encounter (INDEPENDENT_AMBULATORY_CARE_PROVIDER_SITE_OTHER): Payer: Self-pay

## 2021-09-22 ENCOUNTER — Telehealth: Payer: Self-pay | Admitting: Family

## 2021-09-22 DIAGNOSIS — R160 Hepatomegaly, not elsewhere classified: Secondary | ICD-10-CM

## 2021-09-22 DIAGNOSIS — K769 Liver disease, unspecified: Secondary | ICD-10-CM

## 2021-09-22 NOTE — Telephone Encounter (Signed)
Please advise pt that she is due for a 3 month follow up MRI to re-evaluate the spot in her liver.  Order placed.   Dr. Abner Greenspan- FYI.

## 2021-09-22 NOTE — Telephone Encounter (Signed)
Lvm for patient to be aware of this information

## 2021-10-02 ENCOUNTER — Telehealth: Payer: Self-pay | Admitting: Family

## 2021-10-02 NOTE — Telephone Encounter (Signed)
See mychart.  

## 2021-10-09 ENCOUNTER — Other Ambulatory Visit: Payer: Self-pay | Admitting: Family Medicine

## 2021-10-09 DIAGNOSIS — F418 Other specified anxiety disorders: Secondary | ICD-10-CM

## 2021-10-18 NOTE — Assessment & Plan Note (Signed)
Encouraged DASH or MIND diet, decrease po intake and increase exercise as tolerated. Needs 7-8 hours of sleep nightly. Avoid trans fats, eat small, frequent meals every 4-5 hours with lean proteins, complex carbs and healthy fats. Minimize simple carbs, high fat foods and processed foods 

## 2021-10-18 NOTE — Assessment & Plan Note (Signed)
Encourage heart healthy diet such as MIND or DASH diet, increase exercise, avoid trans fats, simple carbohydrates and processed foods, consider a krill or fish or flaxseed oil cap daily. Tolerating statin 

## 2021-10-18 NOTE — Assessment & Plan Note (Signed)
Supplement and monitor 

## 2021-10-18 NOTE — Assessment & Plan Note (Signed)
Avoid offending foods, start probiotics. Do not eat large meals in late evening and consider raising head of bed.  

## 2021-10-19 ENCOUNTER — Telehealth (INDEPENDENT_AMBULATORY_CARE_PROVIDER_SITE_OTHER): Payer: BC Managed Care – PPO | Admitting: Family Medicine

## 2021-10-19 DIAGNOSIS — K219 Gastro-esophageal reflux disease without esophagitis: Secondary | ICD-10-CM | POA: Diagnosis not present

## 2021-10-19 DIAGNOSIS — E785 Hyperlipidemia, unspecified: Secondary | ICD-10-CM

## 2021-10-19 DIAGNOSIS — E559 Vitamin D deficiency, unspecified: Secondary | ICD-10-CM

## 2021-10-19 DIAGNOSIS — Z6841 Body Mass Index (BMI) 40.0 and over, adult: Secondary | ICD-10-CM

## 2021-10-19 DIAGNOSIS — R160 Hepatomegaly, not elsewhere classified: Secondary | ICD-10-CM

## 2021-10-19 NOTE — Progress Notes (Signed)
MyChart Video Visit Virtual Visit via Video Note   This visit type was conducted due to national recommendations for restrictions regarding the COVID-19 Pandemic (e.g. social distancing) in an effort to limit this patient's exposure and mitigate transmission in our community. This patient is at least at moderate risk for complications without adequate follow up. This format is felt to be most appropriate for this patient at this time. Physical exam was limited by quality of the video and audio technology used for the visit. Kristine Garbe, CMA was able to get the patient set up on a video visit.  Patient location: Patient at home and provider in visit Provider location: Office  I discussed the limitations of evaluation and management by telemedicine and the availability of in person appointments. The patient expressed understanding and agreed to proceed.  Visit Date: 10/19/2021  Today's healthcare provider: Penni Homans, MD     Subjective:    Patient ID: Selena Flores, female    DOB: 1980/07/29, 41 y.o.   MRN: [id number]  Chief Complaint  Patient presents with   6 month follow up    Concerns/ questions: 1. discuss opinion on MRI 2. discuss last Card OV   HPI Patient is in today for a video visit.  Chest Pain: She reports that she has been experiencing chest pain for several months. She has been seeing her cardiologist Dr. Harriet Masson to manage this and had a CT scan. She further reports that she was prescribed Rosuvastatin 5 mg and has a follow up appointment with Dr. Harriet Masson next month.  MRI: She is inquiring about the MRI results of her liver.   Hormonal Birth Control: She is concerned that her hormonal birth control could correlate to heart disease and atherosclerosis. She states that she has been taking hormonal birth control for several years but denies any family history of blood clots.  Immunizations: She is interested in receiving COVID-19 and Flu immunizations.   Past  Medical History:  Diagnosis Date   Allergy    Anxiety    Anxiety disorder 02/17/2015   Chronic kidney disease    kidney infection 2008 ish   Constipation    Depression    Esophageal reflux 02/17/2015   Food allergy    onions itching   GERD (gastroesophageal reflux disease)    Hyperlipidemia    Intermittent palpitations 02/25/2018   Melasma 02/17/2015   Migraines    Morbid obesity ([place]) 07/10/2017   Morbid obesity with BMI of 45.0-49.9, adult ([place]) 02/17/2015   Obsessive compulsive personality disorder ([place]) 02/17/2015   Patellofemoral syndrome 02/17/2015   Plantar fasciitis 11/28/2017   Preventative health care 10/15/2017   GYN: Dr Armando Reichert OB/[facility]   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   Vitamin D deficiency 11/28/2017   Past Surgical History:  Procedure Laterality Date   CERVICAL CONE BIOPSY     TONSILLECTOMY     Family History  Problem Relation Age of Onset   Heart disease Mother        chf   Obesity Mother    Anxiety disorder Mother    Heart failure Mother    Skin cancer Father    Cancer Father        skin cancer   Heart disease Maternal Grandmother    Heart disease Maternal Uncle    Social History   Socioeconomic History   Marital status: Married    Spouse name: Antonya Leeder   Number of children: 0   Years of education: Not  on file   Highest education level: Not on file  Occupational History   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   Tobacco Use   Smoking status: Never   Smokeless tobacco: Never   Tobacco comments:    smoked for 1 yr socially in college  Vaping Use   Vaping Use: Never used  Substance and Sexual Activity   Alcohol use: Yes    Comment: social-occ   Drug use: Never   Sexual activity: Yes    Birth control/protection: Pill  Other Topics Concern   Not on file  Social History Narrative   Lives with boyfriend, has 3 dogs, works in Therapist, 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     calcium carbonate (TUMS - DOSED IN MG ELEMENTAL CALCIUM) 500 MG chewable tablet Chew 1 tablet by mouth daily as needed for indigestion or heartburn.     cetirizine (ZYRTEC) 10 MG tablet Take 1 tablet (10 mg total) by mouth daily. 30 tablet 11   Norgestimate-Ethinyl Estradiol Triphasic 0.18/0.215/0.25 MG-35 MCG tablet Take 1 tablet by mouth daily.     pantoprazole (PROTONIX) 40 MG tablet Take 1 tablet (40 mg total) by mouth daily. 30 tablet 3   rosuvastatin (CRESTOR) 5 MG tablet Take 1 tablet (5 mg total) by mouth at bedtime. 90 tablet 3   sertraline (ZOLOFT) 100 MG tablet TAKE 1/2 TABLET EVERY      MORNING AND 1 TABLET EVERY EVENING 135 tablet 1   doxycycline (VIBRA-TABS) 100 MG tablet Take 1 tablet (100 mg total) by mouth 2 (two) times daily. (Patient not taking: Reported on 10/19/2021) 14 tablet 0   metoprolol tartrate (LOPRESSOR) 100 MG tablet Take 2 hours prior to CT (Patient not taking: Reported on 10/19/2021) 1 tablet 0   No facility-administered medications prior to visit.   Allergies  Allergen Reactions   Ciprofloxacin Itching   Rocephin [Ceftriaxone] Itching   Penicillins Itching and Rash    Has patient had a PCN reaction causing immediate rash, facial/tongue/throat swelling, SOB or lightheadedness with hypotension: Unknown Has patient had a PCN reaction causing severe rash involving mucus membranes or skin necrosis: Unknown Has patient had a PCN reaction that required  hospitalization Unknown Has patient had a PCN reaction occurring within the last 10 years: Yes If all of the above answers are "NO", then may proceed with Cephalosporin use.    ROS    Objective:    Physical Exam  There were no vitals taken for this visit. Wt Readings from Last 3 Encounters:  07/21/21 280 lb (127 kg)  07/04/21 279 lb 6.4 oz (126.7 kg)  06/30/21 279 lb (126.6 kg)   Diabetic Foot Exam - Simple   No data filed    Lab Results  Component Value Date   WBC 9.6 04/18/2021   HGB 13.6 04/18/2021   HCT 41.1 04/18/2021   PLT 353.0 04/18/2021   GLUCOSE 80 07/04/2021   CHOL 260 (H) 04/18/2021   TRIG  123.0 04/18/2021   HDL 82.90 04/18/2021   LDLCALC 152 (H) 04/18/2021   ALT 10 04/18/2021   AST 13 04/18/2021   NA 138 07/04/2021   K 4.9 07/04/2021   CL 100 07/04/2021   CREATININE 0.80 07/04/2021   BUN 19 07/04/2021   CO2 26 07/04/2021   TSH 2.54 04/18/2021   INR 0.9 05/12/2008   HGBA1C 5.5 04/18/2021   Lab Results  Component Value Date   TSH 2.54 04/18/2021   Lab Results  Component Value Date   WBC 9.6 04/18/2021   HGB 13.6 04/18/2021   HCT 41.1 04/18/2021   MCV 88.5 04/18/2021   PLT 353.0 04/18/2021   Lab Results  Component Value Date   NA 138 07/04/2021   K 4.9 07/04/2021   CO2 26 07/04/2021   GLUCOSE 80 07/04/2021   BUN 19 07/04/2021   CREATININE 0.80 07/04/2021   BILITOT 0.3 04/18/2021   ALKPHOS 106 04/18/2021   AST 13 04/18/2021   ALT 10 04/18/2021   PROT 7.5 04/18/2021   ALBUMIN 3.7 04/18/2021   CALCIUM 9.5 07/04/2021   ANIONGAP 10 04/06/2015   EGFR 97 07/04/2021   GFR 92.09 04/18/2021   Lab Results  Component Value Date   CHOL 260 (H) 04/18/2021   Lab Results  Component Value Date   HDL 82.90 04/18/2021   Lab Results  Component Value Date   LDLCALC 152 (H) 04/18/2021   Lab Results  Component Value Date   TRIG 123.0 04/18/2021   Lab Results  Component Value Date   CHOLHDL 3 04/18/2021   Lab Results  Component Value  Date   HGBA1C 5.5 04/18/2021      Assessment & Plan:   Problem List Items Addressed This Visit     Hyperlipidemia    Encourage heart healthy diet such as MIND or DASH diet, increase exercise, avoid trans fats, simple carbohydrates and processed foods, consider a krill or fish or flaxseed oil cap daily. Tolerating statin      Vitamin D deficiency    Supplement and monitor      Esophageal reflux    Avoid offending foods, start probiotics. Do not eat large meals in late evening and consider raising head of bed.       Morbid obesity with BMI of 45.0-49.9, adult ([place])    Encouraged DASH or MIND diet, decrease po intake and increase exercise as tolerated. Needs 7-8 hours of sleep nightly. Avoid trans fats, eat small, frequent meals every 4-5 hours with lean proteins, complex carbs and healthy fats. Minimize simple carbs, high fat foods and processed foods      Liver mass    Patient agrees to proceed with dedicated liver MRI after discussing the importance of ruling out malignancy with follow up imaging.       No orders of the defined types were placed in this encounter.  I discussed the assessment and treatment plan with the patient. The patient was provided an opportunity to ask questions and all were answered. The patient agreed with the plan and demonstrated an understanding of the instructions.   The patient was advised to call back or seek an in-person evaluation if the symptoms worsen or if the condition fails to improve as anticipated.  I provided 20 minutes of face-to-face time during this encounter.  I,Mohammed Iqbal,acting as a scribe for Penni Homans, MD.,have documented all relevant documentation on the behalf of Penni Homans, MD,as directed by  Penni Homans, MD while in the presence of [place]  Charlett Blake, MD.  Penni Homans, MD [facility] at [facility] [phone] (phone) [phone] (fax)  [place]

## 2021-10-20 ENCOUNTER — Telehealth (HOSPITAL_BASED_OUTPATIENT_CLINIC_OR_DEPARTMENT_OTHER): Payer: Self-pay

## 2021-10-20 NOTE — Assessment & Plan Note (Signed)
Patient agrees to proceed with dedicated liver MRI after discussing the importance of ruling out malignancy with follow up imaging.

## 2021-11-06 ENCOUNTER — Ambulatory Visit (INDEPENDENT_AMBULATORY_CARE_PROVIDER_SITE_OTHER): Payer: BC Managed Care – PPO | Admitting: Family

## 2021-11-06 VITALS — BP 124/86 | HR 84 | Temp 98.2°F | Resp 16 | Wt 287.0 lb

## 2021-11-06 DIAGNOSIS — R350 Frequency of micturition: Secondary | ICD-10-CM | POA: Diagnosis not present

## 2021-11-06 LAB — POC URINALSYSI DIPSTICK (AUTOMATED)
Bilirubin, UA: NEGATIVE
Glucose, UA: NEGATIVE
Ketones, UA: NEGATIVE
Nitrite, UA: NEGATIVE
Protein, UA: NEGATIVE
Spec Grav, UA: <=1.005 — AB (ref 1.010–1.025)
Urobilinogen, UA: 0.2 E.U./dL
pH, UA: 6 (ref 5.0–8.0)

## 2021-11-06 MED ORDER — NITROFURANTOIN MONOHYD MACRO 100 MG PO CAPS: 100.0000 mg | ORAL_CAPSULE | Freq: Two times a day (BID) | ORAL | 0 refills | Status: DC

## 2021-11-06 NOTE — Progress Notes (Signed)
Subjective:     Patient ID: Selena Flores, female    DOB: 01/30/1980, 41 y.o.   MRN: [id number]  Chief Complaint  Patient presents with   Urinary Frequency    Patient complains of urinary frequency    Urinary Frequency  Associated symptoms include frequency.   Patient is in today with c/o urinary frequency. Started 1 week ago. Denies fever, hematuria, low back pain. Denies dysuria.  Last UTI was 10 years ago.   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   Patellofemoral syndrome 02/17/2015   Plantar fasciitis 11/28/2017   Preventative health care 10/15/2017   GYN: Dr Tami Ribas OB/GYN   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   Vitamin D deficiency 11/28/2017    Past Surgical History:  Procedure Laterality Date   CERVICAL CONE BIOPSY     TONSILLECTOMY      Family History  Problem Relation Age of Onset   Heart disease Mother        chf   Obesity Mother    Anxiety disorder Mother    Heart failure Mother    Skin cancer Father    Cancer Father        skin cancer   Heart disease Maternal Grandmother    Heart disease Maternal Uncle     Social History   Socioeconomic History   Marital status: Married    Spouse name: Shawntel Farnworth   Number of children: 0   Years of education: Not on file   Highest education level: Not on file  Occupational History   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   Tobacco Use    Smoking status: Never   Smokeless tobacco: Never   Tobacco comments:    smoked for 1 yr socially in college  Vaping Use   Vaping Use: Never used  Substance and Sexual Activity   Alcohol use: Yes    Comment: social-occ   Drug use: Never   Sexual activity: Yes    Birth control/protection: Pill  Other Topics Concern   Not on file  Social History Narrative   Lives with boyfriend, has 3 dogs, works in Clinical biochemist with [employer]   No dietary restrictions, walking   Social Determinants of Corporate investment banker Strain: Not on file  Food Insecurity: Not on file  Transportation Needs: Not on file  Physical Activity: Not on file  Stress: Not on file  Social Connections: Not on file  Intimate Partner Violence: Not on file    Outpatient Medications Prior to Visit  Medication Sig Dispense Refill   aspirin-acetaminophen-caffeine (EXCEDRIN MIGRAINE) 250-250-65 MG tablet Take 1 tablet by mouth every 6 (six) hours as needed for headache.     calcium carbonate (TUMS - DOSED IN MG ELEMENTAL CALCIUM) 500 MG chewable tablet Chew 1 tablet by mouth daily as needed for indigestion or heartburn.     cetirizine (ZYRTEC) 10  MG tablet Take 1 tablet (10 mg total) by mouth daily. 30 tablet 11   Norgestimate-Ethinyl Estradiol Triphasic 0.18/0.215/0.25 MG-35 MCG tablet Take 1 tablet by mouth daily.     pantoprazole (PROTONIX) 40 MG tablet Take 1 tablet (40 mg total) by mouth daily. 30 tablet 3   rosuvastatin (CRESTOR) 5 MG tablet Take 1 tablet (5 mg total) by mouth at bedtime. 90 tablet 3   sertraline (ZOLOFT) 100 MG tablet TAKE 1/2 TABLET EVERY      MORNING AND 1 TABLET EVERY EVENING 135 tablet 1   No facility-administered medications prior to visit.    Allergies  Allergen Reactions   Ciprofloxacin Itching   Rocephin [Ceftriaxone] Itching   Penicillins Itching and Rash    Has patient had a PCN reaction causing immediate rash, facial/tongue/throat swelling, SOB or lightheadedness with  hypotension: Unknown Has patient had a PCN reaction causing severe rash involving mucus membranes or skin necrosis: Unknown Has patient had a PCN reaction that required hospitalization Unknown Has patient had a PCN reaction occurring within the last 10 years: Yes If all of the above answers are "NO", then may proceed with Cephalosporin use.     Review of Systems  Genitourinary:  Positive for frequency.   See HPI    Objective:    Physical Exam Constitutional:      General: She is not in acute distress.    Appearance: Normal appearance. She is well-developed.  HENT:     Head: Normocephalic and atraumatic.     Right Ear: External ear normal.     Left Ear: External ear normal.  Eyes:     General: No scleral icterus. Neck:     Thyroid: No thyromegaly.  Cardiovascular:     Rate and Rhythm: Normal rate and regular rhythm.     Heart sounds: Normal heart sounds. No murmur heard. Pulmonary:     Effort: Pulmonary effort is normal. No respiratory distress.     Breath sounds: Normal breath sounds. No wheezing.  Abdominal:     Palpations: Abdomen is soft.     Tenderness: There is no abdominal tenderness. There is no right CVA tenderness or left CVA tenderness.  Musculoskeletal:     Cervical back: Neck supple.  Skin:    General: Skin is warm and dry.  Neurological:     Mental Status: She is alert and oriented to person, place, and time.  Psychiatric:        Mood and Affect: Mood normal.        Behavior: Behavior normal.        Thought Content: Thought content normal.        Judgment: Judgment normal.     BP 124/86 (BP Location: Right Arm, Patient Position: Sitting, Cuff Size: Large)   Pulse 84   Temp 98.2 F (36.8 C) (Oral)   Resp 16   Wt 287 lb (130.2 kg)   SpO2 99%   BMI 49.26 kg/m  Wt Readings from Last 3 Encounters:  11/06/21 287 lb (130.2 kg)  07/21/21 280 lb (127 kg)  07/04/21 279 lb 6.4 oz (126.7 kg)       Assessment & Plan:   Problem List Items Addressed  This Visit       Unprioritized   Frequent urination - Primary    New. UA suggestive of UTI. Will send urine for culture and begin empiric macrobid bid x 5 days.       Relevant Orders   POCT Urinalysis Dipstick (Automated) (Completed)  Urine Culture    I am having Keyuna L. Mcadam start on nitrofurantoin (macrocrystal-monohydrate). I am also having her maintain her Norgestimate-Ethinyl Estradiol Triphasic, aspirin-acetaminophen-caffeine, calcium carbonate, cetirizine, pantoprazole, rosuvastatin, and sertraline.  Meds ordered this encounter  Medications   nitrofurantoin, macrocrystal-monohydrate, (MACROBID) 100 MG capsule    Sig: Take 1 capsule (100 mg total) by mouth 2 (two) times daily.    Dispense:  10 capsule    Refill:  0    Order Specific Question:   Supervising Provider    Answer:   Danise Edge A [[id number]]

## 2021-11-06 NOTE — Assessment & Plan Note (Signed)
New. UA suggestive of UTI. Will send urine for culture and begin empiric macrobid bid x 5 days.

## 2021-11-07 LAB — URINE CULTURE
MICRO NUMBER:: 14055066
SPECIMEN QUALITY:: ADEQUATE

## 2021-12-06 ENCOUNTER — Encounter: Payer: Self-pay | Admitting: Cardiology

## 2022-01-01 NOTE — Assessment & Plan Note (Signed)
Encouraged DASH or MIND diet, decrease po intake and increase exercise as tolerated. Needs 7-8 hours of sleep nightly. Avoid trans fats, eat small, frequent meals every 4-5 hours with lean proteins, complex carbs and healthy fats. Minimize simple carbs, high fat foods and processed foods 

## 2022-01-01 NOTE — Assessment & Plan Note (Signed)
On Sertraline 100 mg tabs 1/2 tab in am and 1 tab in pm

## 2022-01-01 NOTE — Assessment & Plan Note (Signed)
Encourage heart healthy diet such as MIND or DASH diet, increase exercise, avoid trans fats, simple carbohydrates and processed foods, consider a krill or fish or flaxseed oil cap daily.  °

## 2022-01-01 NOTE — Assessment & Plan Note (Signed)
Supplement and monitor 

## 2022-01-02 ENCOUNTER — Telehealth (INDEPENDENT_AMBULATORY_CARE_PROVIDER_SITE_OTHER): Payer: BC Managed Care – PPO | Admitting: Family Medicine

## 2022-01-02 DIAGNOSIS — F605 Obsessive-compulsive personality disorder: Secondary | ICD-10-CM

## 2022-01-02 DIAGNOSIS — F429 Obsessive-compulsive disorder, unspecified: Secondary | ICD-10-CM

## 2022-01-02 DIAGNOSIS — F419 Anxiety disorder, unspecified: Secondary | ICD-10-CM | POA: Diagnosis not present

## 2022-01-02 DIAGNOSIS — F418 Other specified anxiety disorders: Secondary | ICD-10-CM

## 2022-01-02 DIAGNOSIS — E559 Vitamin D deficiency, unspecified: Secondary | ICD-10-CM | POA: Diagnosis not present

## 2022-01-02 DIAGNOSIS — E785 Hyperlipidemia, unspecified: Secondary | ICD-10-CM

## 2022-01-02 DIAGNOSIS — Z6841 Body Mass Index (BMI) 40.0 and over, adult: Secondary | ICD-10-CM

## 2022-01-02 DIAGNOSIS — Z0000 Encounter for general adult medical examination without abnormal findings: Secondary | ICD-10-CM

## 2022-01-02 MED ORDER — HYDROXYZINE HCL 10 MG PO TABS: 10.0000 mg | ORAL_TABLET | Freq: Three times a day (TID) | ORAL | 0 refills | Status: AC | PRN

## 2022-01-02 MED ORDER — SERTRALINE HCL 100 MG PO TABS: 200.0000 mg | ORAL_TABLET | Freq: Every day | ORAL | 1 refills | Status: DC

## 2022-01-02 NOTE — Patient Instructions (Signed)
Managing Obsessive-Compulsive Disorder If you have been diagnosed with obsessive-compulsive disorder (OCD), you may be relieved that you now know why you have felt or behaved a certain way. You may also feel overwhelmed about the treatment ahead, how to get the support you need, and how to deal with the condition day-to-day. With treatment and support, you can manage your OCD. How to manage lifestyle changes Managing stress Stress is your body's reaction to life changes and events, both good and bad. Stress can play a major role in OCD, so it is important to learn how to manage stress. Some techniques to manage stress include: Meditation, muscle relaxation, and breathing exercises. Exercise. Even a short daily walk can help to lower stress levels. Getting enough high-quality sleep. Spending time on hobbies that you enjoy. Accepting and letting go of things that you cannot change. To help you manage stress associated with OCD, your health care provider may recommend a type of behavior therapy called exposure and response prevention therapy. In this therapy, you will be exposed to the distressing situation that triggers your compulsion and be prevented from responding to it. With repetition of this process over time, you will no longer feel the distress or need to perform the compulsion. Medicines Your health care provider may suggest certain medicines for depression (antidepressants) if it is felt that they will help to improve your condition. It is important to: Talk with your pharmacist or health care provider about all medicines that you take, their possible side effects, and which medicines are safe to take together. Make it your goal to take part in all treatment decisions (shared decision-making). Ask about possible side effects of medicines that your health care provider recommends, and tell him or her how you feel about having those side effects. It is best if shared decision-making with your  health care provider is part of your total treatment plan. Avoid using alcohol and other substances that may prevent your medicines from working properly. If you are taking medicines as part of your treatment, do not stop taking them before you ask your health care provider if it is safe to stop. You may need to have the medicine slowly decreased (tapered) over time to lower the risk of harmful side effects. Relationships Consider giving education materials to friends and family. Your family and friends may need to learn about your OCD in order for them to understand you and support you as you manage your condition. Family therapy may also help to lower stress and relieve tension. How to recognize changes in your condition Some signs that your condition may be getting worse include: Being anxious about germs or dirt. Having harmful thoughts about yourself or others. Making sure that household objects are alike or perfectly organized in a specific way. Having great difficulty making decisions, or second-guessing yourself after making a decision. Constant cleaning and handwashing. Repeating behavior such as repeatedly checking to see if a door is locked or the oven is off. Counting nonstop or uncontrollably. Follow these instructions at home: Medicines Take over-the-counter and prescription medicines only as told by your health care provider. Check with your health care provider before starting any new prescription or over-the-counter medicines. General instructions  Ask for support from trusted family members or friends to make sure you stay on track with your treatment. Keep a journal to write down your daily moods, medicines, sleep habits, and life events. Doing this may help you have more success with your treatment. Maintain a healthy lifestyle.  This includes: Eating a healthy diet. Avoiding alcohol. Avoiding products that contain nicotine and tobacco. Exercising regularly. Getting  plenty of sleep. Taking time to relax. Keep all follow-up visits. This includes visits with your health care provider and therapist. This is important. Where to find support Talking to others It may be difficult to tell family members and friends about your condition, but they can be a good support system for you. You can work with your therapist to decide whom to tell and when to tell them. Here are some tips for starting the conversation: Start by sharing your experience with OCD. It is up to you how much detail you want to provide. Let your family and friends know that you are seeking treatment. Do not expect family and friends to understand your condition right away. Finances Not all insurance plans cover mental health care, so it is important to check with your insurance carrier. If paying for co-pays or counseling services is a problem, search for a local or county mental health care center. Public mental health care services may be offered there at a low cost or no cost when you are not able to see a private health care provider. If you are taking medicine for depression, you may be able to get the generic form, which may be less expensive than brand-name medicine. Some makers of prescription medicines also offer help to people who cannot afford the medicines they need. Questions to ask your health care provider If you are taking medicines: How long do I need to take medicine? Are there any long-term side effects of my medicine? Are there any alternatives to taking medicine? How would I benefit from therapy? How often should I follow up with a health care provider? Where to find more information International [employer] Foundation: www.iocdf.org National Alliance on Mental Illness (NAMI): www.nami.org Substance Abuse and Mental Health Services Administration (SAMHSA): www.samhsa.gov Contact a health care provider if: Your symptoms get worse or they do not get better with treatment. You  develop new symptoms. Get help right away if: You have severe side effects after taking your medicine. You have thoughts about hurting yourself or others. Get help right away if you feel like you may hurt yourself or others, or have thoughts about taking your own life. Go to your nearest emergency room or: Call 911. Call the National Suicide Prevention Lifeline at 1-800-273-8255 or 988. This is open 24 hours a day. Text the Crisis Text Line at 741741. Summary Stress can play a major role in obsessive-compulsive disorder (OCD). Learning ways to manage stress may help your treatment work better for you. If you are taking medicines as part of your treatment, do not stop taking medicines before you ask your health care provider if it is safe to stop. When talking with family members and friends about your OCD, decide how much detail you want to give them and be patient as they work to understand your condition. Keep all follow-up visits. This includes visits with your health care provider and therapist. This is important. This information is not intended to replace advice given to you by your health care provider. Make sure you discuss any questions you have with your health care provider. Document Revised: 10/03/2020 Document Reviewed: 10/03/2020 Elsevier Patient Education  2023 Elsevier Inc.  

## 2022-01-02 NOTE — Progress Notes (Signed)
MyChart Video Visit Virtual Visit via Video Note   This visit type was conducted due to national recommendations for restrictions regarding the COVID-19 Pandemic (e.g. social distancing) in an effort to limit this patient's exposure and mitigate transmission in our community. This patient is at least at moderate risk for complications without adequate follow up. This format is felt to be most appropriate for this patient at this time. Physical exam was limited by quality of the video and audio technology used for the visit. [name], CMA, was able to get the patient set up on a video visit.  Patient location: Home Provider location: Office  I discussed the limitations of evaluation and management by telemedicine and the availability of in person appointments. The patient expressed understanding and agreed to proceed.  Visit Date: 01/02/2022  Today's healthcare provider: Penni Homans, MD     Subjective:    Patient ID: Selena Flores, female    DOB: 10-25-1980, 41 y.o.   MRN: [id number]  No chief complaint on file.  HPI Patient is in today for a video visit. Denies CP/ palpitations/ SOB/ HA/ congestion/ fevers/ GI or GU c/o.  Anxiety/OCD Patient reports today with increased anxiety/OCD. 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 She also worries at night about having to drive the following day. She is currently taking Sertraline 150 mg daily and is interested in receiving a referral to therapy. She chooses not to start Alprazolam and Buspirone, but is interested in starting Hydroxyzine.  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   Patellofemoral syndrome 02/17/2015   Plantar fasciitis 11/28/2017   Preventative health care 10/15/2017   GYN: Dr [facility]   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   Vitamin D deficiency 11/28/2017   Past Surgical History:  Procedure Laterality Date   CERVICAL CONE BIOPSY     TONSILLECTOMY     Family History  Problem Relation Age of Onset   Heart disease Mother        chf   Obesity Mother    Anxiety disorder Mother    Heart failure Mother    Skin cancer Father    Cancer Father        skin cancer   Heart disease Maternal Grandmother    Heart disease Maternal Uncle    Social History   Socioeconomic History   Marital status: Married    Spouse name: Leiani Enright   Number of children: 0   Years of education: Not on file   Highest education level: Not on file  Occupational History   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   Tobacco Use   Smoking status: Never   Smokeless tobacco: Never   Tobacco comments:    smoked for 1 yr socially in college  Vaping Use   Vaping Use: Never used  Substance and Sexual  Activity   Alcohol use: Yes    Comment: social-occ   Drug use: Never   Sexual activity: Yes    Birth control/protection: Pill  Other Topics Concern   Not on file  Social History Narrative   Lives with boyfriend, has 3 dogs, works in Therapist, art with [employer]   No dietary restrictions, walking   Social Determinants of Radio broadcast assistant Strain: Not on file  Food Insecurity: Not on file  Transportation Needs: Not on file  Physical Activity: Not on file  Stress: Not on file  Social Connections: Not on file  Intimate Partner Violence: Not on file    Outpatient Medications Prior to Visit  Medication Sig Dispense Refill   aspirin-acetaminophen-caffeine (EXCEDRIN MIGRAINE) 250-250-65 MG tablet Take 1 tablet by mouth every 6 (six) hours as needed for headache.     calcium carbonate (TUMS - DOSED IN MG ELEMENTAL CALCIUM) 500 MG chewable tablet Chew 1 tablet by mouth daily as needed for indigestion or heartburn.     cetirizine (ZYRTEC) 10 MG tablet Take 1 tablet (10 mg total) by mouth daily. 30 tablet 11   nitrofurantoin, macrocrystal-monohydrate, (MACROBID) 100 MG capsule Take 1 capsule (100 mg total) by mouth 2 (two) times daily. 10 capsule 0   Norgestimate-Ethinyl Estradiol Triphasic 0.18/0.215/0.25 MG-35 MCG tablet Take 1 tablet by mouth daily.     pantoprazole (PROTONIX) 40 MG tablet Take 1 tablet (40 mg total) by mouth daily. 30 tablet 3   rosuvastatin (CRESTOR) 5 MG tablet Take 1 tablet (5 mg total) by mouth at bedtime. 90 tablet 3   sertraline (ZOLOFT) 100 MG tablet TAKE 1/2 TABLET EVERY      MORNING AND 1 TABLET EVERY EVENING 135 tablet 1   No facility-administered medications prior to visit.   Allergies  Allergen Reactions   Ciprofloxacin Itching   Rocephin [Ceftriaxone] Itching   Penicillins Itching and Rash    Has patient had a PCN reaction causing immediate rash, facial/tongue/throat swelling, SOB or lightheadedness with hypotension: Unknown Has patient had a PCN reaction causing severe rash involving mucus membranes or skin necrosis: Unknown Has patient had a PCN reaction that required hospitalization Unknown Has patient had a PCN reaction occurring within the last 10 years: Yes If all of the above answers are "NO", then may proceed with Cephalosporin use.   Review of Systems  Constitutional:  Negative for chills and fever.  HENT:  Negative for congestion.   Respiratory:  Negative for shortness of breath.   Cardiovascular:  Negative for chest pain and palpitations.  Gastrointestinal:  Negative for abdominal pain, blood  in stool, constipation, diarrhea, nausea and vomiting.  Genitourinary:  Negative for dysuria, frequency, hematuria and urgency.  Skin:           Neurological:  Negative for headaches.  Psychiatric/Behavioral:  The patient is nervous/anxious.        OCD      Objective:    Physical Exam  There were no vitals taken for this visit. Wt Readings from Last 3 Encounters:  11/06/21 287 lb (130.2 kg)  07/21/21 280 lb (127 kg)  07/04/21 279 lb 6.4 oz (126.7 kg)   Diabetic Foot Exam - Simple   No data filed    Lab Results  Component Value Date   WBC 9.6 04/18/2021   HGB 13.6 04/18/2021   HCT 41.1 04/18/2021   PLT 353.0 04/18/2021   GLUCOSE 80 07/04/2021   CHOL 260 (H) 04/18/2021   TRIG 123.0 04/18/2021  HDL 82.90 04/18/2021   LDLCALC 152 (H) 04/18/2021   ALT 10 04/18/2021   AST 13 04/18/2021   NA 138 07/04/2021   K 4.9 07/04/2021   CL 100 07/04/2021   CREATININE 0.80 07/04/2021   BUN 19 07/04/2021   CO2 26 07/04/2021   TSH 2.54 04/18/2021   INR 0.9 05/12/2008   HGBA1C 5.5 04/18/2021   Lab Results  Component Value Date   TSH 2.54 04/18/2021   Lab Results  Component Value Date   WBC 9.6 04/18/2021   HGB 13.6 04/18/2021   HCT 41.1 04/18/2021   MCV 88.5 04/18/2021   PLT 353.0 04/18/2021   Lab Results  Component Value Date   NA 138 07/04/2021   K 4.9 07/04/2021   CO2 26 07/04/2021   GLUCOSE 80 07/04/2021   BUN 19 07/04/2021   CREATININE 0.80 07/04/2021   BILITOT 0.3 04/18/2021   ALKPHOS 106 04/18/2021   AST 13 04/18/2021   ALT 10 04/18/2021   PROT 7.5 04/18/2021   ALBUMIN 3.7 04/18/2021   CALCIUM 9.5 07/04/2021   ANIONGAP 10 04/06/2015   EGFR 97 07/04/2021   GFR 92.09 04/18/2021   Lab Results  Component Value Date   CHOL 260 (H) 04/18/2021   Lab Results  Component Value Date   HDL 82.90 04/18/2021   Lab Results  Component Value Date   LDLCALC 152 (H) 04/18/2021   Lab Results  Component Value Date   TRIG 123.0 04/18/2021   Lab Results   Component Value Date   CHOLHDL 3 04/18/2021   Lab Results  Component Value Date   HGBA1C 5.5 04/18/2021      Assessment & Plan:   Hydroxyzine 10 mg has been prescribed, Sertraline has been increased to 200 mg, and referral to therapy has been made today. Patient has been provided with the [email or web address] website.  Problem List Items Addressed This Visit       Other   Hyperlipidemia - Primary    Encourage heart healthy diet such as MIND or DASH diet, increase exercise, avoid trans fats, simple carbohydrates and processed foods, consider a krill or fish or flaxseed oil cap daily.        Anxiety disorder    On Sertraline 100 mg tabs 1/2 tab in am and 1 tab in pm      Vitamin D deficiency    Supplement and monitor       Morbid obesity with BMI of 45.0-49.9, adult ([place])    Encouraged DASH or MIND diet, decrease po intake and increase exercise as tolerated. Needs 7-8 hours of sleep nightly. Avoid trans fats, eat small, frequent meals every 4-5 hours with lean proteins, complex carbs and healthy fats. Minimize simple carbs, high fat foods and processed foods       No orders of the defined types were placed in this encounter.  I discussed the assessment and treatment plan with the patient. The patient was provided an opportunity to ask questions and all were answered. The patient agreed with the plan and demonstrated an understanding of the instructions.   The patient was advised to call back or seek an in-person evaluation if the symptoms worsen or if the condition fails to improve as anticipated.  I provided 20 minutes of face-to-face time during this encounter.  I,Mohammed Iqbal,acting as a scribe for Penni Homans, MD.,have documented all relevant documentation on the behalf of Penni Homans, MD,as directed by  Penni Homans, MD while in the presence of Penni Homans, MD.  Penni Homans, MD [facility] [phone]  (phone) [phone] (fax)  [place]

## 2022-01-03 NOTE — Assessment & Plan Note (Signed)
She has struggled with a lifelong concern of excessive anxiety and she notes it has been manifesting while she drives. She has noted this before but it was better during because she could work from home but now she has been called back to the office and her anxiety while driving has escalated. She has noted she keeps having fears she has hit something and is compelled to turn around and check. We will increase her Sertraline to 200 mg daily, allowed some Hydroxyzine to use prn and is referred to behavioral health for counseling. She will request a work accommodation to not have to drive into the office until she has stabilized and we will support that request

## 2022-01-09 ENCOUNTER — Other Ambulatory Visit: Payer: BC Managed Care – PPO

## 2022-01-10 ENCOUNTER — Ambulatory Visit: Payer: BC Managed Care – PPO | Admitting: Cardiology

## 2022-01-16 ENCOUNTER — Other Ambulatory Visit (INDEPENDENT_AMBULATORY_CARE_PROVIDER_SITE_OTHER): Payer: BC Managed Care – PPO

## 2022-01-16 DIAGNOSIS — E785 Hyperlipidemia, unspecified: Secondary | ICD-10-CM | POA: Diagnosis not present

## 2022-01-16 DIAGNOSIS — Z0000 Encounter for general adult medical examination without abnormal findings: Secondary | ICD-10-CM | POA: Diagnosis not present

## 2022-01-16 DIAGNOSIS — E559 Vitamin D deficiency, unspecified: Secondary | ICD-10-CM | POA: Diagnosis not present

## 2022-01-16 LAB — COMPREHENSIVE METABOLIC PANEL
ALT: 11 U/L (ref 0–35)
AST: 15 U/L (ref 0–37)
Albumin: 3.3 g/dL — ABNORMAL LOW (ref 3.5–5.2)
Alkaline Phosphatase: 96 U/L (ref 39–117)
BUN: 21 mg/dL (ref 6–23)
CO2: 27 mEq/L (ref 19–32)
Calcium: 8.4 mg/dL (ref 8.4–10.5)
Chloride: 103 mEq/L (ref 96–112)
Creatinine, Ser: 0.9 mg/dL (ref 0.40–1.20)
GFR: 80.73 mL/min (ref >60.00)
Glucose, Bld: 91 mg/dL (ref 70–99)
Potassium: 4.8 mEq/L (ref 3.5–5.1)
Sodium: 137 mEq/L (ref 135–145)
Total Bilirubin: 0.2 mg/dL (ref 0.2–1.2)
Total Protein: 6.7 g/dL (ref 6.0–8.3)

## 2022-01-16 LAB — CBC WITH DIFFERENTIAL/PLATELET
Basophils Absolute: 0 10*3/uL (ref 0.0–0.1)
Basophils Relative: 0.4 % (ref 0.0–3.0)
Eosinophils Absolute: 0.1 10*3/uL (ref 0.0–0.7)
Eosinophils Relative: 1.8 % (ref 0.0–5.0)
HCT: 39.8 % (ref 36.0–46.0)
Hemoglobin: 13.5 g/dL (ref 12.0–15.0)
Lymphocytes Relative: 36.7 % (ref 12.0–46.0)
Lymphs Abs: 2.5 10*3/uL (ref 0.7–4.0)
MCHC: 33.8 g/dL (ref 30.0–36.0)
MCV: 87.5 fl (ref 78.0–100.0)
Monocytes Absolute: 0.5 10*3/uL (ref 0.1–1.0)
Monocytes Relative: 7.4 % (ref 3.0–12.0)
Neutro Abs: 3.7 10*3/uL (ref 1.4–7.7)
Neutrophils Relative %: 53.7 % (ref 43.0–77.0)
Platelets: 303 10*3/uL (ref 150.0–400.0)
RBC: 4.55 Mil/uL (ref 3.87–5.11)
RDW: 14.3 % (ref 11.5–15.5)
WBC: 6.9 10*3/uL (ref 4.0–10.5)

## 2022-01-16 LAB — TSH: TSH: 3.25 u[IU]/mL (ref 0.35–5.50)

## 2022-01-16 LAB — LIPID PANEL
Cholesterol: 192 mg/dL (ref 0–200)
HDL: 77.4 mg/dL (ref >39.00)
LDL Cholesterol: 94 mg/dL (ref 0–99)
NonHDL: 114.48
Total CHOL/HDL Ratio: 2
Triglycerides: 101 mg/dL (ref 0.0–149.0)
VLDL: 20.2 mg/dL (ref 0.0–40.0)

## 2022-01-16 LAB — VITAMIN D 25 HYDROXY (VIT D DEFICIENCY, FRACTURES): VITD: 31.37 ng/mL (ref 30.00–100.00)

## 2022-01-16 LAB — HEMOGLOBIN A1C: Hgb A1c MFr Bld: 5.6 % (ref 4.6–6.5)

## 2022-01-24 ENCOUNTER — Ambulatory Visit (INDEPENDENT_AMBULATORY_CARE_PROVIDER_SITE_OTHER): Payer: BC Managed Care – PPO | Admitting: Psychology

## 2022-01-24 DIAGNOSIS — F429 Obsessive-compulsive disorder, unspecified: Secondary | ICD-10-CM | POA: Diagnosis not present

## 2022-01-24 NOTE — Progress Notes (Signed)
? ? ? ? ? ? ? ? ? ? ? ? ? ? ?  Temesgen Weightman, LCMHC ?

## 2022-01-24 NOTE — Progress Notes (Signed)
[facility] Counselor Initial Adult Exam  Name: Selena Flores Date: 01/24/2022 MRN: [id number] DOB: 09-13-1980 PCP: Mosie Lukes, MD  Time spent: 8:00am-8:58am  Pt is seen for a virtual video visit via caregility.  Pt joins from her home, reporting privacy, and counselor from her home office.    Guardian/Payee:  self     Paperwork requested: No   Reason for Visit /Presenting Problem: "I have had anxiety and OCD for many years,  probably since a kid."  Pt has been taking sertraline for years. Pt struggled w/ obsessions about Germs and w/ meds had ceased. In 2019 decided to go off meds as in better situation-  Not w/ ex anymore.  w/ her PCP weaned down dose and didn't go well.  Pt reported had new OCD symptoms and worst anxiety.  Pt reports her new obsessive thought is around driving worrying that she has a pedestrian- checking and rechecking, driving back to double check and later would check police report "To make my brain fell better."  Pt started back on meds and things improved- no longer driving back or checking police reports- but never stopped. Pt reports she has worked for home for 4 years and had avoid ed driving.  But had to return to Hybrid work  3 days a week since December. Pt reports increased Panic because "I don't feel safe driving. " Pt reports other stressors include life changes in past year.  Had wedding last Jan, after honeymoon came home and decided to build a house.  Moved into house in December and now working on selling old house.   Stressor wedding, honeymoon and decided to build a house.  Selling old house now.    Mental Status Exam: Appearance:   Well Groomed     Behavior:  Appropriate  Motor:  Normal  Speech/Language:   Normal Rate  Affect:  Appropriate  Mood:  anxious  Thought process:  normal  Thought content:    WNL  Sensory/Perceptual disturbances:    WNL  Orientation:  oriented to person, place, time/date, and situation   Attention:  Good  Concentration:  Good  Memory:  WNL  Fund of knowledge:   Good  Insight:    Good  Judgment:   Good  Impulse Control:  Good   Reported Symptoms:  anxiety, thoughts of worst case scenarios/feeling something bad will happen.  Pt reports hx of OCD since young.  Past OCD about germs.  Now obsessive thoughts when driving, thoughts that maybe hit someone- when knows she hasn't.  compulsions of checking and rechecking.  Pt anxiety and OCD has improved since restarting medication in 2020. Pt has days where can drive ok and other days causes panic.  Pt still avoids driving if doesn't have to.    Risk Assessment: Danger to Self:  No Self-injurious Behavior: No Danger to Others: No Duty to Warn:no Physical Aggression / Violence:No  Access to Firearms a concern: No  Gang Involvement:No  Patient / guardian was educated about steps to take if suicide or homicide risk level increases between visits: n/a While future psychiatric events cannot be accurately predicted, the patient does not currently require acute inpatient psychiatric care and does not currently meet [facility] involuntary commitment criteria.  Substance Abuse History: Current substance abuse: No   cbd   Past Psychiatric History:   Previous psychological history is significant for anxiety and OCD Outpatient Providers: 2020/ 2021 couple sessions w/ Mus[facility].   History of Psych Hospitalization:  No  Psychological Testing:  none    Abuse History:  Victim of: No.,  none    Report needed: No. Victim of Neglect:No. Perpetrator of  none   Witness / Exposure to Domestic Violence: No   Protective Services Involvement: No  Witness to [employer] Violence:  No   Family History:  Family History  Problem Relation Age of Onset   Heart disease Mother        chf   Obesity Mother    Anxiety disorder Mother    Heart failure Mother    Skin cancer Father    Cancer Father        skin cancer   Heart disease  Maternal Grandmother    Heart disease Maternal Uncle   Pt reports recognizes that mom struggled w/ OCD- Mother always checking things and was OCD about locking door.  Pt reports mom suffered from deprssion when dad left and mom did take meds.    Pt grew up in [place].  13y/o parents divorced. 1/2 sister and 1/2 brother on fathers side but didn't grow up w/. Pt lived w/ mom.  Mom passed when she was 21y/o from heart attack.  Her dad received the insurance money and didn't provide any support to pt financially.  Pt moved to [place] for couple years  and been [place] area since. Pt reports she has connected a little w/ 1/2 sister as an adult.  Pt reports has connected w/ dad some as well and talk once a month.    Living situation: the patient lives with her spouse.    Sexual Orientation: Straight  Relationship Status: married to husband for 1year.  They have Lived together for 7 years.  This is both their 2nd marriage.  Pt was together w/ her ex for 6 or 7 years. Pt reports that split didn't end well as he threatened to kill self.  Name of spouse / other:Adam If a parent, number of children / ages: no kids.  2 cats and 4 dogs.    Support Systems: spouse- Quita Skye, sister in law who is younger.  Bestfriends.  [name] bestfriend through divorce.     Financial Stress:  No   Income/Employment/Disability: Employment  for 16 years w/ [employer]. She is an account services- manages accounts. Pt reports hybrid policy just started Dec 2023.  Pt reports that her whole department is remote except for her and that her employer hasn't been willing to work w/ her through transition to hybrid.  Job can be stressful- Days that nothing and than other busy.  Customer service.    Military Service: No   Educational History: Education: graduated Administrator, Civil Service: Not reported  Any cultural differences that may affect / interfere with treatment:  not applicable   Recreation/Hobbies:  reading- book club, shopping, video games, crafting things.  Anything w/ the dogs.    Stressors: job requiring hybrid schedule- in office 3 days beginning December 2023.  Changes in past year w/ wedding and building a house- moved in last month and selling old house.     Strengths: Supportive Relationships and Self Advocate  Barriers:  none   Legal History: Pending legal issue / charges: The patient has no significant history of legal issues. 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     calcium carbonate (TUMS - DOSED IN MG ELEMENTAL CALCIUM) 500 MG chewable tablet Chew 1 tablet by mouth daily as needed for indigestion or heartburn.     cetirizine (ZYRTEC) 10 MG tablet Take 1 tablet (10 mg total) by mouth daily. 30 tablet 11   hydrOXYzine (ATARAX) 10 MG tablet Take  1-2 tablets (10-20 mg total) by mouth every 8 (eight) hours as needed. 30 tablet 0   nitrofurantoin, macrocrystal-monohydrate, (MACROBID) 100 MG capsule Take 1 capsule (100 mg total) by mouth 2 (two) times daily. 10 capsule 0   Norgestimate-Ethinyl Estradiol Triphasic 0.18/0.215/0.25 MG-35 MCG tablet Take 1 tablet by mouth daily.     pantoprazole (PROTONIX) 40 MG tablet Take 1 tablet (40 mg total) by mouth daily. 30 tablet 3   rosuvastatin (CRESTOR) 5 MG tablet Take 1 tablet (5 mg total) by mouth at bedtime. 90 tablet 3   sertraline (ZOLOFT) 100 MG tablet Take 2 tablets (200 mg total) by mouth at bedtime. 180 tablet 1   No current facility-administered medications for this visit.    Allergies  Allergen Reactions   Ciprofloxacin Itching   Rocephin [Ceftriaxone] Itching   Penicillins Itching and Rash    Has patient had a PCN reaction causing immediate rash, facial/tongue/throat swelling, SOB or lightheadedness with hypotension: Unknown Has patient had a PCN reaction causing severe rash involving mucus membranes or skin necrosis: Unknown Has patient had a PCN reaction that required hospitalization Unknown Has patient had a PCN reaction occurring within the last 10 years: Yes If all of the above answers are "NO", then may proceed with Cephalosporin use.     Diagnoses:  Obsessive-compulsive disorder, unspecified type  Individualized Treatment Plan Strengths: positive relationships, self aware, self advocate, her dogs/cats, book club, enjoys video games.    Supports: her husband, her sister in law, her bestfriend- [name].     Goal/Needs for Treatment:  In order of importance to patient 1) decrease anxiety/OCD symptoms 2) increase coping skills to manage OCD and anxiety 3) --   Client Statement of Needs: every 2 weeks.     Treatment Level:outpatient counseling  Symptoms: anxiety, obsessive thoughts and compulsions  Client Treatment Preferences: "medication is great but doesn't always  work.  I want to have techniques to handle situations better, talk to help keep my brain in check. I realize this is me forever and so when things still pop up I want to deal w/ obsessive thoughts and not do the compulsions.  Break that habit of going into to  disaster mode."     Healthcare consumer's goal for treatment:  Counselor, Jan Fireman, [facility] will support the patient's ability to achieve the goals identified. Cognitive Behavioral Therapy, Assertive Communication/Conflict Resolution Training, Relaxation Training, ACT, Humanistic and other evidenced-based practices will be used to promote progress towards healthy functioning.   Healthcare consumer will: Actively participate in therapy, working towards healthy functioning.    *Justification for Continuation/Discontinuation of Goal: R=Revised, O=Ongoing, A=Achieved, D=Discontinued  Goal 1) Reduce the frequency, intensity, and duration of obsessions and/or compulsions by  per pt report and therapist observation. Baseline date 01/24/22: Progress towards goal 0; How Often - Daily Target Date Goal Was reviewed Status Code Progress towards goal/Likert rating  01/25/23                Goal 2) Identify and replace biased, fearful self-talk and beliefs and repeats the exposure to the internal and/or external OCD cues w/out participating in compulsion of checking per pt report and therapist observation. Baseline date 01/24/22: Progress towards goal 0; How Often - Daily Target Date Goal Was reviewed Status Code Progress towards goal  01/25/23                 This plan has been reviewed and created by the following participants:  This plan will be reviewed at least every 12 months. Date Behavioral Health Clinician Date Guardian/Patient   01/24/22 [facility]. [facility] 01/24/22 Verbal Consent Provided                    Plan of Care: Pt is 42y/o married female seeking counseling for OCD as referred by her PCP.  Pt has been dx w/ OCD for years and has  been on medication management for OCD.  Pt w/ PCP guidance attempted to d/c meds in 2019 w/out good success and meds restarted after worsening OCD and new obsessive thoughts.  Pt has been managing ok w/ OCD while working at home- but work requiring return to office 3 days a week and this has exacerbated OCD symptoms related to driving.  Pt is seeking counseling to assist coping and reduce OCD.  Pt to f/u in 2 weeks w/ counselor.  Pt to f/u w/ PCP for medication management.     Jan Fireman, [facility]

## 2022-02-15 ENCOUNTER — Ambulatory Visit (INDEPENDENT_AMBULATORY_CARE_PROVIDER_SITE_OTHER): Payer: BC Managed Care – PPO | Admitting: Psychology

## 2022-02-15 DIAGNOSIS — F429 Obsessive-compulsive disorder, unspecified: Secondary | ICD-10-CM

## 2022-02-15 NOTE — Progress Notes (Signed)
[facility] Counselor/Therapist Progress Note  Patient ID: Selena Flores, MRN: [id number],    Date: 02/15/2022  Time Spent: 5:03am-11:45am  Pt is seen for a virtual video visit via caregility.  Pt joins from her home, reporting privacy, and counselor from her office.     Treatment Type: Individual Therapy  Reported Symptoms: able to drive and not avoid, no turning around compulsion, awareness of need for boundaries, stress management.  Mental Status Exam: Appearance:  Well Groomed     Behavior: Appropriate  Motor: Normal  Speech/Language:  Normal Rate  Affect: Appropriate  Mood: anxious  Thought process: normal  Thought content:   WNL  Sensory/Perceptual disturbances:   WNL  Orientation: oriented to person, place, time/date, and situation  Attention: Good  Concentration: Good  Memory: WNL  Fund of knowledge:  Good  Insight:   Good  Judgment:  Good  Impulse Control: Good   Risk Assessment: Danger to Self:  No Self-injurious Behavior: No Danger to Others: No Duty to Warn:no Physical Aggression / Violence:No  Access to Firearms a concern: No  Gang Involvement:No   Subjective:  counselor assessed pt current functioning per pt report. Processed w/pt anxiety, stress and OCD symptoms.  Explored positives w/ non avoidance and not completing compulsions.  Reflected impact of stress, base anxiety level on OCD and explored ways to focus on stress management.  Discussed how to set boundaries to give time for self and home. Discussed creating new habit/routine and ways to increase maintaining.  Pt affect wnl.  Pt reported busy day at work w/coworker out.  Pt reported that she has been able to work from home and not go into office w/ Biomedical scientist.  Pt reported that she was able to drive several times last week.  Pt reported that did well one day w/ and next day increased OCD w/ when friend became stressed.  Pt reports she didn't partake in compulsion to turn  car around and check.  Pt reported awareness when her stress is high her anxiety and ocd more difficult to manage.  Pt discussed being a people pleaser and won't say no to someone even if takes away from what she wants to do and from her time for stress management.  Pt also recognized she is all or nothing type of person. She will do something 125% and then miss a day and totally stop.  Pt increased awareness of ways to set boundaries and time for self.  Pt will f/u w/ attempting in next 2 weeks.  Interventions: Cognitive Behavioral Therapy and stress management and setting boundaries  Diagnosis:Obsessive-compulsive disorder, unspecified type  Plan: pt to f/u w/ counseling in 2 weeks.  Pt to f/u a scheduled w/ PCP for medication management.   Individualized Treatment Plan Strengths: positive relationships, self aware, self advocate, her dogs/cats, book club, enjoys video games.    Supports: her husband, her sister in law, her bestfriend- [name].     Goal/Needs for Treatment:  In order of importance to patient 1) decrease anxiety/OCD symptoms 2) increase coping skills to manage OCD and anxiety 3) --   Client Statement of Needs: every 2 weeks.     Treatment Level:outpatient counseling  Symptoms: anxiety, obsessive thoughts and compulsions  Client Treatment Preferences: "medication is great but doesn't always work.  I want to have techniques to handle situations better, talk to help keep my brain in check. I realize this is me forever and so when things still pop up I want to deal  w/ obsessive thoughts and not do the compulsions.  Break that habit of going into to disaster mode."     Healthcare consumer's goal for treatment:  Counselor, Jan Fireman, [facility] will support the patient's ability to achieve the goals identified. Cognitive Behavioral Therapy, Assertive Communication/Conflict Resolution Training, Relaxation Training, ACT, Humanistic and other evidenced-based practices will be used to  promote progress towards healthy functioning.   Healthcare consumer will: Actively participate in therapy, working towards healthy functioning.    *Justification for Continuation/Discontinuation of Goal: R=Revised, O=Ongoing, A=Achieved, D=Discontinued  Goal 1) Reduce the frequency, intensity, and duration of obsessions and/or compulsions by  per pt report and therapist observation. Baseline date 01/24/22: Progress towards goal 0; How Often - Daily Target Date Goal Was reviewed Status Code Progress towards goal/Likert rating  01/25/23                Goal 2) Identify and replace biased, fearful self-talk and beliefs and repeats the exposure to the internal and/or external OCD cues w/out participating in compulsion of checking per pt report and therapist observation. Baseline date 01/24/22: Progress towards goal 0; How Often - Daily Target Date Goal Was reviewed Status Code Progress towards goal  01/25/23                 This plan has been reviewed and created by the following participants:  This plan will be reviewed at least every 12 months. Date Behavioral Health Clinician Date Guardian/Patient   01/24/22 [facility]. [facility] 01/24/22 Verbal Consent Provided                    Jan Fireman, [facility]

## 2022-02-15 NOTE — Progress Notes (Signed)
                Selena Flores, LCMHC ?

## 2022-02-20 ENCOUNTER — Telehealth: Payer: BC Managed Care – PPO | Admitting: Family Medicine

## 2022-02-20 ENCOUNTER — Telehealth: Payer: Self-pay

## 2022-02-20 DIAGNOSIS — F419 Anxiety disorder, unspecified: Secondary | ICD-10-CM | POA: Diagnosis not present

## 2022-02-20 NOTE — Telephone Encounter (Signed)
Called pt lvm regarding needing Follow up appt For 4 months to call our office.

## 2022-02-20 NOTE — Progress Notes (Signed)
MyChart Video Visit Virtual Visit via Video Note   This visit type was conducted due to national recommendations for restrictions regarding the COVID-19 Pandemic (e.g. social distancing) in an effort to limit this patient's exposure and mitigate transmission in our community. This patient is at least at moderate risk for complications without adequate follow up. This format is felt to be most appropriate for this patient at this time. Physical exam was limited by quality of the video and audio technology used for the visit. [name], CMA, was able to get the patient set up on a video visit.  Patient location: Patient's home Provider location: Office  I discussed the limitations of evaluation and management by telemedicine and the availability of in person appointments. The patient expressed understanding and agreed to proceed.  Visit Date: 02/20/2022  Today's healthcare provider: Penni Homans, MD     Subjective:    Patient ID: Selena Flores, female    DOB: 01/31/1980, 42 y.o.   MRN: [id number]  Chief Complaint  Patient presents with   Follow-up    FOLLOW UP    HPI Patient is in today for a virtual visit. She denies CP/palpitations/SOB/HA/congestion/ fever/chills/GI or GU symptoms.  Anxiety  Patient continues to take Hydroxyzine 10 mg prn and Sertraline 100 mg nightly to manage her anxiety which has been tolerable.  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   Patellofemoral  syndrome 02/17/2015   Plantar fasciitis 11/28/2017   Preventative health care 10/15/2017   GYN: Dr [facility]   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   Vitamin D deficiency 11/28/2017    Past Surgical History:  Procedure Laterality Date   CERVICAL CONE BIOPSY     TONSILLECTOMY      Family History  Problem Relation Age of Onset   Heart disease Mother        chf   Obesity Mother    Anxiety disorder Mother    Heart failure Mother    Skin cancer Father    Cancer Father        skin cancer   Heart disease Maternal Grandmother    Heart disease Maternal Uncle     Social History   Socioeconomic History   Marital status: Married    Spouse name: [name]   Number of children: 0   Years of education: Not on file   Highest education level: Not on file  Occupational History   Occupation: Therapist, art sr specialist    Comment: Customer Service   Tobacco Use   Smoking status: Never   Smokeless tobacco: Never   Tobacco comments:    smoked for 1 yr socially in college  Vaping Use   Vaping Use: Never used  Substance and Sexual Activity   Alcohol use: Yes    Comment: social-occ   Drug use: Never   Sexual activity: Yes    Birth control/protection: Pill  Other Topics Concern   Not  on file  Social History Narrative   Lives with boyfriend, has 3 dogs, works in Therapist, art with [employer]   No dietary restrictions, walking   Social Determinants of Radio broadcast assistant Strain: Not on file  Food Insecurity: Not on file  Transportation Needs: Not on file  Physical Activity: Not on file  Stress: Not on file  Social Connections: Not on file  Intimate Partner Violence: Not on file    Outpatient Medications Prior to Visit  Medication Sig Dispense Refill   aspirin-acetaminophen-caffeine (EXCEDRIN MIGRAINE) 250-250-65 MG tablet Take 1 tablet by mouth every 6 (six) hours as needed for headache.     calcium carbonate (TUMS -  DOSED IN MG ELEMENTAL CALCIUM) 500 MG chewable tablet Chew 1 tablet by mouth daily as needed for indigestion or heartburn.     cetirizine (ZYRTEC) 10 MG tablet Take 1 tablet (10 mg total) by mouth daily. 30 tablet 11   hydrOXYzine (ATARAX) 10 MG tablet Take 1-2 tablets (10-20 mg total) by mouth every 8 (eight) hours as needed. 30 tablet 0   nitrofurantoin, macrocrystal-monohydrate, (MACROBID) 100 MG capsule Take 1 capsule (100 mg total) by mouth 2 (two) times daily. 10 capsule 0   Norgestimate-Ethinyl Estradiol Triphasic 0.18/0.215/0.25 MG-35 MCG tablet Take 1 tablet by mouth daily.     pantoprazole (PROTONIX) 40 MG tablet Take 1 tablet (40 mg total) by mouth daily. 30 tablet 3   rosuvastatin (CRESTOR) 5 MG tablet Take 1 tablet (5 mg total) by mouth at bedtime. 90 tablet 3   sertraline (ZOLOFT) 100 MG tablet Take 2 tablets (200 mg total) by mouth at bedtime. 180 tablet 1   No facility-administered medications prior to visit.    Allergies  Allergen Reactions   Ciprofloxacin Itching   Rocephin [Ceftriaxone] Itching   Penicillins Itching and Rash    Has patient had a PCN reaction causing immediate rash, facial/tongue/throat swelling, SOB or lightheadedness with hypotension: Unknown Has patient had a PCN reaction causing severe rash involving mucus membranes or skin necrosis: Unknown Has patient had a PCN reaction that required hospitalization Unknown Has patient had a PCN reaction occurring within the last 10 years: Yes If all of the above answers are "NO", then may proceed with Cephalosporin use.     Review of Systems  Constitutional:  Negative for chills and fever.  HENT:  Negative for congestion.   Respiratory:  Negative for shortness of breath.   Cardiovascular:  Negative for chest pain and palpitations.  Gastrointestinal:  Negative for abdominal pain, blood in stool, constipation, diarrhea, nausea and vomiting.  Genitourinary:  Negative for dysuria, frequency, hematuria and urgency.   Skin:           Neurological:  Negative for headaches.       Objective:    Physical Exam  There were no vitals taken for this visit. Wt Readings from Last 3 Encounters:  11/06/21 287 lb (130.2 kg)  07/21/21 280 lb (127 kg)  07/04/21 279 lb 6.4 oz (126.7 kg)    Diabetic Foot Exam - Simple   No data filed    Lab Results  Component Value Date   WBC 6.9 01/16/2022   HGB 13.5 01/16/2022   HCT 39.8 01/16/2022   PLT 303.0 01/16/2022   GLUCOSE 91 01/16/2022   CHOL 192 01/16/2022   TRIG 101.0 01/16/2022   HDL 77.40 01/16/2022   LDLCALC 94 01/16/2022   ALT 11 01/16/2022   AST 15 01/16/2022   NA 137 01/16/2022  K 4.8 01/16/2022   CL 103 01/16/2022   CREATININE 0.90 01/16/2022   BUN 21 01/16/2022   CO2 27 01/16/2022   TSH 3.25 01/16/2022   INR 0.9 05/12/2008   HGBA1C 5.6 01/16/2022    Lab Results  Component Value Date   TSH 3.25 01/16/2022   Lab Results  Component Value Date   WBC 6.9 01/16/2022   HGB 13.5 01/16/2022   HCT 39.8 01/16/2022   MCV 87.5 01/16/2022   PLT 303.0 01/16/2022   Lab Results  Component Value Date   NA 137 01/16/2022   K 4.8 01/16/2022   CO2 27 01/16/2022   GLUCOSE 91 01/16/2022   BUN 21 01/16/2022   CREATININE 0.90 01/16/2022   BILITOT 0.2 01/16/2022   ALKPHOS 96 01/16/2022   AST 15 01/16/2022   ALT 11 01/16/2022   PROT 6.7 01/16/2022   ALBUMIN 3.3 (L) 01/16/2022   CALCIUM 8.4 01/16/2022   ANIONGAP 10 04/06/2015   EGFR 97 07/04/2021   GFR 80.73 01/16/2022   Lab Results  Component Value Date   CHOL 192 01/16/2022   Lab Results  Component Value Date   HDL 77.40 01/16/2022   Lab Results  Component Value Date   LDLCALC 94 01/16/2022   Lab Results  Component Value Date   TRIG 101.0 01/16/2022   Lab Results  Component Value Date   CHOLHDL 2 01/16/2022   Lab Results  Component Value Date   HGBA1C 5.6 01/16/2022      Assessment & Plan:   Problem List Items Addressed This Visit     Anxiety disorder - Primary     She notes significant improvement with Sertraline and Hydroxyzine. She does not feel she needs any further adjustment to meds and she will let us know if that changes prior to her next appt.       No orders of the defined types were placed in this encounter.  I discussed the assessment and treatment plan with the patient. The patient was provided an opportunity to ask questions and all were answered. The patient agreed with the plan and demonstrated an understanding of the instructions.   The patient was advised to call back or seek an in-person evaluation if the symptoms worsen or if the condition fails to improve as anticipated.  I provided 20 minutes of face-to-face time during this encounter.  I,Mohammed Iqbal,acting as a scribe for Penni Homans, MD.,have documented all relevant documentation on the behalf of Penni Homans, MD,as directed by  Penni Homans, MD while in the presence of Penni Homans, MD.  Penni Homans, MD [facility] [phone] (phone) [phone] (fax)  [place]

## 2022-02-25 NOTE — Assessment & Plan Note (Signed)
She notes significant improvement with Sertraline and Hydroxyzine. She does not feel she needs any further adjustment to meds and she will let us know if that changes prior to her next appt.

## 2022-03-01 ENCOUNTER — Ambulatory Visit: Payer: BC Managed Care – PPO | Admitting: Psychology

## 2022-03-01 DIAGNOSIS — F429 Obsessive-compulsive disorder, unspecified: Secondary | ICD-10-CM | POA: Diagnosis not present

## 2022-03-01 NOTE — Progress Notes (Signed)
[facility] [facility] Counselor/Therapist Progress Note  Patient ID: Selena Flores, MRN: [id number],    Date: 03/01/2022  Time Spent: 3:30pm-4:01pm  Pt is seen for a virtual video visit via caregility.  Pt joins from her home, reporting privacy, and counselor from her office.     Treatment Type: Individual Therapy  Reported Symptoms: driving w/out compulsions, steps towards stress management w/ setting boundaries for self, anxiety/stress escalation yesterday.  Mental Status Exam: Appearance:  Well Groomed     Behavior: Appropriate  Motor: Normal  Speech/Language:  Normal Rate  Affect: Appropriate  Mood: anxious  Thought process: normal  Thought content:   WNL  Sensory/Perceptual disturbances:   WNL  Orientation: oriented to person, place, time/date, and situation  Attention: Good  Concentration: Good  Memory: WNL  Fund of knowledge:  Good  Insight:   Good  Judgment:  Good  Impulse Control: Good   Risk Assessment: Danger to Self:  No Self-injurious Behavior: No Danger to Others: No Duty to Warn:no Physical Aggression / Violence:No  Access to Firearms a concern: No  Gang Involvement:No   Subjective:  Counselor assessed pt current functioning per pt report. Processed w/pt anxiety, stress and OCD symptoms.  Explored continued positive steps w/ non avoidance and not participating in compulsions.  Reflected positives of setting boundaries for self and prioritizing to reduce stress. Validated anxiety and stress response yesterday and focused pt on how coped through.  Pt affect wnl.  Pt reported busy day w/ appointments for self.  Pt reported that she is feeling good about driving and more good days w/ driving than bad and not doing compulsions.  Pt reported on things for stress management over past 2 weeks and feels good about setting some boundaries.  Pt did report emotional escalation yesterday when supervisor asked about days coming into office.  Able to communicated  and recognize there was a miscommunication. Pt discussed how initially escalated in anxiety and that was able to express her feelings to supports, take time for settling activities yesterday and reframe by morning that can cope through.  Pt reports today good day and not ruminating on.     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  Pt to f/u a scheduled w/ PCP for medication management.   Individualized Treatment Plan Strengths: positive relationships, self aware, self advocate, her dogs/cats, book club, enjoys video games.    Supports: her husband, her sister in law, her bestfriend- [name].     Goal/Needs for Treatment:  In order of importance to patient 1) decrease anxiety/OCD symptoms 2) increase coping skills to manage OCD and anxiety 3) --   Client Statement of Needs: every 2 weeks.     Treatment Level:outpatient counseling  Symptoms: anxiety, obsessive thoughts and compulsions  Client Treatment Preferences: "medication is great but doesn't always work.  I want to have techniques to handle situations better, talk to help keep my brain in check. I realize this is me forever and so when things still pop up I want to deal w/ obsessive thoughts and not do the compulsions.  Break that habit of going into to disaster mode."     Healthcare consumer's goal for treatment:  Counselor, Jan Fireman, [facility] will support the patient's ability to achieve the goals identified. Cognitive Behavioral Therapy, Assertive Communication/Conflict Resolution Training, Relaxation Training, ACT,  Humanistic and other evidenced-based practices will be used to promote progress towards healthy functioning.   Healthcare consumer will: Actively participate in therapy, working towards healthy functioning.     *Justification for Continuation/Discontinuation of Goal: R=Revised, O=Ongoing, A=Achieved, D=Discontinued  Goal 1) Reduce the frequency, intensity, and duration of obsessions and/or compulsions by  per pt report and therapist observation. Baseline date 01/24/22: Progress towards goal 0; How Often - Daily Target Date Goal Was reviewed Status Code Progress towards goal/Likert rating  01/25/23                Goal 2) Identify and replace biased, fearful self-talk and beliefs and repeats the exposure to the internal and/or external OCD cues w/out participating in compulsion of checking per pt report and therapist observation. Baseline date 01/24/22: Progress towards goal 0; How Often - Daily Target Date Goal Was reviewed Status Code Progress towards goal  01/25/23                 This plan has been reviewed and created by the following participants:  This plan will be reviewed at least every 12 months. Date Behavioral Health Clinician Date Guardian/Patient   01/24/22 [facility]. [facility] 01/24/22 Verbal Consent Provided                      Jan Fireman, [facility]

## 2022-03-07 ENCOUNTER — Ambulatory Visit: Payer: BC Managed Care – PPO | Attending: Cardiology | Admitting: Cardiology

## 2022-03-07 VITALS — BP 118/84 | HR 87 | Ht 64.0 in | Wt 286.6 lb

## 2022-03-07 DIAGNOSIS — E782 Mixed hyperlipidemia: Secondary | ICD-10-CM | POA: Diagnosis not present

## 2022-03-07 DIAGNOSIS — Z6841 Body Mass Index (BMI) 40.0 and over, adult: Secondary | ICD-10-CM

## 2022-03-07 NOTE — Progress Notes (Signed)
Cardiology Office Note:    Date:  03/09/2022   ID:  Selena Flores, DOB April 24, 1980, MRN [id number]  PCP:  Mosie Lukes, MD  Cardiologist:  Berniece Salines, DO  Electrophysiologist:  None   Referring MD: Mosie Lukes, MD   " I am doing well"   History of Present Illness:    Selena Flores is a 42 y.o. female with a hx of chronic kidney disease, GERD, hyperlipidemia here today for follow-up.  At her last visit in June 2023 show experiencing chest discomfort I sent the patient for a coronary CTA.  He offers no complaints at this time.  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   calcium carbonate (TUMS - DOSED IN MG ELEMENTAL CALCIUM) 500 MG chewable tablet Chew 1 tablet by mouth daily as needed for indigestion or heartburn.   cetirizine (ZYRTEC) 10 MG tablet Take 1 tablet (10 mg total) by mouth daily.   hydrOXYzine (ATARAX) 10 MG tablet Take 1-2  tablets (10-20 mg total) by mouth every 8 (eight) hours as needed.   Norgestimate-Ethinyl Estradiol Triphasic 0.18/0.215/0.25 MG-35 MCG tablet Take 1 tablet by mouth daily.   rosuvastatin (CRESTOR) 5 MG tablet Take 1 tablet (5 mg total) by mouth at bedtime.   sertraline (ZOLOFT) 100 MG tablet Take 2 tablets (200 mg total) by mouth at bedtime.     Allergies:   Ciprofloxacin, Rocephin [ceftriaxone], and Penicillins   Social History   Socioeconomic History   Marital status: Married    Spouse name: Selena Flores   Number of children: 0   Years of education: Not on file   Highest education level: Not on file  Occupational History   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   Tobacco Use   Smoking status: Never   Smokeless tobacco: Never   Tobacco comments:    smoked for 1 yr socially in college  Vaping Use   Vaping Use: Never used  Substance and Sexual Activity   Alcohol use: Yes    Comment: social-occ   Drug use: Never   Sexual activity: Yes    Birth control/protection: Pill  Other Topics Concern   Not on file  Social History Narrative   Lives with boyfriend, has 3 dogs, works in Therapist, art with [employer]   No dietary restrictions, walking   Social Determinants of Radio broadcast assistant  Strain: Not on file  Food Insecurity: Not on file  Transportation Needs: Not on file  Physical Activity: Not on file  Stress: Not on file  Social Connections: Not on file     Family History: The patient's family history includes Anxiety disorder in her mother; Cancer in her father; Heart disease in her maternal grandmother, maternal uncle, and mother; Heart failure in her mother; Obesity in her mother; Skin cancer in her father.  ROS:   Review of Systems  Constitution: Negative for decreased appetite, fever and weight gain.  HENT: Negative for congestion, ear discharge, hoarse voice and sore throat.   Eyes: Negative for discharge,  redness, vision loss in right eye and visual halos.  Cardiovascular: Negative for chest pain, dyspnea on exertion, leg swelling, orthopnea and palpitations.  Respiratory: Negative for cough, hemoptysis, shortness of breath and snoring.   Endocrine: Negative for heat intolerance and polyphagia.  Hematologic/Lymphatic: Negative for bleeding problem. Does not bruise/bleed easily.  Skin: Negative for flushing, nail changes, rash and suspicious lesions.  Musculoskeletal: Negative for arthritis, joint pain, muscle cramps, myalgias, neck pain and stiffness.  Gastrointestinal: Negative for abdominal pain, bowel incontinence, diarrhea and excessive appetite.  Genitourinary: Negative for decreased libido, genital sores and incomplete emptying.  Neurological: Negative for brief paralysis, focal weakness, headaches and loss of balance.  Psychiatric/Behavioral: Negative for altered mental status, depression and suicidal ideas.  Allergic/Immunologic: Negative for HIV exposure and persistent infections.    EKGs/Labs/Other Studies Reviewed:    The following studies were reviewed today:   EKG:  The ekg ordered today demonstrates   CCTA 07/27/2021  FINDINGS: Quality: Good, HR 63   Coronary calcium score: The patient's coronary artery calcium score is 0, which places the patient in the 0 percentile.   Coronary arteries: Normal coronary origins.  Right dominance.   Right Coronary Artery: Dominant. Normal vessel. Normal R-PDA and R-PLB branches.   Left Main Coronary Artery: Normal. Bifurcates into the LAD and LCx arteries.   Left Anterior Descending Coronary Artery: Smaller anterior artery which does not reach the apex and ends in a pitchfork bifurcation. No disease. 2 diagonal branches without disease.   Left Circumflex Artery: AV groove vessel - no disease. Large OM branch which bifurcates, no disease.   Aorta: Normal size, 26 mm at the mid ascending aorta (level of the PA bifurcation)  measured double oblique. No calcifications. No dissection.   Aortic Valve: Trileaflet. No calcifications.   Other findings:   Normal pulmonary vein drainage into the left atrium.   Normal left atrial appendage without a thrombus.   Normal size of the pulmonary artery.   PLAQUE ANALYSIS: Automated plaque analysis did not identify any coronary plaque.   IMPRESSION: 1. No evidence of CAD, CADRADS = 0.   2. Coronary calcium score of 0. This was 0 percentile for age and sex matched control.   3. Normal coronary origin with right dominance.   4. Consider non-coronary causes of chest pain.   Electronically Signed: By: Pixie Casino M.D. On: 07/27/2021 09:54  Recent Labs: 07/04/2021: Magnesium 2.1 01/16/2022: ALT 11; BUN 21; Creatinine, Ser 0.90; Hemoglobin 13.5; Platelets 303.0; Potassium 4.8; Sodium 137; TSH 3.25  Recent Lipid Panel    Component Value Date/Time   CHOL 192 01/16/2022 0749   CHOL 229 (H) 09/11/2018 1145   TRIG 101.0 01/16/2022 0749   HDL 77.40 01/16/2022 0749   HDL 85 09/11/2018 1145   CHOLHDL 2 01/16/2022 0749   VLDL 20.2 01/16/2022 0749  LDLCALC 94 01/16/2022 0749   LDLCALC 125 (H) 09/11/2018 1145    Physical Exam:    VS:  BP 118/84   Pulse 87   Ht 5' 4"$  (1.626 m)   Wt 130 kg   BMI 49.19 kg/m     Wt Readings from Last 3 Encounters:  03/07/22 130 kg  11/06/21 130.2 kg  07/21/21 127 kg     GEN: Well nourished, well developed in no acute distress HEENT: Normal NECK: No JVD; No carotid bruits LYMPHATICS: No lymphadenopathy CARDIAC: S1S2 noted,RRR, no murmurs, rubs, gallops RESPIRATORY:  Clear to auscultation without rales, wheezing or rhonchi  ABDOMEN: Soft, non-tender, non-distended, +bowel sounds, no guarding. EXTREMITIES: No edema, No cyanosis, no clubbing MUSCULOSKELETAL:  No deformity  SKIN: Warm and dry NEUROLOGIC:  Alert and oriented x 3, non-focal PSYCHIATRIC:  Normal affect, good insight  ASSESSMENT:    1. Mixed  hyperlipidemia   2. Morbid obesity with BMI of 45.0-49.9, adult ([place])    PLAN:    She is doing well from a CV standpoint. Will continue her current medication regimen.  The patient is in agreement with the above plan. The patient left the office in stable condition.  The patient will follow up as needed.   Medication Adjustments/Labs and Tests Ordered: Current medicines are reviewed at length with the patient today.  Concerns regarding medicines are outlined above.  No orders of the defined types were placed in this encounter.  No orders of the defined types were placed in this encounter.   Patient Instructions  Medication Instructions:  Your physician recommends that you continue on your current medications as directed. Please refer to the Current Medication list given to you today.  *If you need a refill on your cardiac medications before your next appointment, please call your pharmacy*   Lab Work: None If you have labs (blood work) drawn today and your tests are completely normal, you will receive your results only by: [place] (if you have MyChart) OR A paper copy in the mail If you have any lab test that is abnormal or we need to change your treatment, we will call you to review the results.   Testing/Procedures: None   Follow-Up: At [facility], you and your health needs are our priority.  As part of our continuing mission to provide you with exceptional heart care, we have created designated Provider Care Teams.  These Care Teams include your primary Cardiologist (physician) and Advanced Practice Providers (APPs -  Physician Assistants and Nurse Practitioners) who all work together to provide you with the care you need, when you need it.  Your next appointment:    As needed  Provider:   Berniece Salines, DO       Adopting a Healthy Lifestyle.  Know what a healthy weight is for you (roughly BMI <25) and aim to maintain this   Aim for 7+ servings of  fruits and vegetables daily   65-80+ fluid ounces of water or unsweet tea for healthy kidneys   Limit to max 1 drink of alcohol per day; avoid smoking/tobacco   Limit animal fats in diet for cholesterol and heart health - choose grass fed whenever available   Avoid highly processed foods, and foods high in saturated/trans fats   Aim for low stress - take time to unwind and care for your mental health   Aim for 150 min of moderate intensity exercise weekly for heart health, and weights twice weekly for bone health  Aim for 7-9 hours of sleep daily   When it comes to diets, agreement about the perfect plan isnt easy to find, even among the experts. Experts at the [employer] developed an idea known as the Healthy Eating Plate. Just imagine a plate divided into logical, healthy portions.   The emphasis is on diet quality:   Load up on vegetables and fruits - one-half of your plate: Aim for color and variety, and remember that potatoes dont count.   Go for whole grains - one-quarter of your plate: Whole wheat, barley, wheat berries, quinoa, oats, brown rice, and foods made with them. If you want pasta, go with whole wheat pasta.   Protein power - one-quarter of your plate: Fish, chicken, beans, and nuts are all healthy, versatile protein sources. Limit red meat.   The diet, however, does go beyond the plate, offering a few other suggestions.   Use healthy plant oils, such as olive, canola, soy, corn, sunflower and peanut. Check the labels, and avoid partially hydrogenated oil, which have unhealthy trans fats.   If youre thirsty, drink water. Coffee and tea are good in moderation, but skip sugary drinks and limit milk and dairy products to one or two daily servings.   The type of carbohydrate in the diet is more important than the amount. Some sources of carbohydrates, such as vegetables, fruits, whole grains, and beans-are healthier than others.   Finally, stay  active  Signed, Berniece Salines, DO  03/09/2022 9:52 PM     [facility]

## 2022-03-07 NOTE — Patient Instructions (Signed)
Medication Instructions:  Your physician recommends that you continue on your current medications as directed. Please refer to the Current Medication list given to you today.  *If you need a refill on your cardiac medications before your next appointment, please call your pharmacy*   Lab Work: None If you have labs (blood work) drawn today and your tests are completely normal, you will receive your results only by: [place] (if you have MyChart) OR A paper copy in the mail If you have any lab test that is abnormal or we need to change your treatment, we will call you to review the results.   Testing/Procedures: None   Follow-Up: At [facility], you and your health needs are our priority.  As part of our continuing mission to provide you with exceptional heart care, we have created designated Provider Care Teams.  These Care Teams include your primary Cardiologist (physician) and Advanced Practice Providers (APPs -  Physician Assistants and Nurse Practitioners) who all work together to provide you with the care you need, when you need it.  Your next appointment:    As needed  Provider:   Berniece Salines, DO

## 2022-03-14 ENCOUNTER — Ambulatory Visit: Payer: BC Managed Care – PPO | Admitting: Psychology

## 2022-03-26 ENCOUNTER — Ambulatory Visit: Payer: BC Managed Care – PPO | Admitting: Psychology

## 2022-03-27 ENCOUNTER — Ambulatory Visit (INDEPENDENT_AMBULATORY_CARE_PROVIDER_SITE_OTHER): Payer: BC Managed Care – PPO | Admitting: Family

## 2022-03-27 ENCOUNTER — Encounter: Payer: Self-pay | Admitting: Family

## 2022-03-27 VITALS — BP 124/82 | HR 97 | Resp 18 | Ht 64.0 in | Wt 287.0 lb

## 2022-03-27 DIAGNOSIS — J019 Acute sinusitis, unspecified: Secondary | ICD-10-CM

## 2022-03-27 MED ORDER — FLUTICASONE PROPIONATE 50 MCG/ACT NA SUSP: 2.0000 | Freq: Every day | NASAL | 6 refills | Status: DC

## 2022-03-27 MED ORDER — DOXYCYCLINE HYCLATE 100 MG PO TABS: 100.0000 mg | ORAL_TABLET | Freq: Two times a day (BID) | ORAL | 0 refills | Status: DC

## 2022-03-27 NOTE — Progress Notes (Signed)
Selena Flores is a 42 y.o. 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     calcium carbonate (TUMS - DOSED IN MG ELEMENTAL CALCIUM) 500 MG chewable tablet Chew 1 tablet by mouth daily as needed for indigestion or heartburn.     doxycycline (VIBRA-TABS) 100 MG tablet Take 1 tablet (100 mg total) by mouth 2 (two) times daily. 14 tablet 0   fluticasone (FLONASE) 50 MCG/ACT nasal spray Place 2 sprays into both nostrils daily. 16 g 6   hydrOXYzine (ATARAX) 10 MG tablet Take 1-2 tablets (10-20 mg total) by mouth every 8 (eight) hours as needed. 30 tablet 0   Norgestimate-Ethinyl Estradiol Triphasic 0.18/0.215/0.25 MG-35 MCG tablet Take 1 tablet by mouth daily.     rosuvastatin (CRESTOR) 5 MG tablet Take 1 tablet (5 mg total) by mouth at bedtime. 90 tablet 3   sertraline (ZOLOFT) 100 MG tablet Take 2 tablets (200 mg total) by mouth at bedtime. 180 tablet 1   cetirizine (ZYRTEC) 10 MG tablet Take 1 tablet (10 mg total) by mouth daily. (Patient not taking: Reported on 03/27/2022) 30 tablet 11    No current facility-administered medications for this visit.    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   Patellofemoral syndrome 02/17/2015   Plantar fasciitis 11/28/2017   Preventative health care 10/15/2017   GYN: Dr Armando Reichert [facility]   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   Vitamin D deficiency 11/28/2017    Past Surgical History:  Procedure Laterality Date   CERVICAL CONE BIOPSY     TONSILLECTOMY      Family History  Problem Relation Age of Onset   Heart disease Mother        chf   Obesity Mother    Anxiety disorder Mother    Heart failure Mother    Skin cancer Father    Cancer Father        skin cancer   Heart disease Maternal Grandmother    Heart disease Maternal Uncle     Social History  Tobacco Use   Smoking status: Never   Smokeless tobacco: Never   Tobacco comments:    smoked for 1 yr socially in college  Substance Use Topics   Alcohol use: Yes    Comment: social-occ    Subjective:   Sinus infection x 2 weeks; + thick mucus; no fever; no chest pain or shortness of breath; has tried OTC Mucinex with limited relief; not prone to recurrent sinus infection or seasonal allergies;   LMP 3 weeks ago;   Objective:  Vitals:   03/27/22 1540  BP: 124/82  Pulse: 97  Resp: 18  SpO2: 98%  Weight: 287 lb (130.2 kg)  Height: '5\' 4"'$  (1.626 m)    General: Well developed, well  nourished, in no acute distress  Skin : Warm and dry.  Head: Normocephalic and atraumatic  Eyes: Sclera and conjunctiva clear; pupils round and reactive to light; extraocular movements intact  Ears: External normal; canals clear; tympanic membranes normal  Oropharynx: Pink, supple. No suspicious lesions  Neck: Supple without thyromegaly, adenopathy  Lungs: Respirations unlabored; clear to auscultation bilaterally without wheeze, rales, rhonchi  CVS exam: normal rate and regular rhythm.  Neurologic: Alert and oriented; speech intact; face symmetrical; moves all extremities well; CNII-XII intact without focal deficit   Assessment:  1. Acute sinusitis, recurrence not specified, unspecified location     Plan:  Rx for Doxycycline and Flonase; increase fluids, rest and follow up worse, no better;   No follow-ups on file.  No orders of the defined types were placed in this encounter.   Requested Prescriptions   Signed Prescriptions Disp Refills   doxycycline (VIBRA-TABS) 100 MG tablet 14 tablet 0    Sig: Take 1 tablet (100 mg total) by mouth 2 (two) times daily.   fluticasone (FLONASE) 50 MCG/ACT nasal spray 16 g 6    Sig: Place 2 sprays into both nostrils daily.

## 2022-04-07 ENCOUNTER — Encounter: Payer: Self-pay | Admitting: Family

## 2022-04-10 ENCOUNTER — Ambulatory Visit (INDEPENDENT_AMBULATORY_CARE_PROVIDER_SITE_OTHER): Payer: BC Managed Care – PPO | Admitting: Family

## 2022-04-10 ENCOUNTER — Encounter: Payer: Self-pay | Admitting: Family

## 2022-04-10 VITALS — BP 116/68 | HR 70 | Resp 18 | Ht 64.0 in | Wt 285.4 lb

## 2022-04-10 DIAGNOSIS — J069 Acute upper respiratory infection, unspecified: Secondary | ICD-10-CM

## 2022-04-10 MED ORDER — IPRATROPIUM BROMIDE 0.03 % NA SOLN: 2.0000 | Freq: Three times a day (TID) | NASAL | 0 refills | Status: DC

## 2022-04-10 NOTE — Progress Notes (Signed)
Selena Flores is a 42 y.o. 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     calcium carbonate (TUMS - DOSED IN MG ELEMENTAL CALCIUM) 500 MG chewable tablet Chew 1 tablet by mouth daily as needed for indigestion or heartburn.     cetirizine (ZYRTEC) 10 MG tablet Take 1 tablet (10 mg total) by mouth daily. 30 tablet 11   hydrOXYzine (ATARAX) 10 MG tablet Take 1-2 tablets (10-20 mg total) by mouth every 8 (eight) hours as needed. 30 tablet 0   ipratropium (ATROVENT) 0.03 % nasal spray Place 2 sprays into both nostrils 3 (three) times daily. 30 mL 0   Norgestimate-Ethinyl Estradiol Triphasic 0.18/0.215/0.25 MG-35 MCG tablet Take 1 tablet by mouth daily.     rosuvastatin (CRESTOR) 5 MG tablet Take 1 tablet (5 mg total) by mouth at bedtime. 90 tablet 3   sertraline (ZOLOFT) 100 MG tablet Take 2 tablets (200 mg total) by mouth at bedtime. 180 tablet 1   fluticasone (FLONASE) 50 MCG/ACT nasal spray Place 2 sprays into both nostrils daily. 16 g 6   No current facility-administered  medications for this visit.    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   Patellofemoral syndrome 02/17/2015   Plantar fasciitis 11/28/2017   Preventative health care 10/15/2017   GYN: Dr Armando Reichert [facility]   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   Vitamin D deficiency 11/28/2017    Past Surgical History:  Procedure Laterality Date   CERVICAL CONE BIOPSY     TONSILLECTOMY      Family History  Problem Relation Age of Onset   Heart disease Mother        chf   Obesity Mother    Anxiety disorder Mother    Heart failure Mother    Skin cancer Father    Cancer Father        skin cancer   Heart disease Maternal Grandmother    Heart disease Maternal Uncle     Social History   Tobacco Use   Smoking status: Never  Smokeless tobacco: Never   Tobacco comments:    smoked for 1 yr socially in college  Substance Use Topics   Alcohol use: Yes    Comment: social-occ    Subjective:   Patient was seen 2 weeks ago and treated for sinus infection; believes that the infection has cleared completely; notes that cough/ congestion started back at the end of last week but is concerned that she could have pneumonia;  No fever, no chest pain, no shortness of breath; used some Alka-Seltzer Cold with limited relief;  Has started Zyrtec in past 2 days; not currently using Flonase; + runny nose;   Objective:  Vitals:    04/10/22 1310  BP: 116/68  Pulse: 70  Resp: 18  SpO2: 99%  Weight: 285 lb 6.4 oz (129.5 kg)  Height: 5\' 4"  (1.626 m)    General: Well developed, well nourished, in no acute distress  Skin : Warm and dry.  Head: Normocephalic and atraumatic  Eyes: Sclera and conjunctiva clear; pupils round and reactive to light; extraocular movements intact  Ears: External normal; canals clear; tympanic membranes normal  Oropharynx: Pink, supple. No suspicious lesions  Neck: Supple without thyromegaly, adenopathy  Lungs: Respirations unlabored; clear to auscultation bilaterally without wheeze, rales, rhonchi  CVS exam: normal rate and regular rhythm.  Neurologic: Alert and oriented; speech intact; face symmetrical; moves all extremities well; CNII-XII intact without focal deficit   Assessment:  1. Viral URI with cough     Plan:  Suspect allergy component; continue Zyrtec; try adding Ipratropium nasal spray; increase fluids, rest and follow up worse, no better.   No follow-ups on file.  No orders of the defined types were placed in this encounter.   Requested Prescriptions   Signed Prescriptions Disp Refills   ipratropium (ATROVENT) 0.03 % nasal spray 30 mL 0    Sig: Place 2 sprays into both nostrils 3 (three) times daily.

## 2022-04-23 ENCOUNTER — Encounter: Payer: BC Managed Care – PPO | Admitting: Family Medicine

## 2022-05-17 DIAGNOSIS — Z01419 Encounter for gynecological examination (general) (routine) without abnormal findings: Secondary | ICD-10-CM | POA: Diagnosis not present

## 2022-05-17 DIAGNOSIS — Z124 Encounter for screening for malignant neoplasm of cervix: Secondary | ICD-10-CM | POA: Diagnosis not present

## 2022-05-17 DIAGNOSIS — Z6838 Body mass index (BMI) 38.0-38.9, adult: Secondary | ICD-10-CM | POA: Diagnosis not present

## 2022-06-10 ENCOUNTER — Other Ambulatory Visit: Payer: Self-pay | Admitting: Cardiology

## 2022-08-16 ENCOUNTER — Encounter: Payer: BC Managed Care – PPO | Admitting: Family Medicine

## 2022-08-21 ENCOUNTER — Other Ambulatory Visit: Payer: Self-pay | Admitting: Family Medicine

## 2022-08-21 DIAGNOSIS — F418 Other specified anxiety disorders: Secondary | ICD-10-CM

## 2022-09-06 ENCOUNTER — Encounter (INDEPENDENT_AMBULATORY_CARE_PROVIDER_SITE_OTHER): Payer: Self-pay

## 2022-09-14 ENCOUNTER — Encounter: Payer: Self-pay | Admitting: Family

## 2022-09-14 ENCOUNTER — Ambulatory Visit: Payer: BC Managed Care – PPO | Admitting: Family

## 2022-09-14 VITALS — BP 114/76 | HR 79 | Resp 18 | Ht 64.0 in | Wt 289.4 lb

## 2022-09-14 DIAGNOSIS — S80822A Blister (nonthermal), left lower leg, initial encounter: Secondary | ICD-10-CM

## 2022-09-14 DIAGNOSIS — I872 Venous insufficiency (chronic) (peripheral): Secondary | ICD-10-CM

## 2022-09-14 NOTE — Progress Notes (Signed)
Selena Flores is a 42 y.o. 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     calcium carbonate (TUMS - DOSED IN MG ELEMENTAL CALCIUM) 500 MG chewable tablet Chew 1 tablet by mouth daily as needed for indigestion or heartburn.     cetirizine (ZYRTEC) 10 MG tablet Take 1 tablet (10 mg total) by mouth daily. 30 tablet 11   hydrOXYzine (ATARAX) 10 MG tablet Take 1-2 tablets (10-20 mg total) by mouth every 8 (eight) hours as needed. 30 tablet 0   Norgestimate-Ethinyl Estradiol Triphasic 0.18/0.215/0.25 MG-35 MCG tablet Take 1 tablet by mouth daily.     rosuvastatin (CRESTOR) 5 MG tablet TAKE 1 TABLET AT BEDTIME 90 tablet 0   sertraline (ZOLOFT) 100 MG tablet Take 2 tablets (200 mg total) by mouth at bedtime. 180 tablet 0   ipratropium (ATROVENT) 0.03 % nasal spray Place 2 sprays into both nostrils 3 (three) times daily. 30 mL 0   No current facility-administered medications for this visit.    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   Patellofemoral syndrome 02/17/2015   Plantar fasciitis 11/28/2017   Preventative health care 10/15/2017   GYN: Dr T[facility]   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   Vitamin D deficiency 11/28/2017    Past Surgical History:  Procedure Laterality Date   CERVICAL CONE BIOPSY     TONSILLECTOMY      Family History  Problem Relation Age of Onset   Heart disease Mother        chf   Obesity Mother    Anxiety disorder Mother    Heart failure Mother    Skin cancer Father    Cancer Father        skin cancer   Heart disease Maternal Grandmother    Heart disease Maternal Uncle     Social History   Tobacco Use   Smoking status: Never   Smokeless tobacco: Never   Tobacco comments:    smoked for 1 yr socially in college  Substance Use Topics  Alcohol use: Yes    Comment: social-occ    Subjective:   Patient was on vacation at [facility] the week of 8/15; walked almost 40 miles over the course of entire week;  By 2nd day of trip, had developed swelling in lower extremities/ blisters around lower ankles; has been diligently cleaning and covering blisters with Neo-sporin; has noticed good improvement in symptoms since she made her appointment earlier this week;    Objective:  Vitals:   09/14/22 1550  BP: 114/76  Pulse: 79  Resp: 18  SpO2: 99%  Weight: 289 lb 6.4 oz (131.3 kg)  Height: 5\' 4"  (1.626 m)    General: Well developed,  well nourished, in no acute distress  Skin : Warm and dry. Healing blisters noted bilaterally on lower extremities- right lower leg blisters are wrapped around back of right leg Head: Normocephalic and atraumatic  Lungs: Respirations unlabored;  Neurologic: Alert and oriented; speech intact; face symmetrical; moves all extremities well; CNII-XII intact without focal deficit   Assessment:  1. Acute bilateral venous stasis dermatitis   2. Blister of left lower extremity without infection, initial encounter     Plan:   Reassurance that areas healing well- no symptoms of infection; follow up as needed otherwise;  Keep planned follow up for CPE next month.   No follow-ups on file.  No orders of the defined types were placed in this encounter.   Requested Prescriptions    No prescriptions requested or ordered in this encounter

## 2022-10-19 ENCOUNTER — Encounter: Payer: BC Managed Care – PPO | Admitting: Family

## 2022-11-01 ENCOUNTER — Other Ambulatory Visit: Payer: Self-pay | Admitting: Cardiology

## 2022-11-04 ENCOUNTER — Other Ambulatory Visit: Payer: Self-pay | Admitting: Family Medicine

## 2022-11-04 DIAGNOSIS — F418 Other specified anxiety disorders: Secondary | ICD-10-CM

## 2022-12-03 DIAGNOSIS — Z0289 Encounter for other administrative examinations: Secondary | ICD-10-CM

## 2022-12-13 ENCOUNTER — Encounter: Payer: Self-pay | Admitting: Family Medicine

## 2022-12-13 ENCOUNTER — Ambulatory Visit: Payer: BC Managed Care – PPO | Admitting: Family Medicine

## 2022-12-13 VITALS — BP 134/78 | HR 87 | Ht 64.0 in | Wt 290.0 lb

## 2022-12-13 DIAGNOSIS — Z6841 Body Mass Index (BMI) 40.0 and over, adult: Secondary | ICD-10-CM

## 2022-12-13 DIAGNOSIS — E785 Hyperlipidemia, unspecified: Secondary | ICD-10-CM

## 2022-12-13 DIAGNOSIS — Z0000 Encounter for general adult medical examination without abnormal findings: Secondary | ICD-10-CM | POA: Diagnosis not present

## 2022-12-13 DIAGNOSIS — Z131 Encounter for screening for diabetes mellitus: Secondary | ICD-10-CM

## 2022-12-13 NOTE — Progress Notes (Signed)
Complete physical exam  Patient: Selena Flores   DOB: [birth year]   42 y.o. Female  MRN: [id number]  Subjective:    Chief Complaint  Patient presents with   Annual Exam    Selena Flores is a 42 y.o. female who presents today for a complete physical exam. She reports consuming a general diet. The patient does not participate in regular exercise at present. She generally feels well. She reports sleeping well. She does not have additional problems to discuss today.   Currently lives with: husband Acute concerns or interim problems since last visit: no  Vision concerns: no Dental concerns: no   ETOH use: occasional Nicotine use: no Recreational drugs/illegal substances: no    Females:  She is currently  sexually active  Contraception choices are: OCPs LMP: 12/06/22      Most recent fall risk assessment:    12/13/2022    2:56 PM  Fall Risk   Falls in the past year? 0  Number falls in past yr: 0  Injury with Fall? 0  Risk for fall due to : No Fall Risks  Follow up Falls evaluation completed     Most recent depression screenings:    12/13/2022    2:56 PM 02/20/2022   11:30 AM  PHQ 2/9 Scores  PHQ - 2 Score 0 0  PHQ- 9 Score  1            Patient Care Team: Bradd Canary, MD as PCP - General (Family Medicine) Thomasene Ripple, DO as PCP - Cardiology (Cardiology)   Outpatient Medications Prior to Visit  Medication Sig   aspirin-acetaminophen-caffeine (EXCEDRIN MIGRAINE) 250-250-65 MG tablet Take 1 tablet by mouth every 6 (six) hours as needed for headache.   calcium carbonate (TUMS - DOSED IN MG ELEMENTAL CALCIUM) 500 MG chewable tablet Chew 1 tablet by mouth daily as needed for indigestion or heartburn.   cetirizine (ZYRTEC) 10 MG tablet Take 1 tablet (10 mg total) by mouth daily.   hydrOXYzine (ATARAX) 10 MG tablet Take 1-2 tablets (10-20 mg total) by mouth every 8 (eight) hours as needed.   Norgestimate-Ethinyl Estradiol  Triphasic 0.18/0.215/0.25 MG-35 MCG tablet Take 1 tablet by mouth daily.   rosuvastatin (CRESTOR) 5 MG tablet TAKE 1 TABLET AT BEDTIME   sertraline (ZOLOFT) 100 MG tablet TAKE 2 TABLETS AT BEDTIME   No facility-administered medications prior to visit.    ROS All review of systems negative except what is listed in the HPI        Objective:     BP 134/78   Pulse 87   Ht 5\' 4"  (1.626 m)   Wt 290 lb (131.5 kg)   SpO2 99%   BMI 49.78 kg/m    Physical Exam Vitals reviewed.  Constitutional:      General: She is not in acute distress.    Appearance: Normal appearance. She is obese. She is not ill-appearing.  HENT:     Head: Normocephalic and atraumatic.     Right Ear: Tympanic membrane normal.     Left Ear: Tympanic membrane normal.     Nose: Nose normal.     Mouth/Throat:     Mouth: Mucous membranes are moist.     Pharynx: Oropharynx is clear.  Eyes:     Extraocular Movements: Extraocular movements intact.     Conjunctiva/sclera: Conjunctivae normal.     Pupils: Pupils are equal, round, and reactive to light.  Neck:     Vascular: No carotid  bruit.  Cardiovascular:     Rate and Rhythm: Normal rate and regular rhythm.     Pulses: Normal pulses.     Heart sounds: Normal heart sounds.  Pulmonary:     Effort: Pulmonary effort is normal.     Breath sounds: Normal breath sounds.  Abdominal:     General: Abdomen is flat. Bowel sounds are normal. There is no distension.     Palpations: Abdomen is soft. There is no mass.     Tenderness: There is no abdominal tenderness. There is no right CVA tenderness, left CVA tenderness, guarding or rebound.  Genitourinary:    Comments: Deferred exam Musculoskeletal:        General: Normal range of motion.     Cervical back: Normal range of motion and neck supple. No tenderness.     Right lower leg: No edema.     Left lower leg: No edema.  Lymphadenopathy:     Cervical: No cervical adenopathy.  Skin:    General: Skin is warm and  dry.     Capillary Refill: Capillary refill takes less than 2 seconds.  Neurological:     General: No focal deficit present.     Mental Status: She is alert and oriented to person, place, and time. Mental status is at baseline.  Psychiatric:        Mood and Affect: Mood normal.        Behavior: Behavior normal.        Thought Content: Thought content normal.        Judgment: Judgment normal.        No results found for any visits on 12/13/22.     Assessment & Plan:    Routine Health Maintenance and Physical Exam Discussed health promotion and safety including diet and exercise recommendations, dental health, and injury prevention. Tobacco cessation if applicable. Seat belts, sunscreen, smoke detectors, etc.    Immunization History  Administered Date(s) Administered   Fluzone Influenza virus vaccine,trivalent (IIV3), split virus 10/08/2012   Influenza,inj,Quad PF,6+ Mos 11/21/2017   Influenza-Unspecified 10/08/2012, 10/11/2015   Moderna Sars-Covid-2 Vaccination 04/12/2019, 05/10/2019, 01/17/2020   Tdap 01/19/2012, 07/21/2021    Health Maintenance  Topic Date Due   INFLUENZA VACCINE  04/22/2023 (Originally 08/23/2022)   Cervical Cancer Screening (HPV/Pap Cotest)  05/11/2026   DTaP/Tdap/Td (3 - Td or Tdap) 07/22/2031   Hepatitis C Screening  Completed   HIV Screening  Completed   HPV VACCINES  Aged Out   COVID-19 Vaccine  Discontinued        Problem List Items Addressed This Visit       Active Problems   Hyperlipidemia   Relevant Orders   Comprehensive metabolic panel   Lipid panel   Morbid obesity with BMI of 45.0-49.9, adult (HCC)   Relevant Orders   CBC with Differential/Platelet   Comprehensive metabolic panel   Lipid panel   TSH   Hemoglobin A1c   Other Visit Diagnoses     Annual physical exam    -  Primary   Relevant Orders   CBC with Differential/Platelet   Comprehensive metabolic panel   Lipid panel   TSH   Hemoglobin A1c      Return in  about 1 year (around 12/13/2023) for physical.     Clayborne Dana, NP

## 2022-12-14 LAB — COMPREHENSIVE METABOLIC PANEL
ALT: 10 U/L (ref 0–35)
AST: 16 U/L (ref 0–37)
Albumin: 3.7 g/dL (ref 3.5–5.2)
Alkaline Phosphatase: 103 U/L (ref 39–117)
BUN: 18 mg/dL (ref 6–23)
CO2: 31 meq/L (ref 19–32)
Calcium: 9.5 mg/dL (ref 8.4–10.5)
Chloride: 101 meq/L (ref 96–112)
Creatinine, Ser: 0.91 mg/dL (ref 0.40–1.20)
GFR: 79.16 mL/min (ref >60.00)
Glucose, Bld: 82 mg/dL (ref 70–99)
Potassium: 5 meq/L (ref 3.5–5.1)
Sodium: 139 meq/L (ref 135–145)
Total Bilirubin: 0.2 mg/dL (ref 0.2–1.2)
Total Protein: 7.3 g/dL (ref 6.0–8.3)

## 2022-12-14 LAB — CBC WITH DIFFERENTIAL/PLATELET
Basophils Absolute: 0.1 10*3/uL (ref 0.0–0.1)
Basophils Relative: 1 % (ref 0.0–3.0)
Eosinophils Absolute: 0.1 10*3/uL (ref 0.0–0.7)
Eosinophils Relative: 0.8 % (ref 0.0–5.0)
HCT: 43.5 % (ref 36.0–46.0)
Hemoglobin: 14.2 g/dL (ref 12.0–15.0)
Lymphocytes Relative: 36.1 % (ref 12.0–46.0)
Lymphs Abs: 2.6 10*3/uL (ref 0.7–4.0)
MCHC: 32.7 g/dL (ref 30.0–36.0)
MCV: 90.3 fL (ref 78.0–100.0)
Monocytes Absolute: 0.6 10*3/uL (ref 0.1–1.0)
Monocytes Relative: 8 % (ref 3.0–12.0)
Neutro Abs: 3.9 10*3/uL (ref 1.4–7.7)
Neutrophils Relative %: 54.1 % (ref 43.0–77.0)
Platelets: 311 10*3/uL (ref 150.0–400.0)
RBC: 4.81 Mil/uL (ref 3.87–5.11)
RDW: 14.2 % (ref 11.5–15.5)
WBC: 7.2 10*3/uL (ref 4.0–10.5)

## 2022-12-14 LAB — LIPID PANEL
Cholesterol: 227 mg/dL — ABNORMAL HIGH (ref 0–200)
HDL: 82.8 mg/dL (ref >39.00)
LDL Cholesterol: 126 mg/dL — ABNORMAL HIGH (ref 0–99)
NonHDL: 144
Total CHOL/HDL Ratio: 3
Triglycerides: 89 mg/dL (ref 0.0–149.0)
VLDL: 17.8 mg/dL (ref 0.0–40.0)

## 2022-12-14 LAB — TSH: TSH: 2.78 u[IU]/mL (ref 0.35–5.50)

## 2022-12-14 LAB — HEMOGLOBIN A1C: Hgb A1c MFr Bld: 5.5 % (ref 4.6–6.5)

## 2023-01-01 ENCOUNTER — Encounter (INDEPENDENT_AMBULATORY_CARE_PROVIDER_SITE_OTHER): Payer: Self-pay | Admitting: Family Medicine

## 2023-01-01 ENCOUNTER — Ambulatory Visit (INDEPENDENT_AMBULATORY_CARE_PROVIDER_SITE_OTHER): Payer: BC Managed Care – PPO | Admitting: Family Medicine

## 2023-01-01 VITALS — BP 124/83 | HR 72 | Temp 98.6°F | Ht 63.5 in | Wt 287.0 lb

## 2023-01-01 DIAGNOSIS — Z6841 Body Mass Index (BMI) 40.0 and over, adult: Secondary | ICD-10-CM

## 2023-01-01 DIAGNOSIS — R5383 Other fatigue: Secondary | ICD-10-CM | POA: Diagnosis not present

## 2023-01-01 DIAGNOSIS — Z1331 Encounter for screening for depression: Secondary | ICD-10-CM

## 2023-01-01 DIAGNOSIS — E785 Hyperlipidemia, unspecified: Secondary | ICD-10-CM | POA: Diagnosis not present

## 2023-01-01 DIAGNOSIS — F39 Unspecified mood [affective] disorder: Secondary | ICD-10-CM

## 2023-01-01 DIAGNOSIS — E559 Vitamin D deficiency, unspecified: Secondary | ICD-10-CM

## 2023-01-01 DIAGNOSIS — R0602 Shortness of breath: Secondary | ICD-10-CM | POA: Diagnosis not present

## 2023-01-01 NOTE — Progress Notes (Incomplete)
Carlye Grippe, D.O.  ABFM, ABOM Specializing in Clinical Bariatric Medicine Office located at: [address]  [place], [place]  [address]   Bariatric Medicine Visit  Dear Bradd Canary, MD   Thank you for referring Selena Flores to our clinic today for evaluation.  We performed a consultation to discuss her options for treatment and educate the patient on her disease state.  The following note includes my evaluation and treatment recommendations.   Please do not hesitate to reach out to me directly if you have any further concerns.   Assessment and Plan:   FOR THE DISEASE OF OBESITY: BMI 50.0-59.9, adult (HCC) Morbid obesity (HCC)-starting BMI 50.04 Assessment & Plan:  Recommended Dietary Goals Selena Flores is currently in the action stage of change. As such, her goal is to continue weight management plan.  She has agreed to: follow the Category 2 plan   Behavioral Intervention We discussed the following Behavioral Modification Strategies today: begin to work on maintaining a reduced calorie state, getting the recommended amount of protein, incorporating whole foods, making healthy choices, staying well hydrated and practicing mindfulness when eating.  Additional resources provided today: handout on CAT 2 meal plan  Evidence-based interventions for health behavior change were utilized today including the discussion of self monitoring techniques, problem-solving barriers and SMART goal setting techniques.   Regarding patient's less desirable eating habits and patterns, we employed the technique of small changes.   Pt will specifically work on: n/a   Recommended Physical Activity Goals Selena Flores has been advised to work up to 150 minutes of moderate intensity aerobic activity a week and strengthening exercises 2-3 times per week for cardiovascular health, weight loss maintenance and preservation of muscle mass.   She has agreed to :Continue current level of  physical activity    Pharmacotherapy We both agreed to : begin with nutritional and behavioral strategies   FOR ASSOCIATED CONDITIONS ADDRESSED TODAY:  Fatigue Assessment & Plan: Selena Flores does feel that her weight is causing her energy to be lower than it should be. Fatigue may be related to obesity, depression or many other causes. she does not appear to have any red flag symptoms and this appears to most likely be related to her current lifestyle habits and dietary intake.  Labs will be ordered and reviewed with her at their next office visit in two weeks.  Epworth sleepiness scale score appears to be within normal limits.  Her ESS score is 8.  Selena Flores admits to  occasional daytime somnolence and  sometimes  wakes up still tired.  Selena Flores generally gets 7 or 8 hours of sleep per night, and states that she has generally restful sleep. Snoring is sometimes present. Apneic episodes are not present.   ECG: Performed and reviewed/ interpreted independently.  Normal sinus rhythm, rate 75 bpm; reassuring without any acute abnormalities, will continue to monitor for symptoms   Orders: -     Folate -     Insulin, random -     T4, free -     Vitamin B12 -     EKG 12-Lead   Shortness of breath on exertion Assessment & Plan: Selena Flores does feel that she gets out of breath more easily than she used to when she exercises and seems to be worsening over time with weight gain.  This has gotten worse recently. Selena Flores denies shortness of breath at rest or orthopnea. Selena Flores's shortness of breath appears to be obesity related and exercise induced, as they  do not appear to have any "red flag" symptoms/ concerns today.  Also, this condition appears to be related to a state of poor cardiovascular conditioning  Obtain labs today and will be reviewed with her at their next office visit in two weeks.  Indirect Calorimeter completed today to help guide our dietary regimen. It shows a VO2 of 259 and a REE  of 1786.  Her calculated basal metabolic rate is 4098 thus her measured basal metabolic rate is worse than expected.  Patient agreed to work on weight loss at this time.  As Selena Flores progresses through our weight loss program, we will gradually increase exercise as tolerated to treat her current condition.   If Selena Flores follows our recommendations and loses 5-10% of their weight without improvement of her shortness of breath or if at any time, symptoms become more concerning, they agree to urgently follow up with their PCP/ specialist for further consideration/ evaluation.   Selena Flores verbalizes agreement with this plan.    Depression screen Assessment & Plan: Her Food and Mood (modified PHQ-9) score was 5. No concerns in this regard.    Mood disorder (HCC) - emotional eating Assessment & Plan: Current medications: Zoloft 100 mg two tablets at bedtime and Atarax prn. Pt with hx of anxiety and depression. She feels that her mood is well controlled. Had a few sessions with counselor in the past; she is not currently seeing one. Mostly eats when bored, she feels that this is improving. Also, sometimes eats when stressed, sad, as a reward, and to help comfort herself.   Discussed the potential benefits of seeing a counselor on a regular basis, even when one does not feel that they have a pressing issue at the moment. Pt also informed about our bariatric psychologist. She declines referral to Dr.Barker today. Reminded patient of the importance of following their prudent nutrition plan and how food can affect mood as well to support emotional wellbeing. Continue all meds.    Hyperlipidemia, unspecified hyperlipidemia type Assessment & Plan: Most recent Lipid panel: Lab Results  Component Value Date   CHOL 227 (H) 12/13/2022   HDL 82.80 12/13/2022   LDLCALC 126 (H) 12/13/2022   TRIG 89.0 12/13/2022   CHOLHDL 3 12/13/2022   Condition managed by cardiologist Dr.Tobb. Pt endorses having a pertinent  family hx of elevated cholesterol. She is on rosuvastatin 5 mg once daily. Her LDL is elevated at 126. She will continue with statin therapy and begin  our treatment plan of a heart-heathy, low cholesterol meal plan.    Vitamin D deficiency Assessment & Plan: Most recent Vitamin D:  Lab Results  Component Value Date   VD25OH 31.37 01/16/2022   VD25OH 34.37 04/18/2021   VD25OH 24.04 (L) 03/10/2020   Pt endorses taking OTC Vitamin D 2,000 international units once daily. She has been on this since 2020 or so. She will continue with current supplementation regiment and begin weight loss efforts.   Orders:  -     VITAMIN D 25 Hydroxy (Vit-D Deficiency, Fractures)   FOLLOW UP:   Follow up in 2 weeks. She was informed of the importance of frequent follow up visits to maximize her success with intensive lifestyle modifications for her multiple health conditions.  Selena Flores is aware that we will review all of her lab results at our next visit.  She is aware that if anything is critical/ life threatening with the results, we will be contacting her via MyChart prior to the office visit  to discuss management.    Chief Complaint:   OBESITY Selena Flores (MR# [id number]) is a pleasant 42 y.o. female who presents for evaluation and treatment of obesity and related comorbidities. Current BMI is Body mass index is 50.04 kg/m. Selena Flores has been struggling with her weight for many years and has been unsuccessful in either losing weight, maintaining weight loss, or reaching her healthy weight goal.  Selena Flores is currently in the action stage of change and ready to dedicate time achieving and maintaining a healthier weight. Selena Flores is interested in becoming our patient and working on intensive lifestyle modifications including (but not limited to) diet and exercise for weight loss.  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 Patient is married to Selena Flores  and does not have children. She lives with her husband.  She was previously a pt at this program - saw Dr.Beasley for about a year, LOV 09/11/2018.   Desires to be 175-185 lbs in no specific time frame.   Has calorie counted (1200 cal) in the past, lost 100 lbs in 2013 (130 lbs at lowest) - she states "it worked but it's hard not to get too obsessive about it" .  Eats out 3-5 times a week.   Husband does grocery shopping.   Crave sweets and salty foods in the afternoon (after lunch).   Dislikes fish.   Snacks on crackers, cereal, some sweets, yogurt, ice-cream, chips.  Drinks coffee with creamer and sugar, sweet tea with sugar, and has wine/seltzers 1-2 times a month.   Worst food habit: "snacking and binging if I don't eat enough"  Recently had the following labs for yearly physical with PCP on 12/13/22: A1c, TSH, FLP, CMP, and CBC.  Subjective:   This is the patient's first visit at [facility].  The patient's NEW PATIENT PACKET that they filled out prior to today's office visit was reviewed at length and information from that paperwork was included within the following office visit note.    Included in the packet: current and past health history, medications, allergies, ROS, gynecologic history (women only), surgical history, family history, social history, weight history, weight loss surgery history (for those that have had weight loss surgery), nutritional evaluation, mood and food questionnaire along with a depression screening (PHQ9) on all patients, an Epworth questionnaire, sleep habits questionnaire, patient life and health improvement goals questionnaire. These will all be scanned into the patient's chart under the "media" tab.   Review of Systems: Please refer to new patient packet scanned into media. Pertinent positives were addressed with patient today.  Reviewed by clinician on day of visit:  allergies, medications, problem list, medical history, surgical history, family history, social history, and previous encounter notes.  During the visit, I independently reviewed the patient's EKG, bioimpedance scale results, and indirect calorimeter results. I used this information to tailor a meal plan for the patient that will help Selena Flores to lose weight and will improve her obesity-related conditions going forward.  I performed a medically necessary appropriate examination and/or evaluation. I discussed the assessment and treatment plan with the patient. The patient was provided an opportunity to ask questions and all were answered. The patient agreed with the plan and demonstrated an understanding of the instructions. Labs were ordered today (unless patient declined them) and will be reviewed with the patient at our next visit unless more critical results need to be addressed  immediately. Clinical information was updated and documented in the EMR.    Objective:   PHYSICAL EXAM: Blood pressure 124/83, pulse 72, temperature 98.6 F (37 C), height 5' 3.5" (1.613 m), weight 287 lb (130.2 kg), SpO2 99%. Body mass index is 50.04 kg/m.  General: Well Developed, well nourished, and in no acute distress.  HEENT: Normocephalic, atraumatic; EOMI, sclerae are anicteric. Skin: Warm and dry, good turgor Chest:  Normal excursion, shape, no gross ABN Respiratory: No conversational dyspnea; speaking in full sentences NeuroM-Sk:  Normal gross ROM * 4 extremities  Psych: A and O *3, insight adequate, mood- full   Anthropometric Measurements Height: 5' 3.5" (1.613 m) Weight: 287 lb (130.2 kg) BMI (Calculated): 50.04 Weight at Last Visit: na Weight Lost Since Last Visit: na Weight Gained Since Last Visit: na Starting Weight: 287lb Total Weight Loss (lbs): 0 lb (0 kg) Peak Weight: 292lb Waist Measurement : 47 inches   Body Composition  Body Fat %: 53.8 % Fat Mass (lbs): 154.8  lbs Muscle Mass (lbs): 126.2 lbs Total Body Water (lbs): 96 lbs Visceral Fat Rating : 18   Other Clinical Data RMR: 1786 Fasting: yes Labs: yes Today's Visit #: 1 Starting Date: 01/01/23 Comments: first visit    DIAGNOSTIC DATA REVIEWED:  BMET    Component Value Date/Time   NA 139 12/13/2022 1541   NA 138 07/04/2021 0907   K 5.0 12/13/2022 1541   CL 101 12/13/2022 1541   CO2 31 12/13/2022 1541   GLUCOSE 82 12/13/2022 1541   BUN 18 12/13/2022 1541   BUN 19 07/04/2021 0907   CREATININE 0.91 12/13/2022 1541   CALCIUM 9.5 12/13/2022 1541   GFRNONAA 97 09/11/2018 1145   GFRAA 112 09/11/2018 1145   Lab Results  Component Value Date   HGBA1C 5.5 12/13/2022   HGBA1C 5.4 10/11/2017   Lab Results  Component Value Date   INSULIN 7.3 09/11/2018   INSULIN 10.3 10/11/2017   Lab Results  Component Value Date   TSH 2.78 12/13/2022   CBC    Component Value Date/Time   WBC 7.2 12/13/2022 1541   RBC 4.81 12/13/2022 1541   HGB 14.2 12/13/2022 1541   HGB 14.1 10/11/2017 0849   HCT 43.5 12/13/2022 1541   HCT 42.4 10/11/2017 0849   PLT 311.0 12/13/2022 1541   MCV 90.3 12/13/2022 1541   MCV 87 10/11/2017 0849   MCH 29.1 10/11/2017 0849   MCH 29.4 04/06/2015 1136   MCHC 32.7 12/13/2022 1541   RDW 14.2 12/13/2022 1541   RDW 13.1 10/11/2017 0849   Iron Studies No results found for: "IRON", "TIBC", "FERRITIN", "IRONPCTSAT" Lipid Panel     Component Value Date/Time   CHOL 227 (H) 12/13/2022 1541   CHOL 229 (H) 09/11/2018 1145   TRIG 89.0 12/13/2022 1541   HDL 82.80 12/13/2022 1541   HDL 85 09/11/2018 1145   CHOLHDL 3 12/13/2022 1541   VLDL 17.8 12/13/2022 1541   LDLCALC 126 (H) 12/13/2022 1541   LDLCALC 125 (H) 09/11/2018 1145   Hepatic Function Panel     Component Value Date/Time   PROT 7.3 12/13/2022 1541   PROT 6.9 09/11/2018 1145   ALBUMIN 3.7 12/13/2022 1541   ALBUMIN 3.8 09/11/2018 1145   AST 16 12/13/2022 1541   ALT 10 12/13/2022 1541   ALKPHOS 103  12/13/2022 1541   BILITOT 0.2 12/13/2022 1541   BILITOT 0.2 09/11/2018 1145      Component Value Date/Time   TSH 2.78 12/13/2022 1541  Nutritional Lab Results  Component Value Date   VD25OH 31.37 01/16/2022   VD25OH 34.37 04/18/2021   VD25OH 24.04 (L) 03/10/2020    Attestation Statements:   I, Special Puri, acting as a Stage manager for Thomasene Lot, DO., have compiled all relevant documentation for today's office visit on behalf of Thomasene Lot, DO, while in the presence of [employer], DO.  Reviewed by clinician on day of visit: allergies, medications, problem list, medical history, surgical history, family history, social history, and previous encounter notes pertinent to patient's obesity diagnosis. I have spent 50  minutes in the care of the patient today including: preparing to see patient (e.g. review and interpretation of tests, old notes ), obtaining and/or reviewing separately obtained history, performing a medically appropriate examination or evaluation, counseling and educating the patient, ordering medications, test or procedures, documenting clinical information in the electronic or other health care record, and independently interpreting results and communicating results to the patient, family, or caregiver   I have reviewed the above documentation for accuracy and completeness, and I agree with the above. Carlye Grippe, D.O.  The 21st Century Cures Act was signed into law in 2016 which includes the topic of electronic health records.  This provides immediate access to information in MyChart.  This includes consultation notes, operative notes, office notes, lab results and pathology reports.  If you have any questions about what you read please let us know at your next visit so we can discuss your concerns and take corrective action if need be.  We are right here with you.

## 2023-01-02 LAB — FOLATE: Folate: 6.3 ng/mL (ref >3.0)

## 2023-01-02 LAB — INSULIN, RANDOM: INSULIN: 10.5 u[IU]/mL (ref 2.6–24.9)

## 2023-01-02 LAB — VITAMIN D 25 HYDROXY (VIT D DEFICIENCY, FRACTURES): Vit D, 25-Hydroxy: 34.8 ng/mL (ref 30.0–100.0)

## 2023-01-02 LAB — T4, FREE: Free T4: 0.99 ng/dL (ref 0.82–1.77)

## 2023-01-02 LAB — VITAMIN B12: Vitamin B-12: 300 pg/mL (ref 232–1245)

## 2023-01-10 ENCOUNTER — Ambulatory Visit (INDEPENDENT_AMBULATORY_CARE_PROVIDER_SITE_OTHER): Payer: Self-pay | Admitting: Family Medicine

## 2023-01-22 ENCOUNTER — Ambulatory Visit
Admission: RE | Admit: 2023-01-22 | Discharge: 2023-01-22 | Disposition: A | Discharge status: Home / Self Care | Payer: BC Managed Care – PPO | Source: Ambulatory Visit | Attending: Family Medicine | Admitting: Family Medicine

## 2023-01-22 ENCOUNTER — Telehealth: Payer: BC Managed Care – PPO

## 2023-01-22 VITALS — Ht 64.0 in | Wt 285.0 lb

## 2023-01-22 DIAGNOSIS — J111 Influenza due to unidentified influenza virus with other respiratory manifestations: Secondary | ICD-10-CM | POA: Diagnosis not present

## 2023-01-22 LAB — POCT INFLUENZA A/B
Influenza A, POC: NEGATIVE
Influenza B, POC: NEGATIVE

## 2023-01-22 LAB — POC SARS CORONAVIRUS 2 AG -  ED: SARS Coronavirus 2 Ag: NEGATIVE

## 2023-01-22 MED ORDER — PROMETHAZINE-DM 6.25-15 MG/5ML PO SYRP: 5.0000 mL | ORAL_SOLUTION | Freq: Four times a day (QID) | ORAL | 0 refills | Status: DC | PRN

## 2023-01-22 NOTE — ED Provider Notes (Signed)
 Selena Flores    CSN: [id number] Arrival date & time: 01/22/23  1721      History   Chief Complaint Chief Complaint  Patient presents with   Fever    Having severe flu symptoms for 2 days. Cough fever chills, aches coughing up green phlegm - Entered by patient    HPI Selena Flores is a 42 y.o. female.   Patient is here for flulike symptoms.  She has had fever and chills body aches headaches and cough for the last 2 days.  She is coughing up green sputum.  She has bad coughing spells that make her chest hurt.  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   Patellofemoral syndrome 02/17/2015   Plantar fasciitis 11/28/2017   Preventative health Flores 10/15/2017   GYN: Dr [facility]   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   Vitamin D  deficiency 11/28/2017    Patient Active Problem List   Diagnosis Date Noted   Frequent urination 11/06/2021   Liver mass 06/07/2021   Atypical chest pain 06/02/2021   Allergies 04/19/2021   Seborrheic keratoses 03/13/2020   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   Intermittent palpitations 02/25/2018   Vitamin D  deficiency 11/28/2017   Plantar fasciitis 11/28/2017   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   Preventative health Flores 10/15/2017   Hyperlipidemia    Anxiety disorder 02/17/2015   Esophageal reflux 02/17/2015   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 Authorizing Provider  aspirin-acetaminophen -caffeine (EXCEDRIN MIGRAINE) 250-250-65 MG tablet Take 1 tablet by mouth every 6 (six) hours as needed for headache.   Yes [provider]  calcium  carbonate (TUMS - DOSED IN MG ELEMENTAL CALCIUM ) 500 MG chewable tablet Chew 1 tablet by mouth daily as needed for indigestion or heartburn.   Yes [provider]  cetirizine  (ZYRTEC ) 10 MG tablet Take 1 tablet (10 mg total) by mouth daily. 04/18/21  Yes Domenica Harlene LABOR, MD  Cholecalciferol (VITAMIN D3) 50 MCG (2000 UT) TABS Take by mouth.   Yes [provider]  hydrOXYzine  (ATARAX ) 10 MG tablet Take  1-2 tablets (10-20 mg total) by mouth every 8 (eight) hours as needed. 01/02/22  Yes Domenica Harlene LABOR, MD  Norgestimate-Ethinyl Estradiol Triphasic 0.18/0.215/0.25 MG-35 MCG tablet Take 1 tablet by mouth daily.   Yes [provider]  promethazine -dextromethorphan (PROMETHAZINE -DM) 6.25-15 MG/5ML syrup Take 5 mLs by mouth 4 (four) times daily as needed for cough. 01/22/23  Yes Maranda Jamee Jacob, MD  rosuvastatin  (CRESTOR ) 5 MG tablet TAKE 1 TABLET AT BEDTIME 11/02/22  Yes Tobb, Kardie, DO  sertraline  (ZOLOFT ) 100 MG tablet TAKE 2 TABLETS AT BEDTIME 11/05/22  Yes Domenica Harlene LABOR, MD    Family History Family History  Problem Relation Age of Onset   Heart disease Mother        chf   Obesity Mother    Anxiety disorder Mother    Heart failure Mother    Depression  Mother    Skin cancer Father    Cancer Father        skin cancer   Heart disease Maternal Uncle    Heart disease Maternal Grandmother     Social History Social History   Tobacco Use   Smoking status: Never   Smokeless tobacco: Never   Tobacco comments:    smoked for 1 yr socially in college  Vaping Use   Vaping status: Never Used  Substance Use Topics   Alcohol use: Yes    Comment: social-occ   Drug use: Never     Allergies   Ciprofloxacin, Rocephin [ceftriaxone], and Penicillins   Review of Systems Review of Systems See HPI  Physical Exam Triage Vital Signs ED Triage Vitals  Encounter Vitals Group     BP 01/22/23 1828 (P) 121/83     Systolic BP Percentile --      Diastolic BP Percentile --      Pulse Rate 01/22/23 1828 (!) (P) 122     Resp 01/22/23 1828 (P) 17     Temp 01/22/23 1828 (P) 99.9 F (37.7 C)     Temp Source 01/22/23 1828 (P) Oral     SpO2 --      Weight 01/22/23 1826 285 lb (129.3 kg)     Height 01/22/23 1826 5' 4 (1.626 m)     Head Circumference --      Peak Flow --      Pain Score 01/22/23 1826 6     Pain Loc --      Pain Education --      Exclude from Growth Chart --    No data found.  Updated Vital Signs BP (P) 121/83 (BP Location: Left Arm)   Pulse (!) (P) 122   Temp (P) 99.9 F (37.7 C) (Oral)   Resp (P) 17   Ht 5' 4 (1.626 m)   Wt 129.3 kg   LMP 01/08/2023   BMI 48.92 kg/m      Physical Exam Constitutional:      General: She is not in acute distress.    Appearance: She is well-developed. She is obese. She is ill-appearing.  HENT:     Head: Normocephalic and atraumatic.     Right Ear: Tympanic membrane normal.     Left Ear: Tympanic membrane normal.     Nose: Congestion present.     Mouth/Throat:     Mouth: Mucous membranes are moist.     Pharynx: No posterior oropharyngeal erythema.  Eyes:     Conjunctiva/sclera: Conjunctivae normal.     Pupils: Pupils are equal, round, and reactive to light.  Cardiovascular:      Rate and Rhythm: Normal rate and regular rhythm.     Heart sounds: Normal heart sounds.  Pulmonary:     Effort: Pulmonary effort is normal. No respiratory distress.     Breath sounds: Normal breath sounds.  Abdominal:     General: There is no distension.     Palpations: Abdomen is soft.  Musculoskeletal:        General: Normal range of motion.     Cervical back: Normal range of motion and neck supple.  Skin:    General: Skin is warm and dry.  Neurological:     Mental Status: She is alert.      UC Treatments / Results  Labs (all labs ordered are listed, but only abnormal results are displayed) Labs Reviewed  POCT INFLUENZA A/B - Normal  POC SARS CORONAVIRUS 2 AG -  ED - Normal    EKG   Radiology No results found.  Procedures Procedures (including critical Flores time)  Medications Ordered in UC Medications - No data to display  Initial Impression / Assessment and Plan / UC Course  I have reviewed the triage vital signs and the nursing notes.  Pertinent labs & imaging results that were available during my Flores of the patient were reviewed by me and considered in my medical decision making (see chart for details).     Flu and COVID test are negative.  Patient has flulike symptoms.  Discussed home treatment Final Clinical Impressions(s) / UC Diagnoses   Final diagnoses:  Influenza-like illness     Discharge Instructions      May alternate Tylenol  and ibuprofen to reduce pain and fever Take Phenergan  DM as needed for cough.  This may cause drowsiness.  It will help you rest Try to drink more fluids Call your PCP or here if not improving by the end of the week     ED Prescriptions     Medication Sig Dispense Auth. Provider   promethazine -dextromethorphan (PROMETHAZINE -DM) 6.25-15 MG/5ML syrup Take 5 mLs by mouth 4 (four) times daily as needed for cough. 118 mL Maranda Jamee Jacob, MD      PDMP not reviewed this encounter.   Maranda Jamee Jacob,  MD 01/22/23 [phone]

## 2023-01-22 NOTE — Discharge Instructions (Signed)
May alternate Tylenol and ibuprofen to reduce pain and fever Take Phenergan DM as needed for cough.  This may cause drowsiness.  It will help you rest Try to drink more fluids Call your PCP or here if not improving by the end of the week

## 2023-01-22 NOTE — ED Triage Notes (Signed)
X2 days  Pt states that she has a fever, cough, chest congestion, chills and body aches.

## 2023-01-24 ENCOUNTER — Ambulatory Visit: Payer: BC Managed Care – PPO | Admitting: Family

## 2023-01-24 ENCOUNTER — Other Ambulatory Visit: Payer: Self-pay | Admitting: Family

## 2023-01-24 ENCOUNTER — Ambulatory Visit (INDEPENDENT_AMBULATORY_CARE_PROVIDER_SITE_OTHER): Payer: Self-pay | Admitting: Family Medicine

## 2023-01-24 VITALS — BP 138/84 | HR 114 | Temp 98.5°F | Ht 64.0 in | Wt 279.2 lb

## 2023-01-24 DIAGNOSIS — J209 Acute bronchitis, unspecified: Secondary | ICD-10-CM | POA: Diagnosis not present

## 2023-01-24 MED ORDER — AZITHROMYCIN 250 MG PO TABS: ORAL_TABLET | ORAL | 0 refills | Status: DC

## 2023-01-24 MED ORDER — PREDNISONE 20 MG PO TABS: 20.0000 mg | ORAL_TABLET | Freq: Every day | ORAL | 0 refills | Status: DC

## 2023-01-24 NOTE — Progress Notes (Signed)
 Selena Flores is a 43 y.o. 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     azithromycin  (ZITHROMAX ) 250 MG tablet Take 2 tab po the first day then take 1 tablet po daily for 4 days 6 tablet 0   calcium  carbonate (TUMS - DOSED IN MG ELEMENTAL CALCIUM ) 500 MG chewable tablet Chew 1 tablet by mouth daily as needed for indigestion or heartburn.     cetirizine  (ZYRTEC ) 10 MG tablet Take 1 tablet (10 mg total) by mouth daily. 30 tablet 11   Cholecalciferol (VITAMIN D3) 50 MCG (2000 UT) TABS Take by mouth.     hydrOXYzine  (ATARAX ) 10 MG tablet Take 1-2 tablets (10-20 mg total) by mouth every 8 (eight) hours as needed. 30 tablet 0   Norgestimate-Ethinyl Estradiol Triphasic 0.18/0.215/0.25 MG-35 MCG tablet Take 1 tablet by mouth daily.     predniSONE  (DELTASONE ) 20 MG tablet Take 1 tablet (20 mg total) by mouth daily with breakfast. 5 tablet 0   promethazine -dextromethorphan (PROMETHAZINE -DM) 6.25-15 MG/5ML syrup Take 5 mLs by mouth 4 (four) times daily as needed for cough. 118 mL 0    rosuvastatin  (CRESTOR ) 5 MG tablet TAKE 1 TABLET AT BEDTIME 90 tablet 2   sertraline  (ZOLOFT ) 100 MG tablet TAKE 2 TABLETS AT BEDTIME 180 tablet 0   No current facility-administered medications for this visit.    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   Patellofemoral syndrome 02/17/2015   Plantar fasciitis 11/28/2017   Preventative health care 10/15/2017   GYN: Dr [facility]   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   Vitamin D  deficiency 11/28/2017    Past Surgical History:  Procedure Laterality Date   CERVICAL CONE BIOPSY     TONSILLECTOMY      Family History  Problem Relation Age of Onset   Heart disease Mother        chf   Obesity Mother    Anxiety disorder Mother    Heart failure Mother    Depression Mother  Skin cancer Father    Cancer Father        skin cancer   Heart disease Maternal Uncle    Heart disease Maternal Grandmother     Social History   Tobacco Use   Smoking status: Never   Smokeless tobacco: Never   Tobacco comments:    smoked for 1 yr socially in college  Substance Use Topics   Alcohol use: Yes    Comment: social-occ    Subjective:   Flu- like symptoms started earlier this week- was seen at [facility] on Tuesday and had negative flu/ COVID tests; was given Rx for Promethazine  DM on Tuesday but unable to get filled until today; has been alternating  between Ibpuprofen and Tylenol ; does feel like she has a migraine today; did throw up today which she thinks could be related to her migraine; has been trying to drink water with liquid IV supplements;     Objective:  Vitals:   01/24/23 1032  BP: 138/84  Pulse: (!) 114  Temp: 98.5 F (36.9 C)  TempSrc: Oral  SpO2: 95%  Weight: 279 lb 3.2 oz (126.6 kg)  Height: 5' 4 (1.626 m)    General: Well developed, well nourished, in no acute distress; ill appearing Skin : Warm and dry.  Head: Normocephalic and atraumatic  Eyes: Sclera and conjunctiva clear; pupils round and reactive to light; extraocular movements intact  Ears: External normal; canals clear; tympanic membranes normal  Oropharynx: Pink, supple. No suspicious lesions  Neck: Supple without thyromegaly, adenopathy  Lungs: Respirations unlabored; clear to auscultation bilaterally without wheeze, rales, rhonchi  CVS exam: normal rate and regular rhythm.  Neurologic: Alert and oriented; speech intact; face symmetrical; moves all extremities well; CNII-XII intact without focal deficit   Assessment:  1. Acute bronchitis, unspecified organism     Plan:  Rx for Z-pak/ Prednisone ; discussed that Prednisone  can help with migraine as well; encouraged to use Mucinex during the day and take her prescriptive cough syrup at night; increase fluids, rest and follow up worse, no better; if symptoms persist, will need to consider CXR; work note given for today and tomorrow.   No follow-ups on file.  No orders of the defined types were placed in this encounter.   Requested Prescriptions   Signed Prescriptions Disp Refills   azithromycin  (ZITHROMAX ) 250 MG tablet 6 tablet 0    Sig: Take 2 tab po the first day then take 1 tablet po daily for 4 days   predniSONE  (DELTASONE ) 20 MG tablet 5 tablet 0    Sig: Take 1 tablet (20 mg total) by mouth daily with breakfast.

## 2023-01-25 ENCOUNTER — Other Ambulatory Visit: Payer: Self-pay | Admitting: Family Medicine

## 2023-01-25 DIAGNOSIS — F418 Other specified anxiety disorders: Secondary | ICD-10-CM

## 2023-01-31 ENCOUNTER — Encounter (INDEPENDENT_AMBULATORY_CARE_PROVIDER_SITE_OTHER): Payer: Self-pay | Admitting: Family Medicine

## 2023-01-31 ENCOUNTER — Ambulatory Visit (INDEPENDENT_AMBULATORY_CARE_PROVIDER_SITE_OTHER): Payer: BC Managed Care – PPO | Admitting: Family Medicine

## 2023-01-31 VITALS — BP 113/79 | HR 71 | Temp 98.0°F | Ht 63.5 in | Wt 276.0 lb

## 2023-01-31 DIAGNOSIS — Z6841 Body Mass Index (BMI) 40.0 and over, adult: Secondary | ICD-10-CM

## 2023-01-31 DIAGNOSIS — E559 Vitamin D deficiency, unspecified: Secondary | ICD-10-CM

## 2023-01-31 DIAGNOSIS — F5089 Other specified eating disorder: Secondary | ICD-10-CM | POA: Diagnosis not present

## 2023-01-31 DIAGNOSIS — E538 Deficiency of other specified B group vitamins: Secondary | ICD-10-CM | POA: Insufficient documentation

## 2023-01-31 DIAGNOSIS — E669 Obesity, unspecified: Secondary | ICD-10-CM

## 2023-01-31 DIAGNOSIS — F3289 Other specified depressive episodes: Secondary | ICD-10-CM

## 2023-01-31 DIAGNOSIS — E785 Hyperlipidemia, unspecified: Secondary | ICD-10-CM

## 2023-01-31 DIAGNOSIS — F32A Depression, unspecified: Secondary | ICD-10-CM | POA: Insufficient documentation

## 2023-01-31 MED ORDER — VITAMIN D (ERGOCALCIFEROL) 1.25 MG (50000 UNIT) PO CAPS: 50000.0000 [IU] | ORAL_CAPSULE | ORAL | 0 refills | Status: DC

## 2023-01-31 MED ORDER — TOPIRAMATE 25 MG PO TABS: 25.0000 mg | ORAL_TABLET | Freq: Every day | ORAL | 0 refills | Status: DC

## 2023-01-31 NOTE — Progress Notes (Signed)
 .smr  Office: [phone]  /  Fax: [phone]  WEIGHT SUMMARY AND BIOMETRICS  Anthropometric Measurements Height: 5' 3.5 (1.613 m) Weight: 276 lb (125.2 kg) BMI (Calculated): 48.12 Weight at Last Visit: 287 lb Weight Lost Since Last Visit: 11 lb Weight Gained Since Last Visit: 0 Starting Weight: 287 lb Total Weight Loss (lbs): 11 lb (4.99 kg) Peak Weight: 292 lb   Body Composition  Body Fat %: 53.5 % Fat Mass (lbs): 147.6 lbs Muscle Mass (lbs): 122 lbs Total Body Water (lbs): 93.4 lbs Visceral Fat Rating : 18   No data recorded  Chief Complaint: OBESITY    History of Present Illness   The patient, with a history of obesity, hyperlipidemia, vitamin D  deficiency, B12 deficiency, and emotional eating behaviors, presents for a follow-up consultation. She reports an 11-pound weight loss over the past month, attributed to adherence to a category two eating plan. However, the patient admits to not currently engaging in any exercise regimen.  The patient recently experienced a respiratory illness, characterized by cough, chest congestion, fever, chills, and muscle aches. She initially suspected influenza, but the symptoms resolved following a course of azithromycin . The patient notes a lingering cough post-illness.  The patient acknowledges difficulty adhering to the eating plan during the holiday season and during her illness, during which she consumed minimal food for several days. She expresses a struggle with cravings, particularly for sweet and salty foods, which she identifies as a significant barrier to dietary adherence. She also notes difficulty consuming certain fruits due to wearing braces.  The patient has a history of migraines, primarily menstrual migraines, and expresses interest in exploring medication options to manage these. She also reports a history of anxiety, which she manages with medication. She expresses a desire to explore options for managing emotional  eating behaviors and cravings.  The patient is on reliable oral birth control but plans to discontinue it following her husband's planned vasectomy. She expresses a desire to lose weight healthily and sustainably, acknowledging the challenges posed by her environment and lifestyle. She expresses a willingness to try new medications and strategies to support her weight loss and overall health goals.          PHYSICAL EXAM:  Blood pressure 113/79, pulse 71, temperature 98 F (36.7 C), height 5' 3.5 (1.613 m), weight 276 lb (125.2 kg), last menstrual period 01/08/2023, SpO2 97%. Body mass index is 48.12 kg/m.  DIAGNOSTIC DATA REVIEWED:  BMET    Component Value Date/Time   NA 139 12/13/2022 1541   NA 138 07/04/2021 0907   K 5.0 12/13/2022 1541   CL 101 12/13/2022 1541   CO2 31 12/13/2022 1541   GLUCOSE 82 12/13/2022 1541   BUN 18 12/13/2022 1541   BUN 19 07/04/2021 0907   CREATININE 0.91 12/13/2022 1541   CALCIUM  9.5 12/13/2022 1541   GFRNONAA 97 09/11/2018 1145   GFRAA 112 09/11/2018 1145   Lab Results  Component Value Date   HGBA1C 5.5 12/13/2022   HGBA1C 5.4 10/11/2017   Lab Results  Component Value Date   INSULIN  10.5 01/01/2023   INSULIN  10.3 10/11/2017   Lab Results  Component Value Date   TSH 2.78 12/13/2022   CBC    Component Value Date/Time   WBC 7.2 12/13/2022 1541   RBC 4.81 12/13/2022 1541   HGB 14.2 12/13/2022 1541   HGB 14.1 10/11/2017 0849   HCT 43.5 12/13/2022 1541   HCT 42.4 10/11/2017 0849   PLT 311.0 12/13/2022 1541  MCV 90.3 12/13/2022 1541   MCV 87 10/11/2017 0849   MCH 29.1 10/11/2017 0849   MCH 29.4 04/06/2015 1136   MCHC 32.7 12/13/2022 1541   RDW 14.2 12/13/2022 1541   RDW 13.1 10/11/2017 0849   Iron Studies No results found for: IRON, TIBC, FERRITIN, IRONPCTSAT Lipid Panel     Component Value Date/Time   CHOL 227 (H) 12/13/2022 1541   CHOL 229 (H) 09/11/2018 1145   TRIG 89.0 12/13/2022 1541   HDL 82.80 12/13/2022  1541   HDL 85 09/11/2018 1145   CHOLHDL 3 12/13/2022 1541   VLDL 17.8 12/13/2022 1541   LDLCALC 126 (H) 12/13/2022 1541   LDLCALC 125 (H) 09/11/2018 1145   Hepatic Function Panel     Component Value Date/Time   PROT 7.3 12/13/2022 1541   PROT 6.9 09/11/2018 1145   ALBUMIN 3.7 12/13/2022 1541   ALBUMIN 3.8 09/11/2018 1145   AST 16 12/13/2022 1541   ALT 10 12/13/2022 1541   ALKPHOS 103 12/13/2022 1541   BILITOT 0.2 12/13/2022 1541   BILITOT 0.2 09/11/2018 1145      Component Value Date/Time   TSH 2.78 12/13/2022 1541   Nutritional Lab Results  Component Value Date   VD25OH 34.8 01/01/2023   VD25OH 31.37 01/16/2022   VD25OH 34.37 04/18/2021     Assessment and Plan    Obesity Obesity with recent weight loss of 11 pounds over the last month. Following a category two eating plan but not currently exercising. Discussed accountability, diet challenges with braces, and seasonal fruit availability. Prefers healthy weight loss and has emotional eating behaviors. Discussed snack incorporation into meals. - Continue category two eating plan - Incorporate alternative fruits like peaches packed in water, cantaloupe, honeydew, and watermelon (1 cup portion) - Provide eating out handout - Encourage snacks as part of the meal plan - Light walking acceptable; avoid intense exercise  Emotional Eating Behaviors Emotional eating with cravings for sweet and salty foods, particularly in the afternoon. Anxiety well-managed. Discussed topiramate  for emotional eating, weight loss, and migraine prevention. Explained risks including taste disturbances, dehydration, and the importance of contraception while on medication. Weight loss benefits seen at low doses (25-50 mg). - Prescribe topiramate  25 mg daily - Monitor for side effects including taste disturbances and dehydration - Ensure contraception to prevent pregnancy while on topiramate   Hyperlipidemia Hyperlipidemia with elevated LDL (126  mg/dL) despite Crestor . HDL and triglycerides normal. Family history of heart issues and genetic predisposition noted. Discussed diet and medication importance in managing genetic hyperlipidemia. - Continue Crestor  - Recheck lipid panel in 3 months  Vitamin D  Deficiency Vitamin D  level at 34 ng/mL, below optimal range (50-60 ng/mL). Current supplementation insufficient, especially in winter. Discussed benefits of prescription strength vitamin D  for energy levels. - Prescribe vitamin D  50,000 IU weekly - Send prescription to [facility]  Vitamin B12 Deficiency Vitamin B12 level low but not critically deficient. Previous levels in 2019 similar. Discussed dietary sources and potential malabsorption. Prefers dietary improvements first but open to supplementation. Discussed benefits of over-the-counter B12 for energy levels. - Start over-the-counter B12 supplement, 1000 mcg daily  Follow-up - Schedule follow-up appointment in 2 weeks - Make next two appointments in advance to ensure continuity of care.         I have personally spent 40 minutes total time today in preparation, patient care, and documentation for this visit, including the following: review of clinical lab tests; review of medical tests/procedures/services.    She was informed of  the importance of frequent follow up visits to maximize her success with intensive lifestyle modifications for her multiple health conditions.    Louann Penton, MD

## 2023-02-13 ENCOUNTER — Ambulatory Visit (INDEPENDENT_AMBULATORY_CARE_PROVIDER_SITE_OTHER): Payer: Self-pay | Admitting: Family Medicine

## 2023-02-27 ENCOUNTER — Ambulatory Visit (INDEPENDENT_AMBULATORY_CARE_PROVIDER_SITE_OTHER): Payer: BC Managed Care – PPO | Admitting: Internal Medicine

## 2023-02-27 ENCOUNTER — Encounter (INDEPENDENT_AMBULATORY_CARE_PROVIDER_SITE_OTHER): Payer: Self-pay | Admitting: Internal Medicine

## 2023-02-27 VITALS — BP 116/74 | HR 69 | Temp 97.6°F | Ht 63.5 in | Wt 275.0 lb

## 2023-02-27 DIAGNOSIS — E66813 Obesity, class 3: Secondary | ICD-10-CM | POA: Diagnosis not present

## 2023-02-27 DIAGNOSIS — E7800 Pure hypercholesterolemia, unspecified: Secondary | ICD-10-CM

## 2023-02-27 DIAGNOSIS — E559 Vitamin D deficiency, unspecified: Secondary | ICD-10-CM

## 2023-02-27 DIAGNOSIS — Z6841 Body Mass Index (BMI) 40.0 and over, adult: Secondary | ICD-10-CM | POA: Insufficient documentation

## 2023-02-27 DIAGNOSIS — R638 Other symptoms and signs concerning food and fluid intake: Secondary | ICD-10-CM

## 2023-02-27 MED ORDER — VITAMIN D (ERGOCALCIFEROL) 1.25 MG (50000 UNIT) PO CAPS: 50000.0000 [IU] | ORAL_CAPSULE | ORAL | 0 refills | Status: DC

## 2023-02-27 NOTE — Assessment & Plan Note (Signed)
 LDL is not at goal. Elevated LDL may be secondary to nutrition, genetics and spillover effect from excess adiposity. Recommended LDL goal is <70 to reduce the risk of fatty streaks and the progression to obstructive ASCVD in the future.   Her 10 year risk is: The 10-year ASCVD risk score (Arnett DK, et al., 2019) is: 0.3%  Lab Results  Component Value Date   CHOL 227 (H) 12/13/2022   HDL 82.80 12/13/2022   LDLCALC 126 (H) 12/13/2022   TRIG 89.0 12/13/2022   CHOLHDL 3 12/13/2022    Continue weight loss therapy, losing 10% or more of body weight may improve condition. Also advised to reduce saturated fats in diet to less than 10% of daily calories.  She had a normal coronary artery calcium  score in the past.  No need for statin therapy at present time.  Continue with nutrition and behavioral strategies

## 2023-02-27 NOTE — Progress Notes (Signed)
 Office: [phone]  /  Fax: [phone]  Weight Summary And Biometrics  Vitals Temp: 97.6 F (36.4 C) BP: 116/74 Pulse Rate: 69 SpO2: 99 %   Anthropometric Measurements Height: 5' 3.5 (1.613 m) Weight: 275 lb (124.7 kg) BMI (Calculated): 47.94 Weight at Last Visit: 276 lb Weight Lost Since Last Visit: 1 lb Weight Gained Since Last Visit: 0 lb Starting Weight: 287 lb Total Weight Loss (lbs): 12 lb (5.443 kg) Peak Weight: 292 lb   Body Composition  Body Fat %: 53.5 % Fat Mass (lbs): 147.2 lbs Muscle Mass (lbs): 121.4 lbs Total Body Water (lbs): 94.4 lbs Visceral Fat Rating : 18    RMR: [id number]  Today's Visit #: 3  Starting Date: 01/01/23   Subjective   Chief Complaint: Obesity  Selena Flores is here to discuss her progress with her obesity treatment plan. She is on the the Category 2 Plan and states she is following her eating plan approximately 45 % of the time. She states she is not exercising.  Weight Progress Since Last Visit:  Discussed the use of AI scribe software for clinical note transcription with the patient, who gave verbal consent to proceed.  History of Present Illness   Selena Flores is a 43 year old female who presents for medical weight management.  She has concerns about starting topiramate  due to potential side effects and interactions with her current medications, particularly its interaction with her birth control pills. She has not started the medication yet due to these concerns.  She experiences stress-related eating, especially during work stress, leading to increased snacking in the afternoons and evenings. While she maintains a consistent breakfast and lunch routine during weekdays, she struggles with her diet during weekends and evenings when her husband cooks. Her biggest nutritional challenge is making poor choices during stressful times, such as opting for takeout after a stressful day.  Her metabolic rate was previously  measured at 1786 calories, indicating she needs about 1200 calories to lose 1 pound per week.  She is somewhat reluctant to track and journal as she had done this in the past and was not very successful with losing over 100 pounds but she fell into all or none, thinking so she is not interested in monitoring caloric intake.    She has a history of losing 100 pounds about 10-12 years ago by counting calories and working out extensively, but she did not focus on protein intake at that time. She is now trying to find a balance to avoid becoming obsessed with calorie counting while still eating well and enjoying food.  She drinks about 40 ounces of water daily and has difficulty eating certain fruits like apples due to braces, leading her to switch to softer options like fruit cups. She can eat berries due to their softness.     Challenges affecting patient progress: having difficulty focusing on healthy eating, exposure to enticing environments and/or relationships, low volume of physical activity at present , and emotional eating.   Orexigenic Control: Reports problems with appetite and hunger signals.  Denies problems with satiety and satiation.  Denies problems with eating patterns and portion control.  Reports abnormal cravings. Reports feeling deprived or restricted.   Pharmacotherapy for weight management: She is currently taking  have been prescribed topiramate  by Dr. Verdon but patient did not start medication due to concerns with birth control and side effects .   Assessment and Plan   Treatment Plan For Obesity:  Recommended Dietary Goals  Selena Flores is currently in the action stage of change. As such, her goal is to continue weight management plan. She has agreed to: continue to work on implementation of reduced calorie nutrition plan (RCNP) and reviewed balanced plate as a visual aid.  She will work on increasing protein with a target of 30 to 40 g per meal 3 times a day and  incorporating more whole foods into her diet.  We also discussed using AI to find healthier options when eating out.  Behavioral Health and Counseling  We discussed the following behavioral modification strategies today: continue to work on maintaining a reduced calorie state, getting the recommended amount of protein, incorporating whole foods, making healthy choices, staying well hydrated and practicing mindfulness when eating..  Additional education and resources provided today: Provided with personal guidance and instructions on how to use [employer] for healthy meal ideas and cooking in bulk.  Recommended Physical Activity Goals  Selena Flores has been advised to work up to 150 minutes of moderate intensity aerobic activity a week and strengthening exercises 2-3 times per week for cardiovascular health, weight loss maintenance and preservation of muscle mass.   She has agreed to :  Think about enjoyable ways to increase daily physical activity and overcoming barriers to exercise and Increase physical activity in their day and reduce sedentary time (increase NEAT).  Pharmacotherapy  We discussed various medication options to help Selena Flores with her weight loss efforts and we both agreed to : continue with nutritional and behavioral strategies and she will look into GLP-1 coverage and discuss other treatment options with Dr. Verdon at her next office visit  Associated Conditions Impacted by Obesity Treatment  Vitamin D  deficiency Assessment & Plan: On high-dose vitamin D  supplementation without any adverse effects.  Medication refilled.  Consider transitioning to over-the-counter supplementation if appropriate.  Orders: -     Vitamin D  (Ergocalciferol ); Take 1 capsule (50,000 Units total) by mouth every 7 (seven) days.  Dispense: 5 capsule; Refill: 0  Abnormal food appetite Assessment & Plan: She has increased orexigenic signaling, impaired satiety and inhibitory control. This is  secondary to an abnormal energy regulation system and pathological neurohormonal pathways characteristic of excess adiposity.  There is also some psychological drivers in addition to nutritional and behavioral strategies she may benefit from pharmacotherapy.  She received counseling how to manage cravings through cognitive restructuring.  She has not started topiramate  because of concerns about teratogenicity and reduction in birth control effectiveness.  I explained to her that in order to be on this medication safely she would need 2 forms of birth control.  She will discuss other pharmacological options with Dr. Verdon.    Pure hypercholesterolemia Assessment & Plan: LDL is not at goal. Elevated LDL may be secondary to nutrition, genetics and spillover effect from excess adiposity. Recommended LDL goal is <70 to reduce the risk of fatty streaks and the progression to obstructive ASCVD in the future.   Her 10 year risk is: The 10-year ASCVD risk score (Arnett DK, et al., 2019) is: 0.3%  Lab Results  Component Value Date   CHOL 227 (H) 12/13/2022   HDL 82.80 12/13/2022   LDLCALC 126 (H) 12/13/2022   TRIG 89.0 12/13/2022   CHOLHDL 3 12/13/2022    Continue weight loss therapy, losing 10% or more of body weight may improve condition. Also advised to reduce saturated fats in diet to less than 10% of daily calories.  She had a normal coronary artery calcium  score in the past.  No need for statin therapy at present time.  Continue with nutrition and behavioral strategies      Class 3 severe obesity with serious comorbidity and body mass index (BMI) of 45.0 to 49.9 in adult, unspecified obesity type Selena Flores) Assessment & Plan: I reviewed her records, history and most recent labs.  She is affected by obesity associated comorbidities include hypercholesterolemia and vitamin D  deficiency.  She does not have glycemic disorders has had a normal coronary artery calcium  score done in the past, and an MRI  of the liver did not show hepatic steatosis.  Her A1c is normal insulin  levels just above optimal levels not enough to be considered insulin  resistance.  In the past she had a lot of success with calorie restriction and physical activity so she is sensitive to these lifestyle changes.  She is somewhat adverse to tracking and journaling as it gets her into in all or none mindset.  She also has cravings at times associated with stress.  She received counseling today on how to manage these.  She had been prescribed topiramate  by my colleague but did not start medication due to concerns about birth control and teratogenic effects.  She will discuss other treatment options with Dr. Verdon at her upcoming appointment.  She will look into coverage for GLP-1      Objective   Physical Exam:  Blood pressure 116/74, pulse 69, temperature 97.6 F (36.4 C), height 5' 3.5 (1.613 m), weight 275 lb (124.7 kg), last menstrual period 02/06/2023, SpO2 99%. Body mass index is 47.95 kg/m.  General: She is overweight, cooperative, alert, well developed, and in no acute distress. PSYCH: Has normal mood, affect and thought process.   HEENT: EOMI, sclerae are anicteric. Lungs: Normal breathing effort, no conversational dyspnea. Extremities: No edema.  Neurologic: No gross sensory or motor deficits. No tremors or fasciculations noted.    Diagnostic Data Reviewed:  BMET    Component Value Date/Time   NA 139 12/13/2022 1541   NA 138 07/04/2021 0907   K 5.0 12/13/2022 1541   CL 101 12/13/2022 1541   CO2 31 12/13/2022 1541   GLUCOSE 82 12/13/2022 1541   BUN 18 12/13/2022 1541   BUN 19 07/04/2021 0907   CREATININE 0.91 12/13/2022 1541   CALCIUM  9.5 12/13/2022 1541   GFRNONAA 97 09/11/2018 1145   GFRAA 112 09/11/2018 1145   Lab Results  Component Value Date   HGBA1C 5.5 12/13/2022   HGBA1C 5.4 10/11/2017   Lab Results  Component Value Date   INSULIN  10.5 01/01/2023   INSULIN  10.3 10/11/2017   Lab  Results  Component Value Date   TSH 2.78 12/13/2022   CBC    Component Value Date/Time   WBC 7.2 12/13/2022 1541   RBC 4.81 12/13/2022 1541   HGB 14.2 12/13/2022 1541   HGB 14.1 10/11/2017 0849   HCT 43.5 12/13/2022 1541   HCT 42.4 10/11/2017 0849   PLT 311.0 12/13/2022 1541   MCV 90.3 12/13/2022 1541   MCV 87 10/11/2017 0849   MCH 29.1 10/11/2017 0849   MCH 29.4 04/06/2015 1136   MCHC 32.7 12/13/2022 1541   RDW 14.2 12/13/2022 1541   RDW 13.1 10/11/2017 0849   Iron Studies No results found for: IRON, TIBC, FERRITIN, IRONPCTSAT Lipid Panel     Component Value Date/Time   CHOL 227 (H) 12/13/2022 1541   CHOL 229 (H) 09/11/2018 1145   TRIG 89.0 12/13/2022 1541   HDL 82.80 12/13/2022 1541   HDL 85  09/11/2018 1145   CHOLHDL 3 12/13/2022 1541   VLDL 17.8 12/13/2022 1541   LDLCALC 126 (H) 12/13/2022 1541   LDLCALC 125 (H) 09/11/2018 1145   Hepatic Function Panel     Component Value Date/Time   PROT 7.3 12/13/2022 1541   PROT 6.9 09/11/2018 1145   ALBUMIN 3.7 12/13/2022 1541   ALBUMIN 3.8 09/11/2018 1145   AST 16 12/13/2022 1541   ALT 10 12/13/2022 1541   ALKPHOS 103 12/13/2022 1541   BILITOT 0.2 12/13/2022 1541   BILITOT 0.2 09/11/2018 1145      Component Value Date/Time   TSH 2.78 12/13/2022 1541   Nutritional Lab Results  Component Value Date   VD25OH 34.8 01/01/2023   VD25OH 31.37 01/16/2022   VD25OH 34.37 04/18/2021    Follow-Up   Return in about 3 weeks (around 03/20/2023) for Dr. Verdon.SABRA She was informed of the importance of frequent follow up visits to maximize her success with intensive lifestyle modifications for her multiple health conditions.  Attestation Statement   Reviewed by clinician on day of visit: allergies, medications, problem list, medical history, surgical history, family history, social history, and previous encounter notes.   I have spent 40 minutes in the care of the patient today including: preparing to see patient  (e.g. review and interpretation of tests, old notes ), obtaining and/or reviewing separately obtained history, performing a medically appropriate examination or evaluation, counseling and educating the patient, ordering medications, test or procedures, documenting clinical information in the electronic or other health care record, and independently interpreting results and communicating results to the patient, family, or caregiver   Lucas Parker, MD

## 2023-02-27 NOTE — Assessment & Plan Note (Signed)
 I reviewed her records, it seems that even though she has a high body weight does not have significant metabolic derangements.  She has had a normal coronary artery calcium  score done, and an MRI of the liver did not show hepatic steatosis.  Her A1c is normal insulin  levels just above optimal levels not enough to be considered insulin  resistance.

## 2023-02-27 NOTE — Assessment & Plan Note (Signed)
 On high-dose vitamin D  supplementation without any adverse effects.  Medication refilled.  Consider transitioning to over-the-counter supplementation if appropriate.

## 2023-02-27 NOTE — Assessment & Plan Note (Signed)
 She has increased orexigenic signaling, impaired satiety and inhibitory control. This is secondary to an abnormal energy regulation system and pathological neurohormonal pathways characteristic of excess adiposity.  There is also some psychological drivers in addition to nutritional and behavioral strategies she may benefit from pharmacotherapy.  She received counseling how to manage cravings through cognitive restructuring.  She has not started topiramate  because of concerns about teratogenicity and reduction in birth control effectiveness.  I explained to her that in order to be on this medication safely she would need 2 forms of birth control.  She will discuss other pharmacological options with Dr. Verdon.

## 2023-03-12 ENCOUNTER — Other Ambulatory Visit (INDEPENDENT_AMBULATORY_CARE_PROVIDER_SITE_OTHER): Payer: Self-pay | Admitting: Family Medicine

## 2023-03-12 DIAGNOSIS — F3289 Other specified depressive episodes: Secondary | ICD-10-CM

## 2023-04-01 ENCOUNTER — Encounter (INDEPENDENT_AMBULATORY_CARE_PROVIDER_SITE_OTHER): Payer: Self-pay | Admitting: Family Medicine

## 2023-04-01 ENCOUNTER — Ambulatory Visit (INDEPENDENT_AMBULATORY_CARE_PROVIDER_SITE_OTHER): Payer: BC Managed Care – PPO | Admitting: Family Medicine

## 2023-04-01 VITALS — BP 113/76 | HR 72 | Temp 97.8°F | Ht 63.5 in | Wt 275.0 lb

## 2023-04-01 DIAGNOSIS — E559 Vitamin D deficiency, unspecified: Secondary | ICD-10-CM | POA: Diagnosis not present

## 2023-04-01 DIAGNOSIS — E669 Obesity, unspecified: Secondary | ICD-10-CM | POA: Diagnosis not present

## 2023-04-01 DIAGNOSIS — F3289 Other specified depressive episodes: Secondary | ICD-10-CM

## 2023-04-01 DIAGNOSIS — G43909 Migraine, unspecified, not intractable, without status migrainosus: Secondary | ICD-10-CM

## 2023-04-01 DIAGNOSIS — Z6841 Body Mass Index (BMI) 40.0 and over, adult: Secondary | ICD-10-CM

## 2023-04-01 MED ORDER — VITAMIN D (ERGOCALCIFEROL) 1.25 MG (50000 UNIT) PO CAPS: 50000.0000 [IU] | ORAL_CAPSULE | ORAL | 0 refills | Status: DC

## 2023-04-01 MED ORDER — TOPIRAMATE 25 MG PO TABS: 25.0000 mg | ORAL_TABLET | Freq: Every day | ORAL | 0 refills | Status: DC

## 2023-04-01 NOTE — Progress Notes (Signed)
 Office: [phone]  /  Fax: [phone]  WEIGHT SUMMARY AND BIOMETRICS  Anthropometric Measurements Height: 5' 3.5" (1.613 m) Weight: 275 lb (124.7 kg) BMI (Calculated): 47.94 Weight at Last Visit: 275 lb Weight Lost Since Last Visit: 0 Weight Gained Since Last Visit: 0 Starting Weight: 287 lb Total Weight Loss (lbs): 12 lb (5.443 kg) Peak Weight: 292 lb   Body Composition  Body Fat %: 53.4 % Fat Mass (lbs): 147 lbs Muscle Mass (lbs): 122 lbs Total Body Water (lbs): 94.4 lbs Visceral Fat Rating : 17   Other Clinical Data Fasting: No Labs: No Today's Visit #: 4 Starting Date: 01/01/23    Chief Complaint: OBESITY    History of Present Illness   The patient presents for obesity treatment follow-up and progress monitoring.  She follows the category two plan only ten percent of the time over the last five weeks and is not currently exercising, yet she has maintained her weight. She faces challenges with meal planning, particularly for dinner, as her husband, who usually cooks, sometimes brings home treats like cinnamon buns. She struggles with cravings, especially after lunch, and finds it difficult to resist unhealthy snacks when they are available at home. She has tried strategies like having meat on hand for sandwiches to curb cravings but finds it challenging when these are not available.  She has a history of vitamin D deficiency and is on prescription vitamin D, 50,000 IU per week, and requests a refill. She is not currently taking topiramate due to concerns about its interaction with her birth control pills, which she takes reliably. She experiences migraines and anxiety, which she believes could potentially be managed with topiramate.  She recounts a recent experience at the [facility] where she noticed an improvement in comfort in the seats compared to a previous visit when she was heavier. She also mentions that her jeans fit better now, indicating some  weight loss progress. However, she still experiences difficulty with physical activity, such as climbing stairs, which leaves her feeling 'immobile'.          PHYSICAL EXAM:  Blood pressure 113/76, pulse 72, temperature 97.8 F (36.6 C), height 5' 3.5" (1.613 m), weight 275 lb (124.7 kg), SpO2 98%. Body mass index is 47.95 kg/m.  DIAGNOSTIC DATA REVIEWED:  BMET    Component Value Date/Time   NA 139 12/13/2022 1541   NA 138 07/04/2021 0907   K 5.0 12/13/2022 1541   CL 101 12/13/2022 1541   CO2 31 12/13/2022 1541   GLUCOSE 82 12/13/2022 1541   BUN 18 12/13/2022 1541   BUN 19 07/04/2021 0907   CREATININE 0.91 12/13/2022 1541   CALCIUM 9.5 12/13/2022 1541   GFRNONAA 97 09/11/2018 1145   GFRAA 112 09/11/2018 1145   Lab Results  Component Value Date   HGBA1C 5.5 12/13/2022   HGBA1C 5.4 10/11/2017   Lab Results  Component Value Date   INSULIN 10.5 01/01/2023   INSULIN 10.3 10/11/2017   Lab Results  Component Value Date   TSH 2.78 12/13/2022   CBC    Component Value Date/Time   WBC 7.2 12/13/2022 1541   RBC 4.81 12/13/2022 1541   HGB 14.2 12/13/2022 1541   HGB 14.1 10/11/2017 0849   HCT 43.5 12/13/2022 1541   HCT 42.4 10/11/2017 0849   PLT 311.0 12/13/2022 1541   MCV 90.3 12/13/2022 1541   MCV 87 10/11/2017 0849   MCH 29.1 10/11/2017 0849   MCH 29.4 04/06/2015 1136   MCHC  32.7 12/13/2022 1541   RDW 14.2 12/13/2022 1541   RDW 13.1 10/11/2017 0849   Iron Studies No results found for: "IRON", "TIBC", "FERRITIN", "IRONPCTSAT" Lipid Panel     Component Value Date/Time   CHOL 227 (H) 12/13/2022 1541   CHOL 229 (H) 09/11/2018 1145   TRIG 89.0 12/13/2022 1541   HDL 82.80 12/13/2022 1541   HDL 85 09/11/2018 1145   CHOLHDL 3 12/13/2022 1541   VLDL 17.8 12/13/2022 1541   LDLCALC 126 (H) 12/13/2022 1541   LDLCALC 125 (H) 09/11/2018 1145   Hepatic Function Panel     Component Value Date/Time   PROT 7.3 12/13/2022 1541   PROT 6.9 09/11/2018 1145   ALBUMIN  3.7 12/13/2022 1541   ALBUMIN 3.8 09/11/2018 1145   AST 16 12/13/2022 1541   ALT 10 12/13/2022 1541   ALKPHOS 103 12/13/2022 1541   BILITOT 0.2 12/13/2022 1541   BILITOT 0.2 09/11/2018 1145      Component Value Date/Time   TSH 2.78 12/13/2022 1541   Nutritional Lab Results  Component Value Date   VD25OH 34.8 01/01/2023   VD25OH 31.37 01/16/2022   VD25OH 34.37 04/18/2021     Assessment and Plan    Obesity Follow-up on obesity management. Adhering to category two plan 10% of the time over the last five weeks. Not currently exercising. Maintained weight. Discussed importance of meal planning and having appropriate foods to manage cravings. Emphasized reliable meal planning, especially for dinner. Suggested grocery delivery services. Encouraged communication with husband for support. Patient prefers to focus on meal planning before starting formal exercise. - Continue category two evening plan - Delay formal exercise until meal planning improves - Follow up in 3-4 weeks  Migraine and EEB Migraine management. Not taking prescribed topiramate due to concerns about interaction with birth control. Discussed that studies show topiramate doses below 100 mg do not interfere with birth control efficacy. Emphasized importance of reliable birth control to prevent pregnancy due to risk of birth defects with topiramate. Patient considering starting topiramate. - Patient to decide on starting topiramate 25mg  and inform at next visit  Vitamin D Deficiency Vitamin D deficiency. On prescription vitamin D, 50,000 IU per week. Requests a refill. STable - Send prescription for vitamin D, 50,000 IU per week  General Health Maintenance Discussed importance of maintaining a healthy diet and regular exercise. Emphasized need for meal planning and having healthy food options available at home. - Encourage use of grocery delivery services - Communicate with husband about supporting healthy eating  habits  Follow-up - Follow up in 3-4 weeks.        She was informed of the importance of frequent follow up visits to maximize her success with intensive lifestyle modifications for her multiple health conditions.    Quillian Quince, MD

## 2023-04-02 ENCOUNTER — Encounter: Payer: BC Managed Care – PPO | Admitting: Family Medicine

## 2023-04-30 ENCOUNTER — Other Ambulatory Visit: Payer: Self-pay | Admitting: Family Medicine

## 2023-04-30 DIAGNOSIS — F418 Other specified anxiety disorders: Secondary | ICD-10-CM

## 2023-05-09 ENCOUNTER — Ambulatory Visit (INDEPENDENT_AMBULATORY_CARE_PROVIDER_SITE_OTHER): Admitting: Family Medicine

## 2023-05-30 DIAGNOSIS — Z01419 Encounter for gynecological examination (general) (routine) without abnormal findings: Secondary | ICD-10-CM | POA: Diagnosis not present

## 2023-05-30 DIAGNOSIS — Z1231 Encounter for screening mammogram for malignant neoplasm of breast: Secondary | ICD-10-CM | POA: Diagnosis not present

## 2023-05-30 LAB — HM MAMMOGRAPHY

## 2023-06-05 ENCOUNTER — Other Ambulatory Visit: Payer: Self-pay | Admitting: Obstetrics and Gynecology

## 2023-06-05 DIAGNOSIS — R928 Other abnormal and inconclusive findings on diagnostic imaging of breast: Secondary | ICD-10-CM

## 2023-06-24 ENCOUNTER — Ambulatory Visit
Admission: RE | Admit: 2023-06-24 | Discharge: 2023-06-24 | Disposition: A | Discharge status: Home / Self Care | Source: Ambulatory Visit | Attending: Obstetrics and Gynecology | Admitting: Obstetrics and Gynecology

## 2023-06-24 ENCOUNTER — Other Ambulatory Visit: Payer: Self-pay | Admitting: Obstetrics and Gynecology

## 2023-06-24 DIAGNOSIS — N6310 Unspecified lump in the right breast, unspecified quadrant: Secondary | ICD-10-CM

## 2023-06-24 DIAGNOSIS — R928 Other abnormal and inconclusive findings on diagnostic imaging of breast: Secondary | ICD-10-CM

## 2023-06-24 DIAGNOSIS — N6314 Unspecified lump in the right breast, lower inner quadrant: Secondary | ICD-10-CM | POA: Diagnosis not present

## 2023-06-28 ENCOUNTER — Ambulatory Visit
Admission: RE | Admit: 2023-06-28 | Discharge: 2023-06-28 | Disposition: A | Discharge status: Home / Self Care | Source: Ambulatory Visit | Attending: Obstetrics and Gynecology | Admitting: Obstetrics and Gynecology

## 2023-06-28 DIAGNOSIS — N6314 Unspecified lump in the right breast, lower inner quadrant: Secondary | ICD-10-CM | POA: Diagnosis not present

## 2023-06-28 DIAGNOSIS — N6011 Diffuse cystic mastopathy of right breast: Secondary | ICD-10-CM | POA: Diagnosis not present

## 2023-06-28 DIAGNOSIS — R92321 Mammographic fibroglandular density, right breast: Secondary | ICD-10-CM | POA: Diagnosis not present

## 2023-06-28 DIAGNOSIS — N6310 Unspecified lump in the right breast, unspecified quadrant: Secondary | ICD-10-CM

## 2023-06-28 HISTORY — PX: BREAST BIOPSY: SHX20

## 2023-06-28 LAB — HM MAMMOGRAPHY: HM Mammogram: ABNORMAL — AB (ref 0–4)

## 2023-07-01 LAB — SURGICAL PATHOLOGY

## 2023-07-21 ENCOUNTER — Other Ambulatory Visit: Payer: Self-pay | Admitting: Cardiology

## 2023-08-12 ENCOUNTER — Telehealth: Payer: Self-pay | Admitting: Cardiology

## 2023-08-12 ENCOUNTER — Other Ambulatory Visit: Payer: Self-pay

## 2023-08-12 MED ORDER — ROSUVASTATIN CALCIUM 5 MG PO TABS: 5.0000 mg | ORAL_TABLET | Freq: Every day | ORAL | 0 refills | Status: DC

## 2023-08-12 NOTE — Telephone Encounter (Signed)
*  STAT* If patient is at the pharmacy, call can be transferred to refill team.   1. Which medications need to be refilled? (please list name of each medication and dose if known) rosuvastatin (CRESTOR) 5 MG tablet   2. Which pharmacy/location (including street and city if local pharmacy) is medication to be sent to? [facility] [place] to [facility]   3. Do they need a 30 day or 90 day supply? [place]

## 2023-08-12 NOTE — Telephone Encounter (Signed)
 RX sent to requested Pharmacy

## 2023-09-26 ENCOUNTER — Other Ambulatory Visit: Payer: Self-pay | Admitting: Cardiology

## 2023-11-05 ENCOUNTER — Encounter: Payer: Self-pay | Admitting: *Deleted

## 2023-11-08 ENCOUNTER — Encounter: Payer: Self-pay | Admitting: Cardiology

## 2023-11-08 ENCOUNTER — Ambulatory Visit: Attending: Cardiology | Admitting: Cardiology

## 2023-11-08 VITALS — BP 110/80 | HR 74 | Ht 64.0 in | Wt 287.6 lb

## 2023-11-08 DIAGNOSIS — E782 Mixed hyperlipidemia: Secondary | ICD-10-CM

## 2023-11-08 DIAGNOSIS — Z6841 Body Mass Index (BMI) 40.0 and over, adult: Secondary | ICD-10-CM | POA: Diagnosis not present

## 2023-11-08 DIAGNOSIS — R0789 Other chest pain: Secondary | ICD-10-CM

## 2023-11-08 LAB — LIPID PANEL
Chol/HDL Ratio: 3.1 ratio (ref 0.0–4.4)
Cholesterol, Total: 255 mg/dL — ABNORMAL HIGH (ref 100–199)
HDL: 81 mg/dL (ref >39)
LDL Chol Calc (NIH): 148 mg/dL — ABNORMAL HIGH (ref 0–99)
Triglycerides: 150 mg/dL — ABNORMAL HIGH (ref 0–149)
VLDL Cholesterol Cal: 26 mg/dL (ref 5–40)

## 2023-11-08 MED ORDER — ROSUVASTATIN CALCIUM 5 MG PO TABS: 5.0000 mg | ORAL_TABLET | Freq: Every day | ORAL | 3 refills | Status: AC

## 2023-11-08 NOTE — Progress Notes (Signed)
 Cardiology Office Note:    Date:  11/08/2023   ID:  Selena Flores, DOB July 20, 1980, MRN [id number]  PCP:  Domenica Harlene LABOR, MD  Cardiologist:  Dub Huntsman, DO  Electrophysiologist:  None   Referring MD: Domenica Harlene LABOR, MD    I am doing well   History of Present Illness:    Selena Flores is a 43 y.o. female with a hx of chronic kidney disease, GERD, hyperlipidemia here today for follow-up.  At her last visit in June 2023 show experiencing chest discomfort I sent the patient for a coronary CTA which showed no evidence of CAD.   He offers no complaints at this time.  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   Patellofemoral syndrome 02/17/2015   Plantar fasciitis 11/28/2017   Preventative health care 10/15/2017   GYN: Dr [facility]   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   Vitamin D  deficiency 11/28/2017    Past Surgical History:  Procedure Laterality Date   BREAST BIOPSY Right 06/28/2023   US  RT BREAST BX W LOC DEV 1ST LESION IMG BX SPEC US  GUIDE 06/28/2023 [facility]   CERVICAL CONE BIOPSY     TONSILLECTOMY      Current Medications: Current Meds  Medication Sig   aspirin-acetaminophen -caffeine (EXCEDRIN MIGRAINE) 250-250-65 MG tablet Take 1 tablet by mouth every 6 (six) hours as needed for headache.   calcium  carbonate (TUMS - DOSED IN MG ELEMENTAL CALCIUM ) 500 MG chewable tablet Chew 1 tablet by mouth daily  as needed for indigestion or heartburn.   cetirizine  (ZYRTEC ) 10 MG tablet Take 1 tablet (10 mg total) by mouth daily.   Cholecalciferol (VITAMIN D3) 50 MCG (2000 UT) TABS Take by mouth.   hydrOXYzine  (ATARAX ) 10 MG tablet Take 1-2 tablets (10-20 mg total) by mouth every 8 (eight) hours as needed.   Norgestimate-Ethinyl Estradiol Triphasic 0.18/0.215/0.25 MG-35 MCG tablet Take 1 tablet by mouth daily.   sertraline  (ZOLOFT ) 100 MG tablet Take 2 tablets (200 mg total) by mouth at bedtime.   [DISCONTINUED] rosuvastatin  (CRESTOR ) 5 MG tablet Take 1 tablet (5 mg total) by mouth at bedtime.     Allergies:   Ciprofloxacin, Rocephin [ceftriaxone], and Penicillins   Social History   Socioeconomic History   Marital status: Married    Spouse name: Markala Sitts   Number of children: 0   Years of education: Not on file   Highest education level: Some college, no degree  Occupational History   Occupation: Arts development officer    Comment: Customer Service   Tobacco Use   Smoking status: Never   Smokeless tobacco: Never   Tobacco comments:    smoked for 1 yr socially in college  Vaping Use   Vaping status: Never Used  Substance and Sexual Activity   Alcohol use: Yes    Comment: social-occ   Drug use: Never   Sexual activity:  Yes    Birth control/protection: Pill  Other Topics Concern   Not on file  Social History Narrative   Lives with boyfriend, has 3 dogs, works in Clinical biochemist with [employer]   No dietary restrictions, walking   Social Drivers of Corporate investment banker Strain: Low Risk  (01/24/2023)   Overall Financial Resource Strain (CARDIA)    Difficulty of Paying Living Expenses: Not hard at all  Food Insecurity: No Food Insecurity (01/24/2023)   Hunger Vital Sign    Worried About Running Out of Food in the Last Year: Never true    Ran Out of Food in the Last Year: Never true  Transportation Needs: No Transportation Needs (01/24/2023)   PRAPARE -  Administrator, Civil Service (Medical): No    Lack of Transportation (Non-Medical): No  Physical Activity: Inactive (01/24/2023)   Exercise Vital Sign    Days of Exercise per Week: 0 days    Minutes of Exercise per Session: 20 min  Stress: No Stress Concern Present (01/24/2023)   [employer] of Occupational Health - Occupational Stress Questionnaire    Feeling of Stress : Only a little  Social Connections: Moderately Isolated (01/24/2023)   Social Connection and Isolation Panel    Frequency of Communication with Friends and Family: Twice a week    Frequency of Social Gatherings with Friends and Family: Twice a week    Attends Religious Services: Never    Database administrator or Organizations: No    Attends Engineer, structural: Not on file    Marital Status: Married     Family History: The patient's family history includes Anxiety disorder in her mother; Cancer in her father; Depression in her mother; Heart disease in her maternal grandmother, maternal uncle, and mother; Heart failure in her mother; Obesity in her mother; Skin cancer in her father.  ROS:   Review of Systems  Constitution: Negative for decreased appetite, fever and weight gain.  HENT: Negative for congestion, ear discharge, hoarse voice and sore throat.   Eyes: Negative for discharge, redness, vision loss in right eye and visual halos.  Cardiovascular: Negative for chest pain, dyspnea on exertion, leg swelling, orthopnea and palpitations.  Respiratory: Negative for cough, hemoptysis, shortness of breath and snoring.   Endocrine: Negative for heat intolerance and polyphagia.  Hematologic/Lymphatic: Negative for bleeding problem. Does not bruise/bleed easily.  Skin: Negative for flushing, nail changes, rash and suspicious lesions.  Musculoskeletal: Negative for arthritis, joint pain, muscle cramps, myalgias, neck pain and stiffness.  Gastrointestinal: Negative for abdominal pain, bowel  incontinence, diarrhea and excessive appetite.  Genitourinary: Negative for decreased libido, genital sores and incomplete emptying.  Neurological: Negative for brief paralysis, focal weakness, headaches and loss of balance.  Psychiatric/Behavioral: Negative for altered mental status, depression and suicidal ideas.  Allergic/Immunologic: Negative for HIV exposure and persistent infections.    EKGs/Labs/Other Studies Reviewed:    The following studies were reviewed today:   EKG:  The ekg ordered today demonstrates   CCTA 07/27/2021  FINDINGS: Quality: Good, HR 63   Coronary calcium  score: The patient's coronary artery calcium  score is 0, which places the patient in the 0 percentile.   Coronary arteries: Normal coronary origins.  Right dominance.   Right Coronary Artery: Dominant. Normal vessel. Normal R-PDA and R-PLB branches.   Left Main Coronary Artery: Normal. Bifurcates into the LAD and LCx arteries.   Left Anterior Descending Coronary Artery: Smaller anterior artery which does not reach  the apex and ends in a pitchfork bifurcation. No disease. 2 diagonal branches without disease.   Left Circumflex Artery: AV groove vessel - no disease. Large OM branch which bifurcates, no disease.   Aorta: Normal size, 26 mm at the mid ascending aorta (level of the PA bifurcation) measured double oblique. No calcifications. No dissection.   Aortic Valve: Trileaflet. No calcifications.   Other findings:   Normal pulmonary vein drainage into the left atrium.   Normal left atrial appendage without a thrombus.   Normal size of the pulmonary artery.   PLAQUE ANALYSIS: Automated plaque analysis did not identify any coronary plaque.   IMPRESSION: 1. No evidence of CAD, CADRADS = 0.   2. Coronary calcium  score of 0. This was 0 percentile for age and sex matched control.   3. Normal coronary origin with right dominance.   4. Consider non-coronary causes of chest pain.    Electronically Signed: By: Vinie JAYSON Maxcy M.D. On: 07/27/2021 09:54  Recent Labs: 12/13/2022: ALT 10; BUN 18; Creatinine, Ser 0.91; Hemoglobin 14.2; Platelets 311.0; Potassium 5.0; Sodium 139; TSH 2.78  Recent Lipid Panel    Component Value Date/Time   CHOL 227 (H) 12/13/2022 1541   CHOL 229 (H) 09/11/2018 1145   TRIG 89.0 12/13/2022 1541   HDL 82.80 12/13/2022 1541   HDL 85 09/11/2018 1145   CHOLHDL 3 12/13/2022 1541   VLDL 17.8 12/13/2022 1541   LDLCALC 126 (H) 12/13/2022 1541   LDLCALC 125 (H) 09/11/2018 1145    Physical Exam:    VS:  BP 110/80 (BP Location: Left Arm, Patient Position: Sitting, Cuff Size: Large)   Pulse 74   Ht 5' 4 (1.626 m)   Wt 287 lb 9.6 oz (130.5 kg)   SpO2 94%   BMI 49.37 kg/m     Wt Readings from Last 3 Encounters:  11/08/23 287 lb 9.6 oz (130.5 kg)  04/01/23 275 lb (124.7 kg)  02/27/23 275 lb (124.7 kg)     GEN: Well nourished, well developed in no acute distress HEENT: Normal NECK: No JVD; No carotid bruits LYMPHATICS: No lymphadenopathy CARDIAC: S1S2 noted,RRR, no murmurs, rubs, gallops RESPIRATORY:  Clear to auscultation without rales, wheezing or rhonchi  ABDOMEN: Soft, non-tender, non-distended, +bowel sounds, no guarding. EXTREMITIES: No edema, No cyanosis, no clubbing MUSCULOSKELETAL:  No deformity  SKIN: Warm and dry NEUROLOGIC:  Alert and oriented x 3, non-focal PSYCHIATRIC:  Normal affect, good insight  ASSESSMENT:    1. Atypical chest pain   2. Mixed hyperlipidemia   3. Morbid obesity with BMI of 45.0-49.9, adult (HCC)    PLAN:    She is doing well from a CV standpoint. Will continue her current medication regimen. Will send refill   I discussed with her that it will be best her pcp takes over the crestor  prescription then her appropriate follow up will be as needed.   Lifestyle modification advised.   The patient is in agreement with the above plan. The patient left the office in stable condition.  The patient  will follow up as needed.   Medication Adjustments/Labs and Tests Ordered: Current medicines are reviewed at length with the patient today.  Concerns regarding medicines are outlined above.  Orders Placed This Encounter  Procedures   Lipid panel   EKG 12-Lead   Meds ordered this encounter  Medications   rosuvastatin  (CRESTOR ) 5 MG tablet    Sig: Take 1 tablet (5 mg total) by mouth at bedtime.    Dispense:  90 tablet    Refill:  3    Patient Instructions  Medication Instructions:    Rosuvastatin  90 mg  daily  *If you need a refill on your cardiac medications before your next appointment, please call your pharmacy*   Lab Work: lipids If you have labs (blood work) drawn today and your tests are completely normal, you will receive your results only by: MyChart Message (if you have MyChart) OR A paper copy in the mail If you have any lab test that is abnormal or we need to change your treatment, we will call you to review the results.   Testing/Procedures: Not needed   Follow-Up: At [facility], you and your health needs are our priority.  As part of our continuing mission to provide you with exceptional heart care, we have created designated Provider Care Teams.  These Care Teams include your primary Cardiologist (physician) and Advanced Practice Providers (APPs -  Physician Assistants and Nurse Practitioners) who all work together to provide you with the care you need, when you need it.     Your next appointment:   As needed   The format for your next appointment:   In Person  Provider:   Edward Trevino, DO   Other Instructions    Adopting a Healthy Lifestyle.  Know what a healthy weight is for you (roughly BMI <25) and aim to maintain this   Aim for 7+ servings of fruits and vegetables daily   65-80+ fluid ounces of water or unsweet tea for healthy kidneys   Limit to max 1 drink of alcohol per day; avoid smoking/tobacco   Limit animal fats in diet for  cholesterol and heart health - choose grass fed whenever available   Avoid highly processed foods, and foods high in saturated/trans fats   Aim for low stress - take time to unwind and care for your mental health   Aim for 150 min of moderate intensity exercise weekly for heart health, and weights twice weekly for bone health   Aim for 7-9 hours of sleep daily   When it comes to diets, agreement about the perfect plan isnt easy to find, even among the experts. Experts at the [facility] [employer] developed an idea known as the Healthy Eating Plate. Just imagine a plate divided into logical, healthy portions.   The emphasis is on diet quality:   Load up on vegetables and fruits - one-half of your plate: Aim for color and variety, and remember that potatoes dont count.   Go for whole grains - one-quarter of your plate: Whole wheat, barley, wheat berries, quinoa, oats, brown rice, and foods made with them. If you want pasta, go with whole wheat pasta.   Protein power - one-quarter of your plate: Fish, chicken, beans, and nuts are all healthy, versatile protein sources. Limit red meat.   The diet, however, does go beyond the plate, offering a few other suggestions.   Use healthy plant oils, such as olive, canola, soy, corn, sunflower and peanut. Check the labels, and avoid partially hydrogenated oil, which have unhealthy trans fats.   If youre thirsty, drink water. Coffee and tea are good in moderation, but skip sugary drinks and limit milk and dairy products to one or two daily servings.   The type of carbohydrate in the diet is more important than the amount. Some sources of carbohydrates, such as vegetables, fruits, whole grains, and beans-are healthier than others.   Finally, stay active  Signed,  Dub Huntsman, DO  11/08/2023 10:18 AM    [facility]

## 2023-11-08 NOTE — Patient Instructions (Signed)
 Medication Instructions:    Rosuvastatin  90 mg  daily  *If you need a refill on your cardiac medications before your next appointment, please call your pharmacy*   Lab Work: lipids If you have labs (blood work) drawn today and your tests are completely normal, you will receive your results only by: MyChart Message (if you have MyChart) OR A paper copy in the mail If you have any lab test that is abnormal or we need to change your treatment, we will call you to review the results.   Testing/Procedures: Not needed   Follow-Up: At [facility], you and your health needs are our priority.  As part of our continuing mission to provide you with exceptional heart care, we have created designated Provider Care Teams.  These Care Teams include your primary Cardiologist (physician) and Advanced Practice Providers (APPs -  Physician Assistants and Nurse Practitioners) who all work together to provide you with the care you need, when you need it.     Your next appointment:   As needed   The format for your next appointment:   In Person  Provider:   Kardie Tobb, DO   Other Instructions

## 2023-11-12 ENCOUNTER — Ambulatory Visit: Payer: Self-pay | Admitting: Cardiology

## 2023-11-12 DIAGNOSIS — E782 Mixed hyperlipidemia: Secondary | ICD-10-CM

## 2023-12-15 NOTE — Assessment & Plan Note (Signed)
 Patient encouraged to maintain heart healthy diet, regular exercise, adequate sleep. Consider daily probiotics. Take medications as prescribed. Labs ordered and reviewed

## 2023-12-15 NOTE — Assessment & Plan Note (Signed)
 Supplement and monitor

## 2023-12-15 NOTE — Assessment & Plan Note (Signed)
 Encourage heart healthy diet such as MIND or DASH diet, increase exercise, avoid trans fats, simple carbohydrates and processed foods, consider a krill or fish or flaxseed oil cap daily.

## 2023-12-16 ENCOUNTER — Encounter: Payer: Self-pay | Admitting: Family Medicine

## 2023-12-16 ENCOUNTER — Ambulatory Visit: Payer: BC Managed Care – PPO | Admitting: Family Medicine

## 2023-12-16 VITALS — BP 126/82 | HR 65 | Temp 98.0°F | Resp 16 | Ht 64.0 in | Wt 285.0 lb

## 2023-12-16 DIAGNOSIS — E88819 Insulin resistance, unspecified: Secondary | ICD-10-CM

## 2023-12-16 DIAGNOSIS — F32A Depression, unspecified: Secondary | ICD-10-CM

## 2023-12-16 DIAGNOSIS — E559 Vitamin D deficiency, unspecified: Secondary | ICD-10-CM

## 2023-12-16 DIAGNOSIS — F419 Anxiety disorder, unspecified: Secondary | ICD-10-CM

## 2023-12-16 DIAGNOSIS — Z0000 Encounter for general adult medical examination without abnormal findings: Secondary | ICD-10-CM

## 2023-12-16 DIAGNOSIS — Z23 Encounter for immunization: Secondary | ICD-10-CM | POA: Diagnosis not present

## 2023-12-16 DIAGNOSIS — E785 Hyperlipidemia, unspecified: Secondary | ICD-10-CM | POA: Diagnosis not present

## 2023-12-16 DIAGNOSIS — E538 Deficiency of other specified B group vitamins: Secondary | ICD-10-CM | POA: Diagnosis not present

## 2023-12-16 DIAGNOSIS — F605 Obsessive-compulsive personality disorder: Secondary | ICD-10-CM

## 2023-12-16 DIAGNOSIS — R41840 Attention and concentration deficit: Secondary | ICD-10-CM

## 2023-12-16 NOTE — Patient Instructions (Addendum)
 Annual covid and flu vaccines  All [facility] pharmacies are now walk in vaccine clinics m-f 9-4 Preventive Care 73-41 Years Old, Female Preventive care refers to lifestyle choices and visits with your health care provider that can promote health and wellness. Preventive care visits are also called wellness exams. What can I expect for my preventive care visit? Counseling Your health care provider may ask you questions about your: Medical history, including: Past medical problems. Family medical history. Pregnancy history. Current health, including: Menstrual cycle. Method of birth control. Emotional well-being. Home life and relationship well-being. Sexual activity and sexual health. Lifestyle, including: Alcohol, nicotine or tobacco, and drug use. Access to firearms. Diet, exercise, and sleep habits. Work and work astronomer. Sunscreen use. Safety issues such as seatbelt and bike helmet use. Physical exam Your health care provider will check your: Height and weight. These may be used to calculate your BMI (body mass index). BMI is a measurement that tells if you are at a healthy weight. Waist circumference. This measures the distance around your waistline. This measurement also tells if you are at a healthy weight and may help predict your risk of certain diseases, such as type 2 diabetes and high blood pressure. Heart rate and blood pressure. Body temperature. Skin for abnormal spots. What immunizations do I need?  Vaccines are usually given at various ages, according to a schedule. Your health care provider will recommend vaccines for you based on your age, medical history, and lifestyle or other factors, such as travel or where you work. What tests do I need? Screening Your health care provider may recommend screening tests for certain conditions. This may include: Lipid and cholesterol levels. Diabetes screening. This is done by checking your blood sugar (glucose) after you  have not eaten for a while (fasting). Pelvic exam and Pap test. Hepatitis B test. Hepatitis C test. HIV (human immunodeficiency virus) test. STI (sexually transmitted infection) testing, if you are at risk. Lung cancer screening. Colorectal cancer screening. Mammogram. Talk with your health care provider about when you should start having regular mammograms. This may depend on whether you have a family history of breast cancer. BRCA-related cancer screening. This may be done if you have a family history of breast, ovarian, tubal, or peritoneal cancers. Bone density scan. This is done to screen for osteoporosis. Talk with your health care provider about your test results, treatment options, and if necessary, the need for more tests. Follow these instructions at home: Eating and drinking  Eat a diet that includes fresh fruits and vegetables, whole grains, lean protein, and low-fat dairy products. Take vitamin and mineral supplements as recommended by your health care provider. Do not drink alcohol if: Your health care provider tells you not to drink. You are pregnant, may be pregnant, or are planning to become pregnant. If you drink alcohol: Limit how much you have to 0-1 drink a day. Know how much alcohol is in your drink. In the [place] one drink equals one 12 oz bottle of beer (355 mL), one 5 oz glass of wine (148 mL), or one 1 oz glass of hard liquor (44 mL). Lifestyle Brush your teeth every morning and night with fluoride toothpaste. Floss one time each day. Exercise for at least 30 minutes 5 or more days each week. Do not use any products that contain nicotine or tobacco. These products include cigarettes, chewing tobacco, and vaping devices, such as e-cigarettes. If you need help quitting, ask your health care provider. Do not use drugs.  If you are sexually active, practice safe sex. Use a condom or other form of protection to prevent STIs. If you do not wish to become pregnant, use  a form of birth control. If you plan to become pregnant, see your health care provider for a prepregnancy visit. Take aspirin only as told by your health care provider. Make sure that you understand how much to take and what form to take. Work with your health care provider to find out whether it is safe and beneficial for you to take aspirin daily. Find healthy ways to manage stress, such as: Meditation, yoga, or listening to music. Journaling. Talking to a trusted person. Spending time with friends and family. Minimize exposure to UV radiation to reduce your risk of skin cancer. Safety Always wear your seat belt while driving or riding in a vehicle. Do not drive: If you have been drinking alcohol. Do not ride with someone who has been drinking. When you are tired or distracted. While texting. If you have been using any mind-altering substances or drugs. Wear a helmet and other protective equipment during sports activities. If you have firearms in your house, make sure you follow all gun safety procedures. Seek help if you have been physically or sexually abused. What's next? Visit your health care provider once a year for an annual wellness visit. Ask your health care provider how often you should have your eyes and teeth checked. Stay up to date on all vaccines. This information is not intended to replace advice given to you by your health care provider. Make sure you discuss any questions you have with your health care provider. Document Revised: 07/06/2020 Document Reviewed: 07/06/2020 Elsevier Patient Education  2024 [employer].

## 2023-12-16 NOTE — Progress Notes (Signed)
 Subjective:    Patient ID: Selena Flores, female    DOB: 1980/07/07, 42 y.o.   MRN: [id number]  Chief Complaint  Patient presents with   Annual Exam    Patient presents today for a annual exam.   Quality Metric Gaps    Mammogram, HPV vaccines, Hep B vaccines    HPI Discussed the use of AI scribe software for clinical note transcription with the patient, who gave verbal consent to proceed.  History of Present Illness Selena Flores is a 43 year old female who presents for annual complete physical exam, follow up on chronic medical concerns and evaluation of possible ADD.  She experiences difficulties with task completion and feels overwhelmed at work, leading her to question whether she might have ADD. Her symptoms include executive dysfunction, characterized by an 'all or nothing' approach to tasks, and a history of picking up and dropping hobbies.  She has a history of OCD and anxiety, for which she is currently on medication. Despite treatment, she continues to experience anxiety attacks and driving-related OCD symptoms, with variability in symptom severity ('good days and bad days').   She describes a past experience with weight loss when she and another household member focused intensely on dieting. She recalls being prescribed metformin  in the past for borderline insulin  resistance, which helped with her symptoms of nausea and headaches when not eating.  She is currently taking over-the-counter vitamin D  at a dose of 2000 IU daily, but has stopped taking B12 supplements.    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   Patellofemoral syndrome 02/17/2015   Plantar fasciitis 11/28/2017   Preventative health care 10/15/2017   GYN: Dr [facility]   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   Vitamin D  deficiency 11/28/2017    Past Surgical History:  Procedure Laterality Date   BREAST BIOPSY Right 06/28/2023   US  RT BREAST BX W LOC DEV 1ST LESION IMG BX SPEC US  GUIDE 06/28/2023 [facility]AM[facility]GR[facility]   CERVICAL CONE BIOPSY     TONSILLECTOMY      Family History  Problem Relation Age of Onset   Heart disease Mother        chf   Obesity Mother    Anxiety disorder Mother    Heart failure Mother    Depression Mother    Skin cancer Father    Cancer Father        skin cancer   Heart disease Maternal Uncle    Heart disease Maternal Grandmother     Social History   Socioeconomic History   Marital status: Married    Spouse name: Aniaya Bacha   Number of children: 0   Years of education: Not on file   Highest education level: Some college, no degree  Occupational History   Occupation: Arts development officer    Comment: Customer Service   Tobacco Use   Smoking  status: Never   Smokeless tobacco: Never   Tobacco comments:    smoked for 1 yr socially in college  Vaping Use   Vaping status: Never Used  Substance and Sexual Activity   Alcohol use: Yes    Comment: social-occ   Drug use: Never   Sexual activity: Yes    Birth control/protection: Pill  Other Topics Concern   Not on file  Social History Narrative   Lives with boyfriend, has 3 dogs, works in clinical biochemist with [employer]   No dietary restrictions, walking   Social Drivers of Corporate Investment Banker Strain: Low Risk  (12/13/2023)   Overall Financial Resource Strain (CARDIA)    Difficulty of Paying Living Expenses: Not hard at all   Food Insecurity: No Food Insecurity (12/13/2023)   Hunger Vital Sign    Worried About Running Out of Food in the Last Year: Never true    Ran Out of Food in the Last Year: Never true  Transportation Needs: No Transportation Needs (12/13/2023)   PRAPARE - Administrator, Civil Service (Medical): No    Lack of Transportation (Non-Medical): No  Physical Activity: Inactive (12/13/2023)   Exercise Vital Sign    Days of Exercise per Week: 0 days    Minutes of Exercise per Session: Not on file  Stress: No Stress Concern Present (12/13/2023)   [employer] of Occupational Health - Occupational Stress Questionnaire    Feeling of Stress: Only a little  Social Connections: Moderately Isolated (12/13/2023)   Social Connection and Isolation Panel    Frequency of Communication with Friends and Family: Twice a week    Frequency of Social Gatherings with Friends and Family: Once a week    Attends Religious Services: Never    Database Administrator or Organizations: No    Attends Engineer, Structural: Not on file    Marital Status: Married  Catering Manager Violence: Not on file    Outpatient Medications Prior to Visit  Medication Sig Dispense Refill   aspirin-acetaminophen -caffeine (EXCEDRIN MIGRAINE) 250-250-65 MG tablet Take 1 tablet by mouth every 6 (six) hours as needed for headache.     calcium  carbonate (TUMS - DOSED IN MG ELEMENTAL CALCIUM ) 500 MG chewable tablet Chew 1 tablet by mouth daily as needed for indigestion or heartburn.     cetirizine  (ZYRTEC ) 10 MG tablet Take 1 tablet (10 mg total) by mouth daily. 30 tablet 11   Cholecalciferol (VITAMIN D3) 50 MCG (2000 UT) TABS Take by mouth.     hydrOXYzine  (ATARAX ) 10 MG tablet Take 1-2 tablets (10-20 mg total) by mouth every 8 (eight) hours as needed. 30 tablet 0   Norgestimate-Ethinyl Estradiol Triphasic 0.18/0.215/0.25 MG-35 MCG tablet Take 1 tablet by mouth daily.     rosuvastatin  (CRESTOR ) 5 MG tablet Take 1  tablet (5 mg total) by mouth at bedtime. 90 tablet 3   sertraline  (ZOLOFT ) 100 MG tablet Take 2 tablets (200 mg total) by mouth at bedtime. 180 tablet 2   topiramate  (TOPAMAX ) 25 MG tablet Take 1 tablet (25 mg total) by mouth daily. (Patient not taking: Reported on 11/08/2023) 30 tablet 0   Vitamin D , Ergocalciferol , (DRISDOL ) 1.25 MG (50000 UNIT) CAPS capsule Take 1 capsule (50,000 Units total) by mouth every 7 (seven) days. (Patient not taking: Reported on 11/08/2023) 5 capsule 0   No facility-administered medications prior to visit.    Allergies  Allergen Reactions   Ciprofloxacin Itching   Rocephin [Ceftriaxone] Itching  Penicillins Itching and Rash    Has patient had a PCN reaction causing immediate rash, facial/tongue/throat swelling, SOB or lightheadedness with hypotension: Unknown Has patient had a PCN reaction causing severe rash involving mucus membranes or skin necrosis: Unknown Has patient had a PCN reaction that required hospitalization Unknown Has patient had a PCN reaction occurring within the last 10 years: Yes If all of the above answers are NO, then may proceed with Cephalosporin use.     Review of Systems  Constitutional:  Negative for chills, fever and malaise/fatigue.  HENT:  Negative for congestion and hearing loss.   Eyes:  Negative for discharge.  Respiratory:  Negative for cough, sputum production and shortness of breath.   Cardiovascular:  Negative for chest pain, palpitations and leg swelling.  Gastrointestinal:  Negative for abdominal pain, blood in stool, constipation, diarrhea, heartburn, nausea and vomiting.  Genitourinary:  Negative for dysuria, frequency, hematuria and urgency.  Musculoskeletal:  Negative for back pain, falls and myalgias.  Skin:  Negative for rash.  Neurological:  Negative for dizziness, sensory change, loss of consciousness, weakness and headaches.  Endo/Heme/Allergies:  Negative for environmental allergies. Does not bruise/bleed  easily.  Psychiatric/Behavioral:  Negative for depression and suicidal ideas. The patient is not nervous/anxious and does not have insomnia.        Objective:    Physical Exam Constitutional:      General: She is not in acute distress.    Appearance: Normal appearance. She is obese. She is not ill-appearing or diaphoretic.  HENT:     Head: Normocephalic and atraumatic.     Right Ear: Tympanic membrane, ear canal and external ear normal.     Left Ear: Tympanic membrane, ear canal and external ear normal.     Nose: Nose normal.     Mouth/Throat:     Mouth: Mucous membranes are moist.     Pharynx: Oropharynx is clear. No oropharyngeal exudate.  Eyes:     General: No scleral icterus.       Right eye: No discharge.        Left eye: No discharge.     Conjunctiva/sclera: Conjunctivae normal.     Pupils: Pupils are equal, round, and reactive to light.  Neck:     Thyroid : No thyromegaly.  Cardiovascular:     Rate and Rhythm: Normal rate and regular rhythm.     Heart sounds: Normal heart sounds. No murmur heard. Pulmonary:     Effort: Pulmonary effort is normal. No respiratory distress.     Breath sounds: Normal breath sounds. No wheezing or rales.  Abdominal:     General: Bowel sounds are normal. There is no distension.     Palpations: Abdomen is soft. There is no mass.     Tenderness: There is no abdominal tenderness.  Musculoskeletal:        General: No tenderness. Normal range of motion.     Cervical back: Normal range of motion and neck supple.  Lymphadenopathy:     Cervical: No cervical adenopathy.  Skin:    General: Skin is warm and dry.     Findings: No rash.  Neurological:     General: No focal deficit present.     Mental Status: She is alert and oriented to person, place, and time.     Cranial Nerves: No cranial nerve deficit.     Coordination: Coordination normal.     Deep Tendon Reflexes: Reflexes are normal and symmetric. Reflexes normal.  Psychiatric:  Mood and Affect: Mood normal.        Behavior: Behavior normal.        Thought Content: Thought content normal.        Judgment: Judgment normal.     BP 126/82   Pulse 65   Temp 98 F (36.7 C)   Resp 16   Ht 5' 4 (1.626 m)   Wt 285 lb (129.3 kg)   SpO2 98%   BMI 48.92 kg/m  Wt Readings from Last 3 Encounters:  12/16/23 285 lb (129.3 kg)  11/08/23 287 lb 9.6 oz (130.5 kg)  04/01/23 275 lb (124.7 kg)    Diabetic Foot Exam - Simple   No data filed    Lab Results  Component Value Date   WBC 7.2 12/13/2022   HGB 14.2 12/13/2022   HCT 43.5 12/13/2022   PLT 311.0 12/13/2022   GLUCOSE 82 12/13/2022   CHOL 255 (H) 11/08/2023   TRIG 150 (H) 11/08/2023   HDL 81 11/08/2023   LDLCALC 148 (H) 11/08/2023   ALT 10 12/13/2022   AST 16 12/13/2022   NA 139 12/13/2022   K 5.0 12/13/2022   CL 101 12/13/2022   CREATININE 0.91 12/13/2022   BUN 18 12/13/2022   CO2 31 12/13/2022   TSH 2.78 12/13/2022   INR 0.9 05/12/2008   HGBA1C 5.5 12/13/2022    Lab Results  Component Value Date   TSH 2.78 12/13/2022   Lab Results  Component Value Date   WBC 7.2 12/13/2022   HGB 14.2 12/13/2022   HCT 43.5 12/13/2022   MCV 90.3 12/13/2022   PLT 311.0 12/13/2022   Lab Results  Component Value Date   NA 139 12/13/2022   K 5.0 12/13/2022   CO2 31 12/13/2022   GLUCOSE 82 12/13/2022   BUN 18 12/13/2022   CREATININE 0.91 12/13/2022   BILITOT 0.2 12/13/2022   ALKPHOS 103 12/13/2022   AST 16 12/13/2022   ALT 10 12/13/2022   PROT 7.3 12/13/2022   ALBUMIN 3.7 12/13/2022   CALCIUM  9.5 12/13/2022   ANIONGAP 10 04/06/2015   EGFR 97 07/04/2021   GFR 79.16 12/13/2022   Lab Results  Component Value Date   CHOL 255 (H) 11/08/2023   Lab Results  Component Value Date   HDL 81 11/08/2023   Lab Results  Component Value Date   LDLCALC 148 (H) 11/08/2023   Lab Results  Component Value Date   TRIG 150 (H) 11/08/2023   Lab Results  Component Value Date   CHOLHDL 3.1 11/08/2023    Lab Results  Component Value Date   HGBA1C 5.5 12/13/2022       Assessment & Plan:  Vitamin D  deficiency Assessment & Plan: Supplement and monitor   Orders: -     VITAMIN D  25 Hydroxy (Vit-D Deficiency, Fractures)  Preventative health care Assessment & Plan: Patient encouraged to maintain heart healthy diet, regular exercise, adequate sleep. Consider daily probiotics. Take medications as prescribed. Labs ordered and reviewed Pap at [facility] at [facility] will request  Orders: -     Lipid panel -     Comprehensive metabolic panel with GFR -     CBC with Differential/Platelet -     TSH -     VITAMIN D  25 Hydroxy (Vit-D Deficiency, Fractures) -     Hemoglobin A1c -     Vitamin B12  Hyperlipidemia, unspecified hyperlipidemia type Assessment & Plan: Encourage heart healthy diet such as MIND or  DASH diet, increase exercise, avoid trans fats, simple carbohydrates and processed foods, consider a krill or fish or flaxseed oil cap daily.    Orders: -     Lipid panel -     Comprehensive metabolic panel with GFR -     CBC with Differential/Platelet -     TSH  B12 deficiency Assessment & Plan: Supplement and monitor   Orders: -     Vitamin B12  Depression, unspecified depression type -     Ambulatory referral to Psychology  Anxiety disorder, unspecified type -     Ambulatory referral to Psychology  Obsessive compulsive personality disorder [facility]) -     Ambulatory referral to Psychology  Attention deficit -     Ambulatory referral to Psychology  Insulin  resistance -     Insulin , random  Need for influenza vaccination    Assessment and Plan Assessment & Plan Adult Wellness Visit Routine wellness visit with no significant changes in family history. Recent breast biopsy was normal. No issues with Pap smear or mammogram. Discussed flu and COVID vaccinations. - Administered flu shot today - Requested Pap smear and mammogram results from  [facility] - Advised to receive COVID vaccine at a pharmacy  Anxiety disorder and obsessive-compulsive disorder Ongoing anxiety and OCD symptoms with good and bad days. Current medication regimen is effective but anxiety attacks and driving OCD persist. Discussed potential overlap with ADHD and the possibility of ADHD contributing to anxiety and OCD symptoms. - Referred to behavioral health team for ADHD evaluation  Suspected attention-deficit/hyperactivity disorder (ADHD) Suspected ADHD due to difficulty with task completion and executive dysfunction. Discussed the overlap of ADHD with anxiety and OCD symptoms. Insurance requires formal evaluation for treatment coverage. - Referred to behavioral health team for ADHD evaluation  Overweight/obesity with insulin  resistance Morbid obesity with BMI of 45.0-49.9. Previous use of metformin  for borderline insulin  resistance. Discussed potential use of metformin  and GLP-1 agonists for weight loss. Consideration of GLP-1 agonists like Wegovy  or Mounjaro based on hemoglobin A1c results. Discussed potential side effects of GLP-1 agonists, including constipation, diarrhea, and abdominal pain. Emphasized the importance of diet and lifestyle modifications to minimize side effects. - Ordered hemoglobin A1c and insulin  levels - Will consider prescribing metformin  based on lab results - Will consider prescribing Wegovy  or Mounjaro based on hemoglobin A1c results - Advised on diet and lifestyle modifications to minimize side effects  Hyperlipidemia Managed with rosuvastatin . Cardiologist is comfortable transferring management to primary care. - Continue rosuvastatin  as prescribed  Vitamin D  deficiency Managed with over-the-counter vitamin D  supplementation. - Continue vitamin D  supplementation at 2000 IU daily  Deficiency of B group vitamins Previously on B12 supplementation, currently not taking it. Plan to recheck B12 levels to establish a new  baseline. - Ordered B12 level to establish new baseline  Recording duration: 27 minutes     Harlene Horton, MD

## 2023-12-17 LAB — LIPID PANEL
Cholesterol: 221 mg/dL — ABNORMAL HIGH (ref 0–200)
HDL: 89.6 mg/dL (ref >39.00)
LDL Cholesterol: 112 mg/dL — ABNORMAL HIGH (ref 0–99)
NonHDL: 131.55
Total CHOL/HDL Ratio: 2
Triglycerides: 97 mg/dL (ref 0.0–149.0)
VLDL: 19.4 mg/dL (ref 0.0–40.0)

## 2023-12-17 LAB — CBC WITH DIFFERENTIAL/PLATELET
Basophils Absolute: 0.1 K/uL (ref 0.0–0.1)
Basophils Relative: 1.1 % (ref 0.0–3.0)
Eosinophils Absolute: 0 K/uL (ref 0.0–0.7)
Eosinophils Relative: 0.5 % (ref 0.0–5.0)
HCT: 42 % (ref 36.0–46.0)
Hemoglobin: 14.1 g/dL (ref 12.0–15.0)
Lymphocytes Relative: 32.3 % (ref 12.0–46.0)
Lymphs Abs: 2.4 K/uL (ref 0.7–4.0)
MCHC: 33.5 g/dL (ref 30.0–36.0)
MCV: 88.4 fl (ref 78.0–100.0)
Monocytes Absolute: 0.6 K/uL (ref 0.1–1.0)
Monocytes Relative: 7.5 % (ref 3.0–12.0)
Neutro Abs: 4.4 K/uL (ref 1.4–7.7)
Neutrophils Relative %: 58.6 % (ref 43.0–77.0)
Platelets: 302 K/uL (ref 150.0–400.0)
RBC: 4.75 Mil/uL (ref 3.87–5.11)
RDW: 14.3 % (ref 11.5–15.5)
WBC: 7.5 K/uL (ref 4.0–10.5)

## 2023-12-17 LAB — COMPREHENSIVE METABOLIC PANEL WITH GFR
ALT: 13 U/L (ref 0–35)
AST: 18 U/L (ref 0–37)
Albumin: 3.8 g/dL (ref 3.5–5.2)
Alkaline Phosphatase: 97 U/L (ref 39–117)
BUN: 17 mg/dL (ref 6–23)
CO2: 30 meq/L (ref 19–32)
Calcium: 9.6 mg/dL (ref 8.4–10.5)
Chloride: 100 meq/L (ref 96–112)
Creatinine, Ser: 0.87 mg/dL (ref 0.40–1.20)
GFR: 82.96 mL/min (ref >60.00)
Glucose, Bld: 82 mg/dL (ref 70–99)
Potassium: 4.7 meq/L (ref 3.5–5.1)
Sodium: 137 meq/L (ref 135–145)
Total Bilirubin: 0.3 mg/dL (ref 0.2–1.2)
Total Protein: 7.3 g/dL (ref 6.0–8.3)

## 2023-12-17 LAB — HEMOGLOBIN A1C: Hgb A1c MFr Bld: 5.2 % (ref 4.6–6.5)

## 2023-12-17 LAB — VITAMIN D 25 HYDROXY (VIT D DEFICIENCY, FRACTURES): VITD: 37.24 ng/mL (ref 30.00–100.00)

## 2023-12-17 LAB — TSH: TSH: 2.43 u[IU]/mL (ref 0.35–5.50)

## 2023-12-17 LAB — VITAMIN B12: Vitamin B-12: 254 pg/mL (ref 211–911)

## 2023-12-18 LAB — INSULIN, RANDOM: Insulin: 7.5 u[IU]/mL

## 2023-12-22 ENCOUNTER — Ambulatory Visit: Payer: Self-pay | Admitting: Family Medicine

## 2023-12-25 ENCOUNTER — Other Ambulatory Visit: Payer: Self-pay | Admitting: Family Medicine

## 2023-12-25 MED ORDER — WEGOVY 0.25 MG/0.5ML ~~LOC~~ SOAJ: 0.2500 mg | SUBCUTANEOUS | 1 refills | Status: AC

## 2024-02-21 ENCOUNTER — Ambulatory Visit: Admitting: Psychology

## 2024-02-21 DIAGNOSIS — F89 Unspecified disorder of psychological development: Secondary | ICD-10-CM

## 2024-02-21 NOTE — Progress Notes (Signed)
 Date: 02/21/2024 Appointment Start Time: 10am Duration: 107 minutes Provider: Frederic Fire, PsyD Type of Session: Initial Appointment for Evaluation  Location of Patient: Home Location of Provider: Provider's Home (private office) Type of Contact: Caregility video visit with audio  Session Content:  Prior to proceeding with today's appointment, two pieces of identifying information were obtained from [place] to verify identity. In addition, Magdalena's physical location at the time of this appointment was obtained. In the event of technical difficulties, Makynleigh shared a phone number she could be reached at. Colleena and this provider participated in today's telepsychological service. Moya denied anyone else being present in the room or on the virtual appointment.  The provider's role was explained to [place]. The provider reviewed and discussed issues of confidentiality, privacy, and limits therein (e.g., reporting obligations). In addition to verbal informed consent, written informed consent for psychological services was obtained from [place] prior to the initial appointment. Written consent included information concerning the practice, financial arrangements, and confidentiality and patients' rights. Since the clinic is not a 24/7 crisis center, mental health emergency resources were shared, and the provider explained e-mail, voicemail, and/or other messaging systems should be utilized only for non-emergency reasons. This provider also explained that information obtained during appointments will be placed in their electronic medical record in a confidential manner. Nery verbally acknowledged understanding of the aforementioned and agreed to use mental health emergency resources discussed if needed. Moreover, Alisabeth agreed information may be shared with other [facility] or their referring provider(s) as needed for coordination of care. By signing the new patient documents, Samiyyah  provided written consent for coordination of care. Karishma verbally acknowledged understanding she is ultimately responsible for understanding her insurance benefits as it relates to reimbursement of telepsychological and in-person services. This provider also reviewed confidentiality, as it relates to telepsychological services, as well as the rationale for telepsychological services. This provider further explained that video should not be captured, photos should not be taken, nor should testing stimuli be copied or recorded as it would be a copyright violation. Saraih expressed understanding of the aforementioned, and verbally consented to proceed. Tashia is aware of the limitations of teleheath visits and verbally consented to proceed.  Marlissa completed the neurobehavioral examination, which included obtaining a, family, social, and psychiatric history; integration of prior history and other sources of clinical data to assist with clinical decision making; behavioral observations; assessment of thinking, reasoning, and judgment; and establishment of a provisional diagnosis. The evaluation was completed in 107 minutes. Codes 03883 and [id number] were billed.   Mental Status Examination:  Appearance:  neat Behavior: appropriate to circumstances Mood: neutral Affect: mood congruent Speech: fast Eye Contact: appropriate Psychomotor Activity: restless Thought Process: denies suicidal, homicidal, and self-harm ideation, plan and intent Content/Perceptual Disturbances: none Orientation: AAOx4 Cognition/Sensorium: intact Insight: fair Judgment: good  Provisional DSM-5 diagnosis(es):  F89 Unspecified Disorder of Psychological Development   Plan: Testing is expected to answer the question, does the individual meet criteria for ADHD when age, other mental health concerns (e.g., anxiety, OCD, and depression), and cognitive functioning are taken into consideration? Further testing is warranted because  a diagnosis cannot be given solely based on current interview data (further data is required). Testing results are expected to answer the remaining diagnostic questions in order to provide an accurate diagnosis and assist in treatment planning with an expectation of improved clinical outcome. Bora is currently scheduled for an appointment on 03/06/2024 at 7am via Caregility video visit with audio.  Frederic ONEIDA Fire, PsyD

## 2024-02-26 ENCOUNTER — Other Ambulatory Visit: Payer: Self-pay | Admitting: Family Medicine

## 2024-02-26 DIAGNOSIS — F418 Other specified anxiety disorders: Secondary | ICD-10-CM

## 2024-03-06 ENCOUNTER — Ambulatory Visit: Admitting: Psychology

## 2024-03-30 ENCOUNTER — Ambulatory Visit: Admitting: Family Medicine

## 2024-04-29 IMAGING — MR MR ABDOMEN WO/W CM
16 of 26 series · 26 of 48 positions shown · IV contrast (10ML GADAVIST)
Comparison: Ultrasound June 05, 2021

CLINICAL DATA: Further characterization of hepatic lesions seen on
prior ultrasound.

EXAM:
MRI ABDOMEN WITHOUT AND WITH CONTRAST
TECHNIQUE: Multiplanar multisequence MR imaging of the abdomen was performed
both before and after the administration of intravenous contrast.
CONTRAST:  10mL GADAVIST GADOBUTROL 1 MMOL/ML IV SOLN

[Series 5: T2 · coronal · 7.0mm · 1.64mm/px · 1 of 38 slices shown (1 of 2)]
[im 1/38]
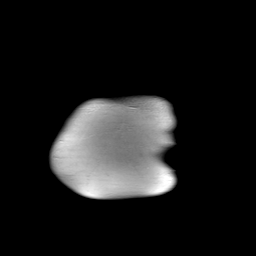

[Series 6: T2 fat-sat · axial · 6.0mm · 1.48mm/px · 1 of 36 slices shown]
[im 1/36]
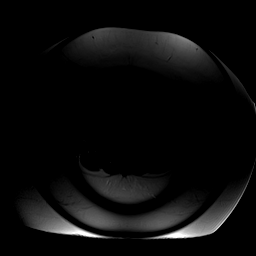

[Series 7: acr-in + out · axial · 6.0mm · 0.74mm/px · 1 of 72 slices shown (1 of 2)]
[im 1/72]
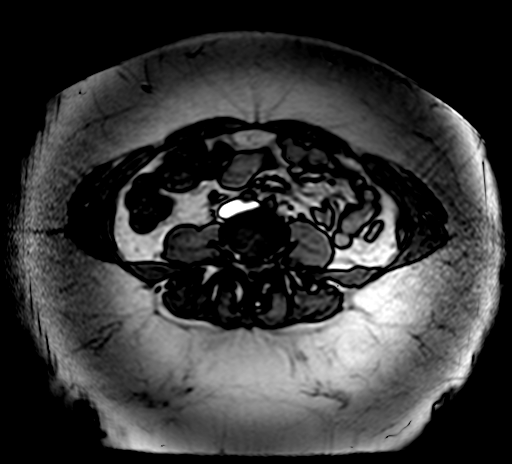

[Series 8: [id number] free breathing · axial · 6.0mm · 1.98mm/px · z∈[-74,+204]mm · 3 of 114 slices shown]
[im 1/114]
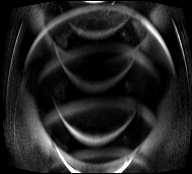
[im 57/114]
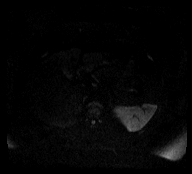
[im 114/114]
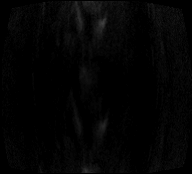

[Series 9: [id number] free breathing_adc · axial · 6.0mm · 1.98mm/px · 1 of 38 slices shown]
[im 1/38]
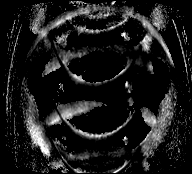

[Series 10: DWI · axial · 6.0mm · 0.74mm/px · 1 of 36 slices shown]
[im 1/36]
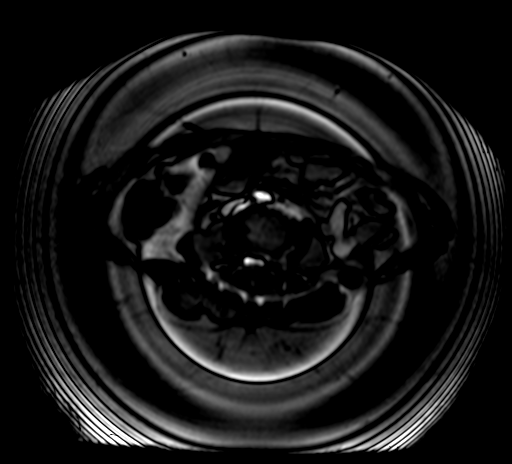

[Series 11: acr-in + out · axial · 6.0mm · 0.74mm/px · 1 of 72 slices shown (2 of 2)]
[im 1/72]
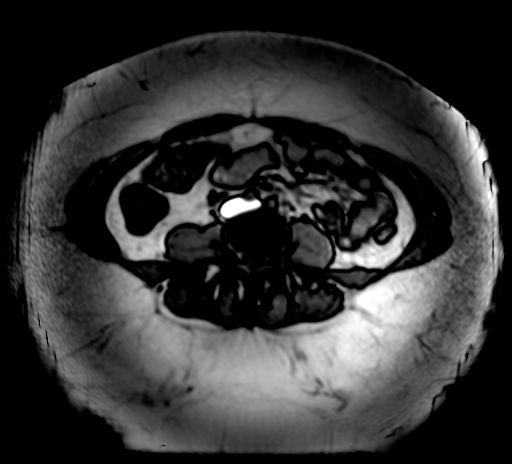

[Series 12: T1 dynamic · axial · non-contrast · 4.4mm · 0.80mm/px · z∈[-76,+166]mm · 2 of 56 slices shown (1 of 4)]
[im 1/56]
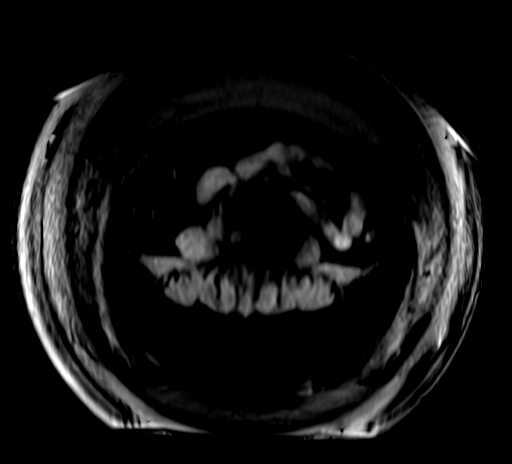
[im 56/56]
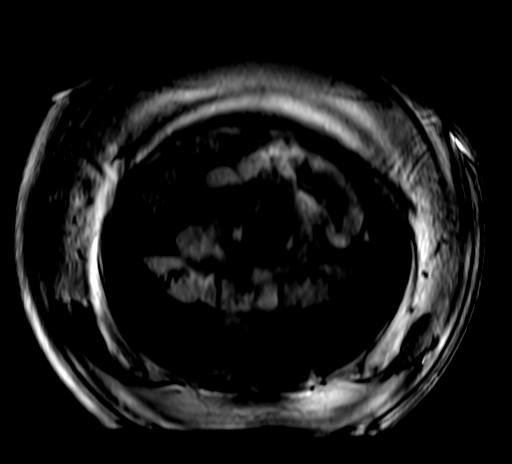

[Series 13: T1 dynamic post-contrast · axial · delayed · 4.4mm · 0.80mm/px · z∈[-76,+166]mm · 2 of 56 slices shown (1 of 4)]
[im 1/56]
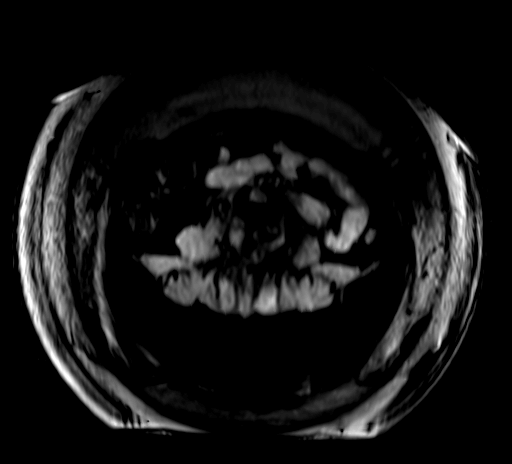
[im 56/56]
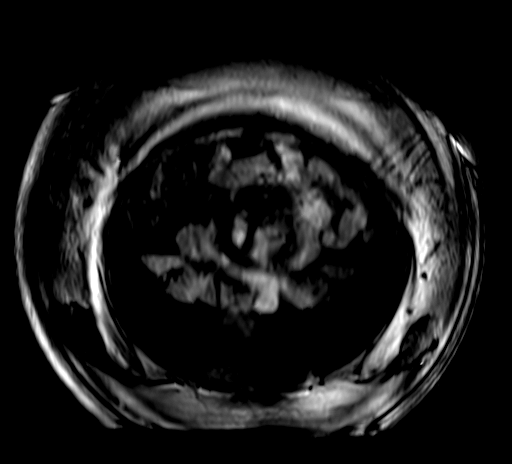

[Series 14: T1 dynamic · axial · delayed · 4.4mm · 0.80mm/px · z∈[-76,+166]mm · 2 of 56 slices shown (2 of 4)]
[im 1/56]
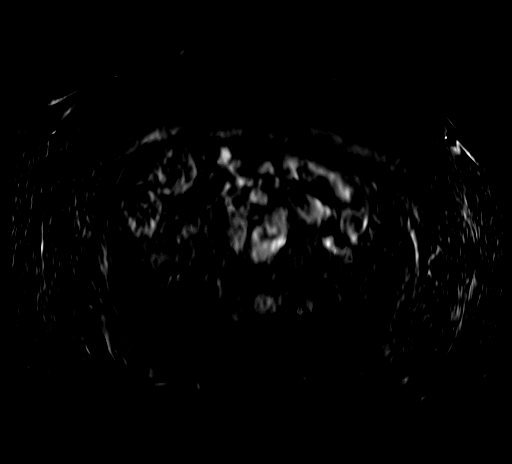
[im 56/56]
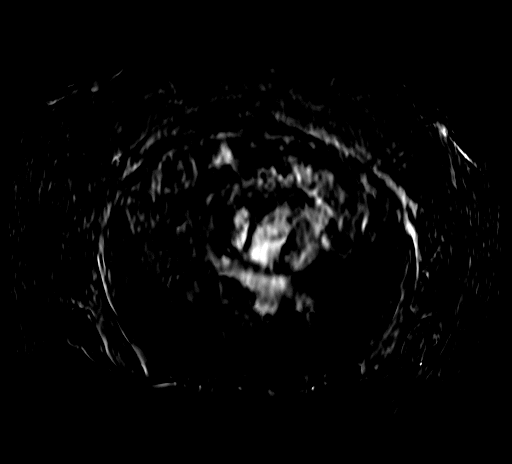

[Series 15: T1 dynamic post-contrast · axial · delayed · 4.4mm · 0.80mm/px · z∈[-76,+166]mm · 2 of 56 slices shown (2 of 4)]
[im 1/56]
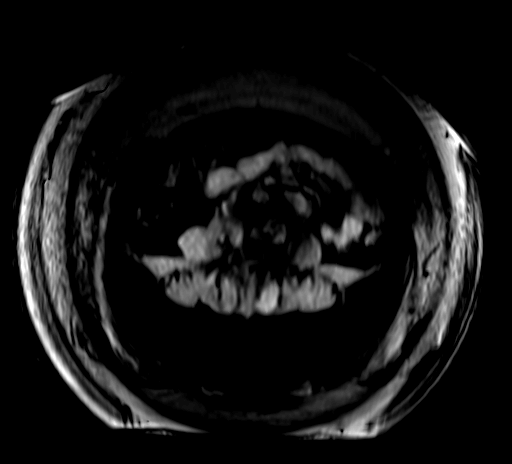
[im 56/56]
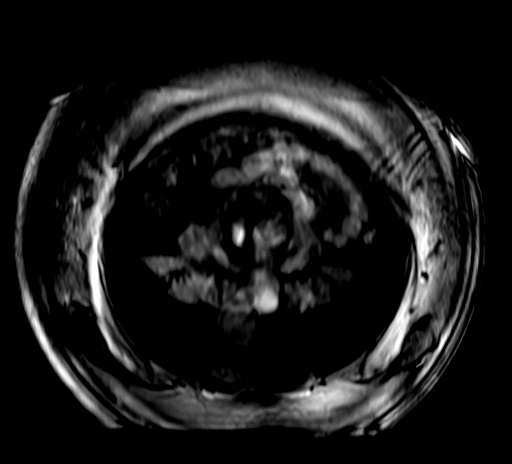

[Series 16: T1 dynamic · axial · delayed · 4.4mm · 0.80mm/px · z∈[-76,+166]mm · 2 of 56 slices shown (3 of 4)]
[im 1/56]
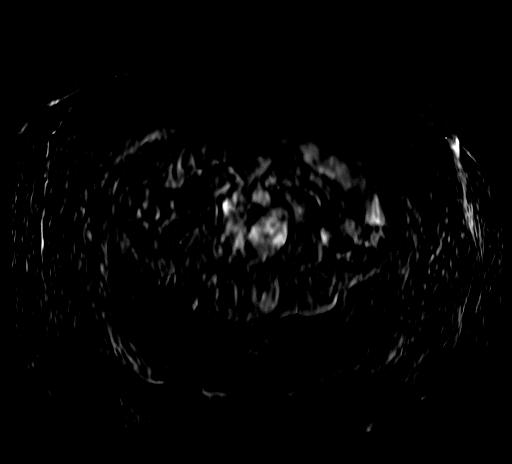
[im 56/56]
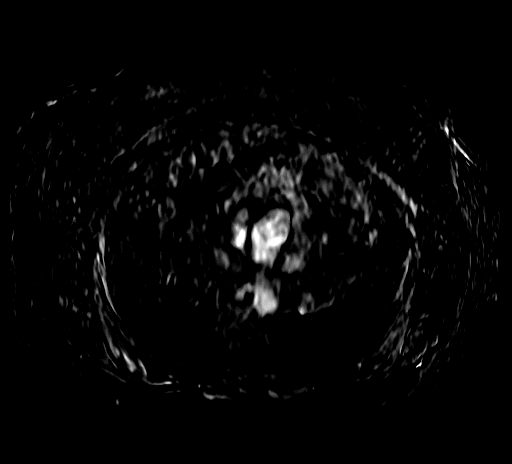

[Series 17: T1 dynamic post-contrast · axial · delayed · 4.4mm · 0.80mm/px · z∈[-76,+166]mm · 2 of 56 slices shown (3 of 4)]
[im 1/56]
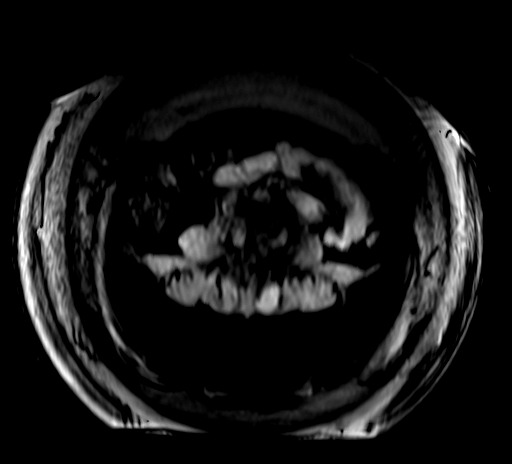
[im 56/56]
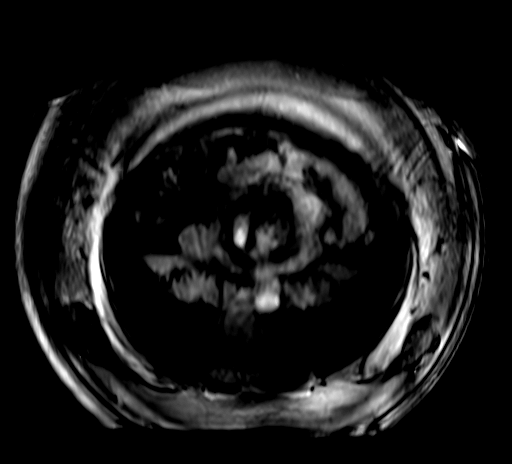

[Series 18: T1 dynamic · axial · delayed · 4.4mm · 0.80mm/px · z∈[-76,+166]mm · 2 of 56 slices shown (4 of 4)]
[im 1/56]
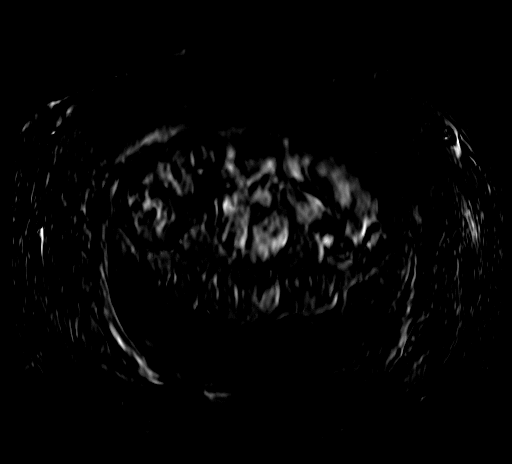
[im 56/56]
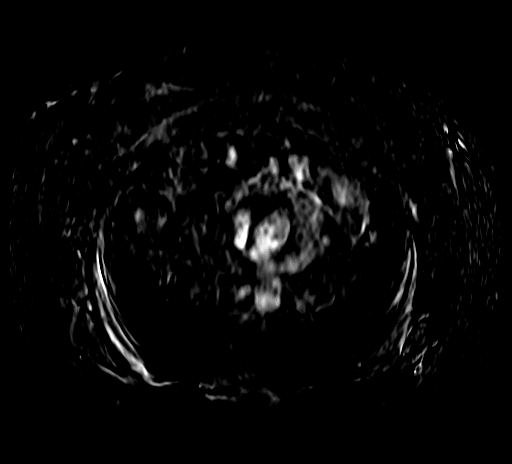

[Series 20: T2 · axial · 6.0mm · 1.48mm/px · 1 of 36 slices shown (2 of 2)]
[im 1/36]
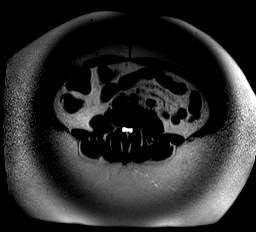

[Series 21: T1 dynamic post-contrast · axial · delayed · 4.4mm · 0.80mm/px · z∈[-76,+166]mm · 2 of 56 slices shown (4 of 4)]
[im 1/56]
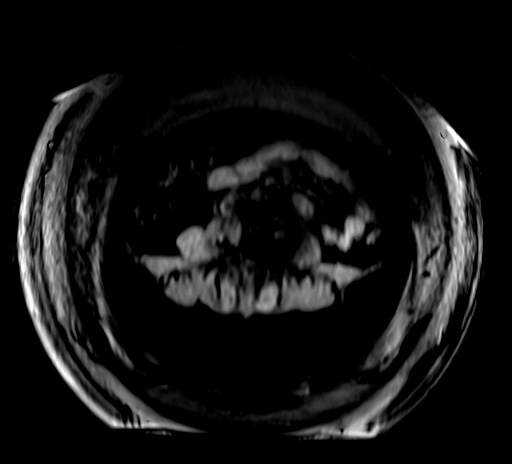
[im 56/56]
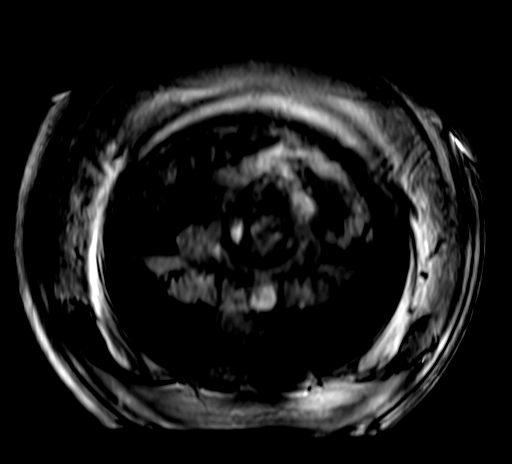

[26 of 48 positions shown; findings below may reference images not displayed]

FINDINGS: Lower chest: No acute abnormality.

Hepatobiliary: No significant hepatic steatosis. No morphologic
changes of cirrhosis identified.

Arterially enhancing solid lesion in the inferior right lobe of the
liver measures 4.0 x 3.5 cm on image 39/13, and demonstrates mild
intrinsic T1 hypointensity and minimal T2 hyperintensity. On delayed
postcontrast sequences the lesion remains slightly hyperintense to
background liver, with signal intensity less than that of background
aorta. No additional hepatic lesions identified.

Gallbladder is unremarkable.  No biliary ductal dilation.

Pancreas: No pancreatic ductal dilation or evidence of acute
inflammation. No cystic or solid hyperenhancing pancreatic lesion
identified.

Spleen:  No splenomegaly or suspicious splenic lesion.

Adrenals/Urinary Tract: Bilateral adrenal glands appear normal. No
hydronephrosis. No solid enhancing renal mass.

Stomach/Bowel: Visualized portions within the abdomen are
unremarkable.

Vascular/Lymphatic: Normal caliber aorta. The portal, splenic and
superior mesenteric veins are patent. No pathologically enlarged
abdominal lymph nodes.

Other:  No abdominal free fluid.

Musculoskeletal: No suspicious bone lesions identified.
IMPRESSION: 1. Arterially enhancing 4 cm lesion in the inferior right lobe of
the liver demonstrates nonspecific imaging characteristics but which
is almost certainly benign, most likely reflecting focal nodular
hyperplasia or a benign hepatic adenoma. Would suggest follow-up
hepatic protocol MRI with and without EOVIST contrast in 3 months to
assess stability and for more definitive characterization.
2. No acute abnormality in the abdomen.

## 2024-12-21 ENCOUNTER — Encounter: Admitting: Family Medicine
